# Patient Record
Sex: Female | Born: 1955
Health system: Southern US, Community
[De-identification: ages and names within clinical notes are randomized; demographics above are authoritative.]

## PROBLEM LIST (undated history)

## (undated) DIAGNOSIS — I639 Cerebral infarction, unspecified: Secondary | ICD-10-CM

## (undated) DIAGNOSIS — I2510 Atherosclerotic heart disease of native coronary artery without angina pectoris: Secondary | ICD-10-CM

## (undated) DIAGNOSIS — E7800 Pure hypercholesterolemia, unspecified: Secondary | ICD-10-CM

## (undated) DIAGNOSIS — R531 Weakness: Secondary | ICD-10-CM

## (undated) DIAGNOSIS — E119 Type 2 diabetes mellitus without complications: Secondary | ICD-10-CM

## (undated) DIAGNOSIS — F419 Anxiety disorder, unspecified: Secondary | ICD-10-CM

## (undated) DIAGNOSIS — I359 Nonrheumatic aortic valve disorder, unspecified: Secondary | ICD-10-CM

## (undated) DIAGNOSIS — I10 Essential (primary) hypertension: Secondary | ICD-10-CM

---

## 2007-02-24 HISTORY — PX: AORTIC VALVE REPLACEMENT (AVR)/CORONARY ARTERY BYPASS GRAFTING (CABG): SHX5725

## 2010-09-30 DIAGNOSIS — F32A Depression, unspecified: Secondary | ICD-10-CM | POA: Insufficient documentation

## 2010-09-30 DIAGNOSIS — D860 Sarcoidosis of lung: Secondary | ICD-10-CM | POA: Insufficient documentation

## 2010-10-01 DIAGNOSIS — I2510 Atherosclerotic heart disease of native coronary artery without angina pectoris: Secondary | ICD-10-CM | POA: Insufficient documentation

## 2010-10-01 DIAGNOSIS — H269 Unspecified cataract: Secondary | ICD-10-CM | POA: Insufficient documentation

## 2010-10-01 DIAGNOSIS — E785 Hyperlipidemia, unspecified: Secondary | ICD-10-CM | POA: Insufficient documentation

## 2011-06-17 DIAGNOSIS — K224 Dyskinesia of esophagus: Secondary | ICD-10-CM | POA: Insufficient documentation

## 2011-06-17 DIAGNOSIS — E11319 Type 2 diabetes mellitus with unspecified diabetic retinopathy without macular edema: Secondary | ICD-10-CM | POA: Insufficient documentation

## 2012-02-29 DIAGNOSIS — M19041 Primary osteoarthritis, right hand: Secondary | ICD-10-CM | POA: Insufficient documentation

## 2012-11-26 DIAGNOSIS — E669 Obesity, unspecified: Secondary | ICD-10-CM | POA: Insufficient documentation

## 2014-03-28 DIAGNOSIS — I5022 Chronic systolic (congestive) heart failure: Secondary | ICD-10-CM | POA: Insufficient documentation

## 2014-04-15 DIAGNOSIS — D509 Iron deficiency anemia, unspecified: Secondary | ICD-10-CM | POA: Insufficient documentation

## 2016-01-09 DIAGNOSIS — N1831 Chronic kidney disease, stage 3a: Secondary | ICD-10-CM | POA: Insufficient documentation

## 2016-01-09 DIAGNOSIS — N183 Chronic kidney disease, stage 3 unspecified: Secondary | ICD-10-CM | POA: Insufficient documentation

## 2020-01-24 DIAGNOSIS — I639 Cerebral infarction, unspecified: Secondary | ICD-10-CM

## 2020-01-24 HISTORY — DX: Cerebral infarction, unspecified: I63.9

## 2020-02-01 ENCOUNTER — Emergency Department: Payer: BLUE CROSS/BLUE SHIELD

## 2020-02-01 ENCOUNTER — Other Ambulatory Visit: Payer: Self-pay

## 2020-02-01 DIAGNOSIS — Z8249 Family history of ischemic heart disease and other diseases of the circulatory system: Secondary | ICD-10-CM

## 2020-02-01 DIAGNOSIS — R29810 Facial weakness: Secondary | ICD-10-CM | POA: Diagnosis present

## 2020-02-01 DIAGNOSIS — Z794 Long term (current) use of insulin: Secondary | ICD-10-CM

## 2020-02-01 DIAGNOSIS — F32A Depression, unspecified: Secondary | ICD-10-CM | POA: Diagnosis present

## 2020-02-01 DIAGNOSIS — Z7982 Long term (current) use of aspirin: Secondary | ICD-10-CM

## 2020-02-01 DIAGNOSIS — Z79899 Other long term (current) drug therapy: Secondary | ICD-10-CM

## 2020-02-01 DIAGNOSIS — E860 Dehydration: Secondary | ICD-10-CM | POA: Diagnosis present

## 2020-02-01 DIAGNOSIS — I2510 Atherosclerotic heart disease of native coronary artery without angina pectoris: Secondary | ICD-10-CM | POA: Diagnosis present

## 2020-02-01 DIAGNOSIS — E7800 Pure hypercholesterolemia, unspecified: Secondary | ICD-10-CM | POA: Diagnosis present

## 2020-02-01 DIAGNOSIS — E119 Type 2 diabetes mellitus without complications: Secondary | ICD-10-CM | POA: Diagnosis present

## 2020-02-01 DIAGNOSIS — I672 Cerebral atherosclerosis: Secondary | ICD-10-CM | POA: Diagnosis present

## 2020-02-01 DIAGNOSIS — I6521 Occlusion and stenosis of right carotid artery: Secondary | ICD-10-CM | POA: Diagnosis present

## 2020-02-01 DIAGNOSIS — D869 Sarcoidosis, unspecified: Secondary | ICD-10-CM | POA: Diagnosis present

## 2020-02-01 DIAGNOSIS — I639 Cerebral infarction, unspecified: Secondary | ICD-10-CM | POA: Diagnosis not present

## 2020-02-01 DIAGNOSIS — Z952 Presence of prosthetic heart valve: Secondary | ICD-10-CM

## 2020-02-01 DIAGNOSIS — E785 Hyperlipidemia, unspecified: Secondary | ICD-10-CM | POA: Diagnosis present

## 2020-02-01 DIAGNOSIS — I119 Hypertensive heart disease without heart failure: Secondary | ICD-10-CM | POA: Diagnosis present

## 2020-02-01 DIAGNOSIS — R29701 NIHSS score 1: Secondary | ICD-10-CM | POA: Diagnosis present

## 2020-02-01 DIAGNOSIS — Z20822 Contact with and (suspected) exposure to covid-19: Secondary | ICD-10-CM | POA: Diagnosis present

## 2020-02-01 DIAGNOSIS — N390 Urinary tract infection, site not specified: Secondary | ICD-10-CM | POA: Diagnosis present

## 2020-02-01 DIAGNOSIS — Z888 Allergy status to other drugs, medicaments and biological substances status: Secondary | ICD-10-CM

## 2020-02-01 DIAGNOSIS — Z951 Presence of aortocoronary bypass graft: Secondary | ICD-10-CM

## 2020-02-01 DIAGNOSIS — N179 Acute kidney failure, unspecified: Secondary | ICD-10-CM | POA: Diagnosis present

## 2020-02-01 DIAGNOSIS — G8324 Monoplegia of upper limb affecting left nondominant side: Secondary | ICD-10-CM | POA: Diagnosis present

## 2020-02-01 IMAGING — CR DG CHEST 1V
1 series · 1 of 1 positions shown · non-contrast
Comparison: None.

CLINICAL DATA: Weakness

EXAM:
CHEST  1 VIEW

[chest ap]
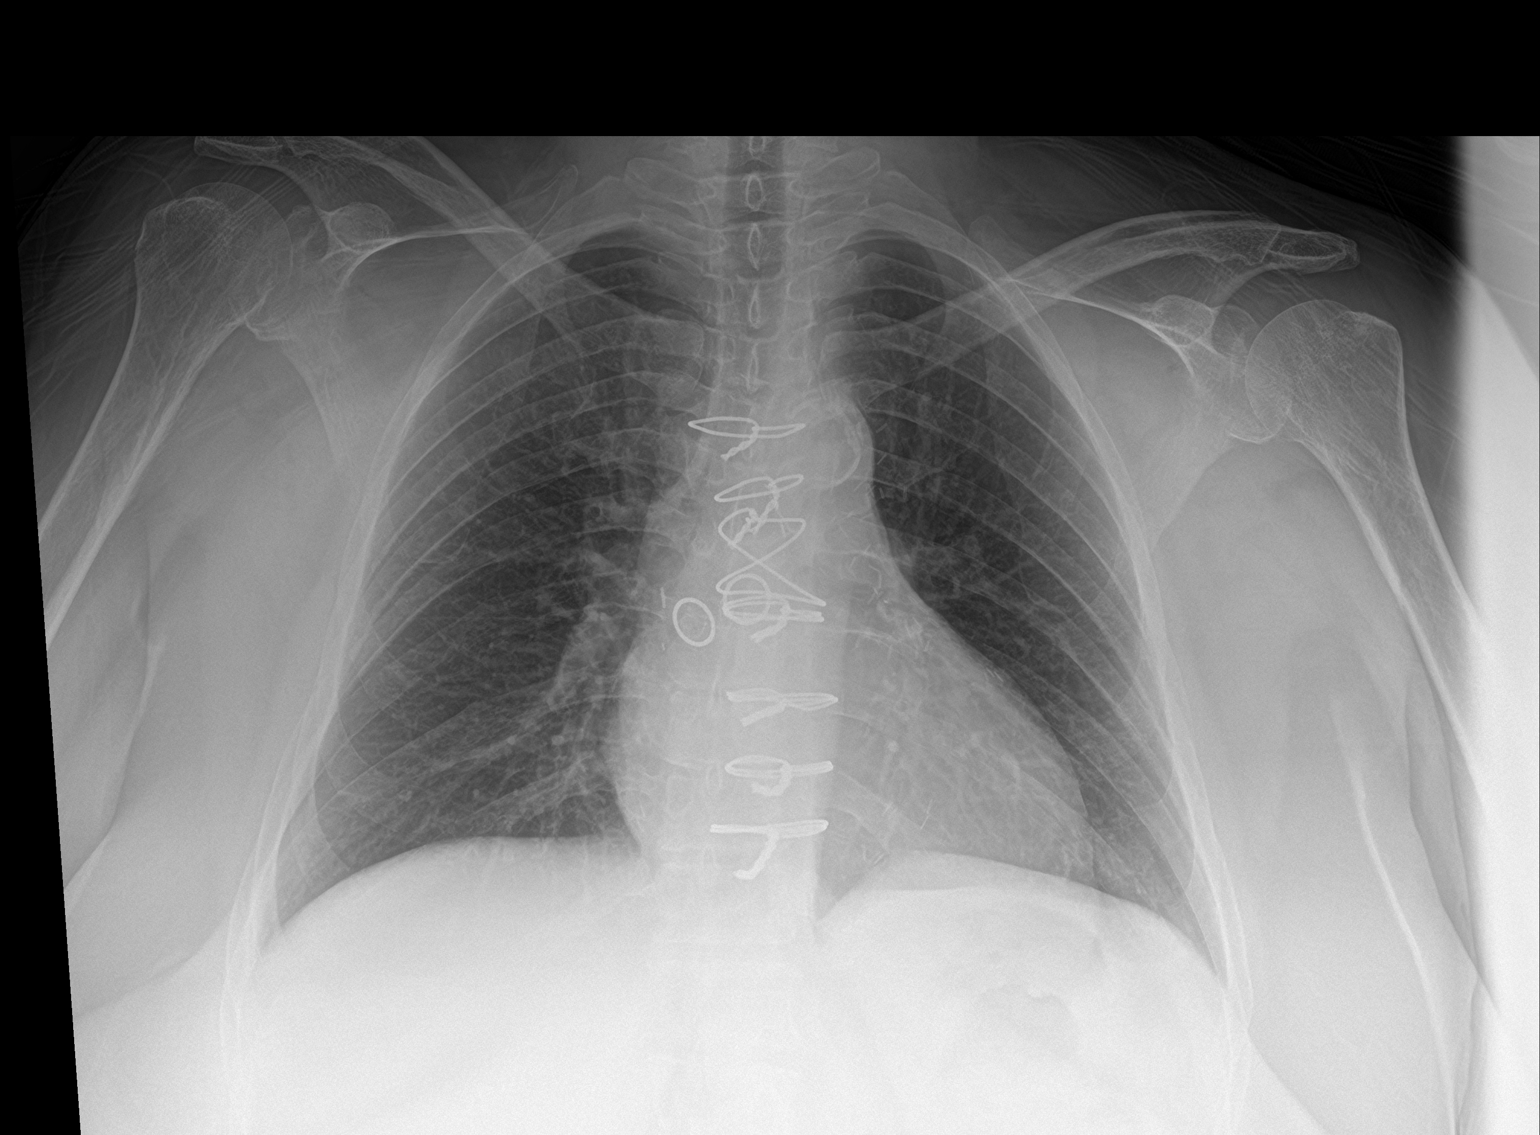

[1 of 1 positions shown; findings below may reference images not displayed]

FINDINGS: The heart size and mediastinal contours are within normal limits.
Aortic knob calcifications are seen. Overlying median sternotomy
wires are present. Both lungs are clear. The visualized skeletal
structures are unremarkable.
IMPRESSION: No active disease.

## 2020-02-01 MED ORDER — SODIUM CHLORIDE 0.9% FLUSH
3.0000 mL | Freq: Once | INTRAVENOUS | Status: DC
Start: 2020-02-01 — End: 2020-02-06

## 2020-02-01 NOTE — ED Triage Notes (Signed)
EMS brings pt in from home for c/o weakness and tingling to left arm since yesterday

## 2020-02-01 NOTE — ED Triage Notes (Signed)
PT to ED via EMS from home. C/o L arm tingling and weakness since yesterday morning. PT is alert and oriented, speech clear. No leg drop, but L arm drops with gravity and grip is decreased. No HX of same.

## 2020-02-02 ENCOUNTER — Encounter: Payer: Self-pay | Admitting: Internal Medicine

## 2020-02-02 ENCOUNTER — Emergency Department: Payer: BLUE CROSS/BLUE SHIELD

## 2020-02-02 ENCOUNTER — Observation Stay: Payer: BLUE CROSS/BLUE SHIELD

## 2020-02-02 ENCOUNTER — Inpatient Hospital Stay
Admission: EM | Admit: 2020-02-02 | Discharge: 2020-02-06 | DRG: 065 | Disposition: A | Discharge status: Home / Self Care | Payer: BLUE CROSS/BLUE SHIELD | Attending: Internal Medicine | Admitting: Internal Medicine

## 2020-02-02 DIAGNOSIS — N179 Acute kidney failure, unspecified: Secondary | ICD-10-CM | POA: Diagnosis not present

## 2020-02-02 DIAGNOSIS — N390 Urinary tract infection, site not specified: Secondary | ICD-10-CM

## 2020-02-02 DIAGNOSIS — E119 Type 2 diabetes mellitus without complications: Secondary | ICD-10-CM

## 2020-02-02 DIAGNOSIS — I639 Cerebral infarction, unspecified: Secondary | ICD-10-CM | POA: Diagnosis present

## 2020-02-02 DIAGNOSIS — I779 Disorder of arteries and arterioles, unspecified: Secondary | ICD-10-CM

## 2020-02-02 DIAGNOSIS — G459 Transient cerebral ischemic attack, unspecified: Secondary | ICD-10-CM | POA: Diagnosis present

## 2020-02-02 DIAGNOSIS — R531 Weakness: Secondary | ICD-10-CM

## 2020-02-02 DIAGNOSIS — I10 Essential (primary) hypertension: Secondary | ICD-10-CM | POA: Diagnosis present

## 2020-02-02 DIAGNOSIS — E860 Dehydration: Secondary | ICD-10-CM

## 2020-02-02 HISTORY — DX: Type 2 diabetes mellitus without complications: E11.9

## 2020-02-02 HISTORY — DX: Essential (primary) hypertension: I10

## 2020-02-02 HISTORY — DX: Pure hypercholesterolemia, unspecified: E78.00

## 2020-02-02 LAB — COMPREHENSIVE METABOLIC PANEL
ALT: 21 U/L (ref 0–44)
AST: 23 U/L (ref 15–41)
Albumin: 4.1 g/dL (ref 3.5–5.0)
Alkaline Phosphatase: 76 U/L (ref 38–126)
Anion gap: 11 (ref 5–15)
BUN: 39 mg/dL — ABNORMAL HIGH (ref 8–23)
CO2: 25 mmol/L (ref 22–32)
Calcium: 9.4 mg/dL (ref 8.9–10.3)
Chloride: 99 mmol/L (ref 98–111)
Creatinine, Ser: 2.42 mg/dL — ABNORMAL HIGH (ref 0.44–1.00)
GFR, Estimated: 22 mL/min — ABNORMAL LOW (ref >60)
Glucose, Bld: 155 mg/dL — ABNORMAL HIGH (ref 70–99)
Potassium: 4.5 mmol/L (ref 3.5–5.1)
Sodium: 135 mmol/L (ref 135–145)
Total Bilirubin: 0.5 mg/dL (ref 0.3–1.2)
Total Protein: 8 g/dL (ref 6.5–8.1)

## 2020-02-02 LAB — URINALYSIS, COMPLETE (UACMP) WITH MICROSCOPIC
Bilirubin Urine: NEGATIVE
Glucose, UA: 50 mg/dL — AB
Ketones, ur: NEGATIVE mg/dL
Nitrite: POSITIVE — AB
Protein, ur: NEGATIVE mg/dL
Specific Gravity, Urine: 1.009 (ref 1.005–1.030)
pH: 6 (ref 5.0–8.0)

## 2020-02-02 LAB — DIFFERENTIAL
Abs Immature Granulocytes: 0.01 10*3/uL (ref 0.00–0.07)
Basophils Absolute: 0.1 10*3/uL (ref 0.0–0.1)
Basophils Relative: 1 %
Eosinophils Absolute: 0.1 10*3/uL (ref 0.0–0.5)
Eosinophils Relative: 1 %
Immature Granulocytes: 0 %
Lymphocytes Relative: 25 %
Lymphs Abs: 1.6 10*3/uL (ref 0.7–4.0)
Monocytes Absolute: 0.6 10*3/uL (ref 0.1–1.0)
Monocytes Relative: 10 %
Neutro Abs: 3.9 10*3/uL (ref 1.7–7.7)
Neutrophils Relative %: 63 %

## 2020-02-02 LAB — CBC
HCT: 38.1 % (ref 36.0–46.0)
Hemoglobin: 12.6 g/dL (ref 12.0–15.0)
MCH: 26.3 pg (ref 26.0–34.0)
MCHC: 33.1 g/dL (ref 30.0–36.0)
MCV: 79.5 fL — ABNORMAL LOW (ref 80.0–100.0)
Platelets: 331 10*3/uL (ref 150–400)
RBC: 4.79 MIL/uL (ref 3.87–5.11)
RDW: 12.6 % (ref 11.5–15.5)
WBC: 6.3 10*3/uL (ref 4.0–10.5)
nRBC: 0 % (ref 0.0–0.2)

## 2020-02-02 LAB — GLUCOSE, CAPILLARY
Glucose-Capillary: 135 mg/dL — ABNORMAL HIGH (ref 70–99)
Glucose-Capillary: 104 mg/dL — ABNORMAL HIGH (ref 70–99)

## 2020-02-02 LAB — RESP PANEL BY RT-PCR (FLU A&B, COVID) ARPGX2
Influenza A by PCR: NEGATIVE
Influenza B by PCR: NEGATIVE
SARS Coronavirus 2 by RT PCR: NEGATIVE

## 2020-02-02 LAB — HEMOGLOBIN A1C
Hgb A1c MFr Bld: 9.4 % — ABNORMAL HIGH (ref 4.8–5.6)
Mean Plasma Glucose: 223.08 mg/dL

## 2020-02-02 LAB — CBG MONITORING, ED: Glucose-Capillary: 122 mg/dL — ABNORMAL HIGH (ref 70–99)

## 2020-02-02 LAB — APTT: aPTT: 31 seconds (ref 24–36)

## 2020-02-02 LAB — HIV ANTIBODY (ROUTINE TESTING W REFLEX): HIV Screen 4th Generation wRfx: NONREACTIVE

## 2020-02-02 LAB — PROTIME-INR
INR: 1 (ref 0.8–1.2)
Prothrombin Time: 13.1 seconds (ref 11.4–15.2)

## 2020-02-02 LAB — TROPONIN I (HIGH SENSITIVITY): Troponin I (High Sensitivity): 7 ng/L (ref <18)

## 2020-02-02 IMAGING — US US CAROTID DUPLEX BILAT
1 series · 13 of 24 positions shown · non-contrast
Comparison: None.

CLINICAL DATA: Carotid atherosclerosis, hypertension,
hyperlipidemia and diabetes

EXAM:
BILATERAL CAROTID DUPLEX ULTRASOUND
TECHNIQUE: Gray scale imaging, color Doppler and duplex ultrasound were
performed of bilateral carotid and vertebral arteries in the neck.

[Series 1: us carotid bilateral · 13 of 69 slices shown]
[im 1/69]
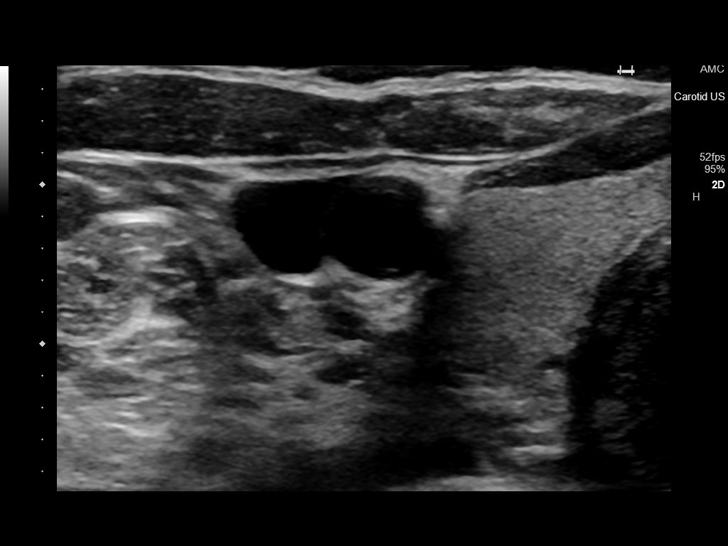
[im 6/69]
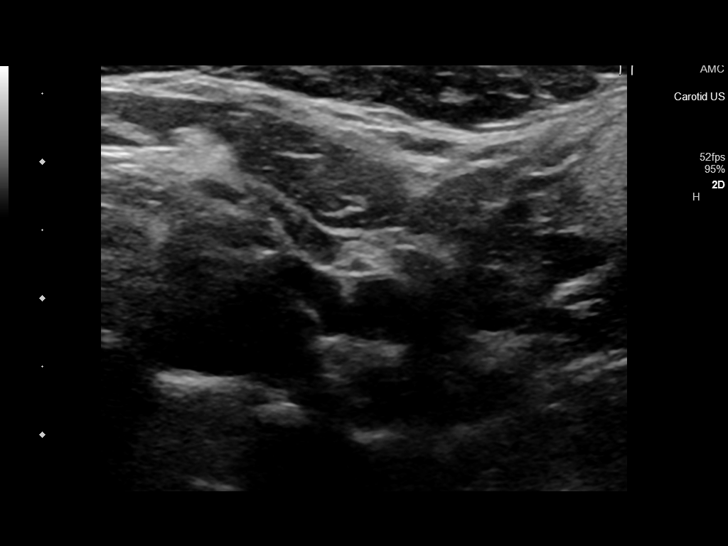
[im 12/69]
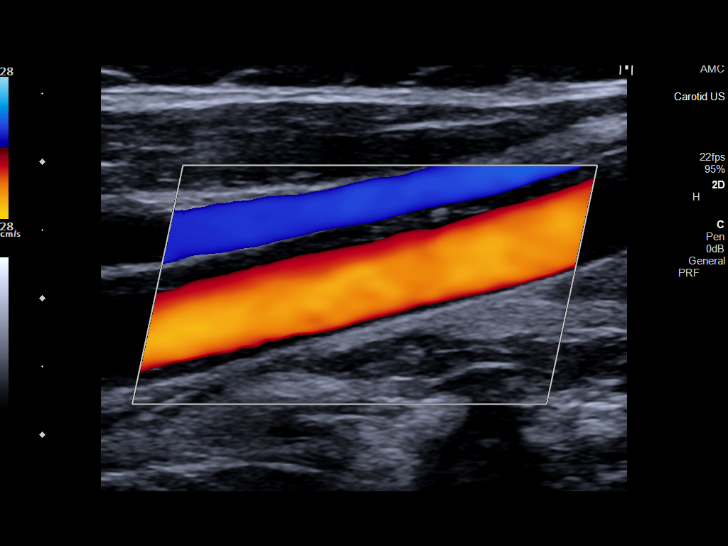
[im 18/69]
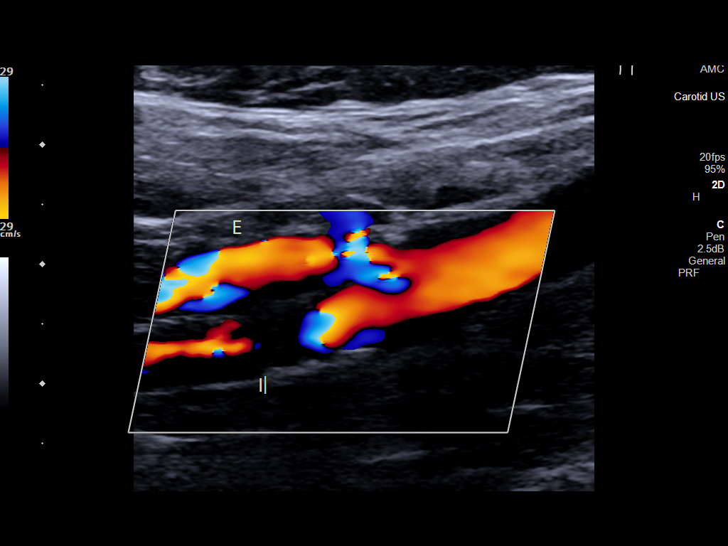
[im 24/69]
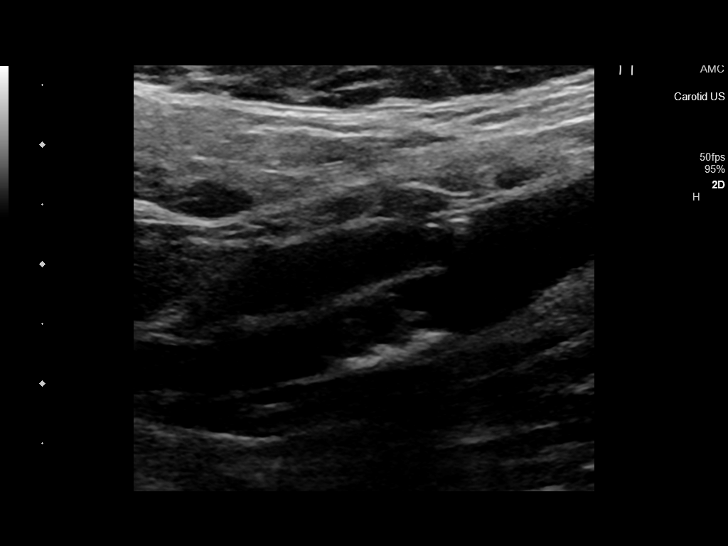
[im 30/69]
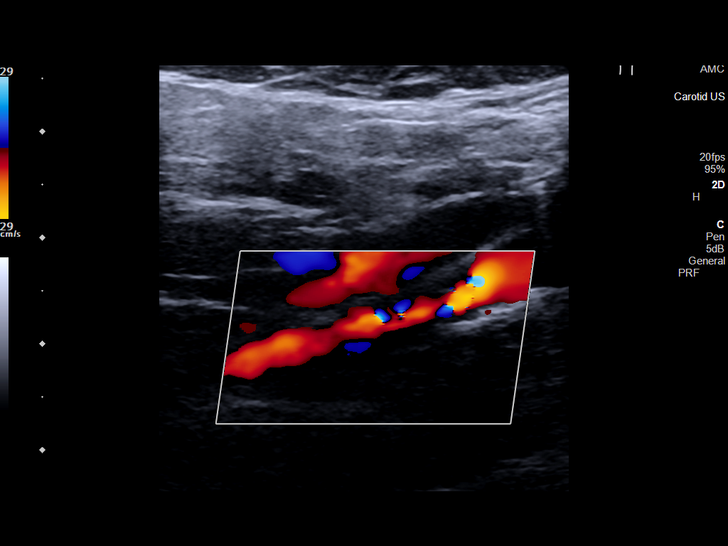
[im 36/69]
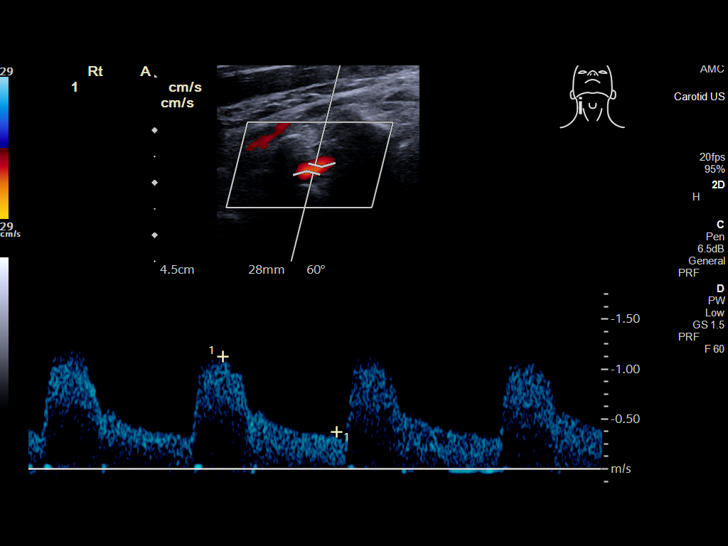
[im 39/69]
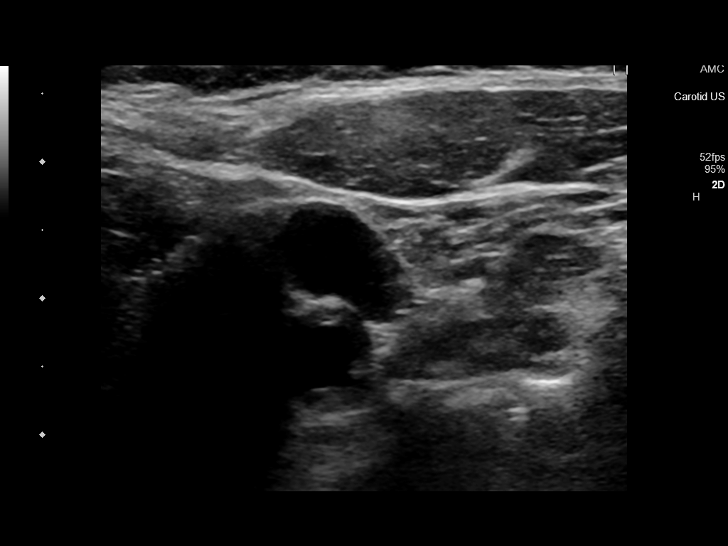
[im 45/69]
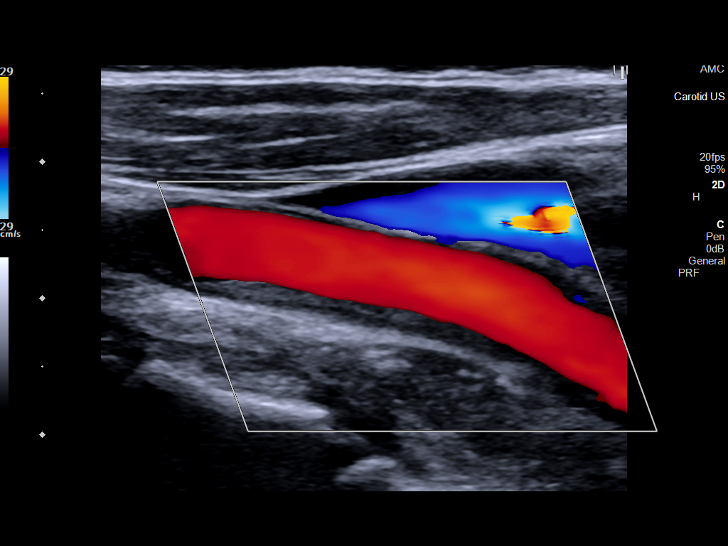
[im 51/69]
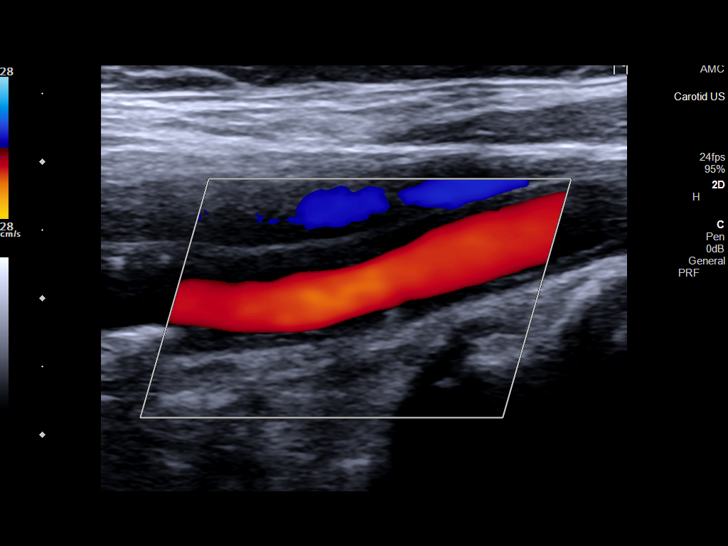
[im 57/69]
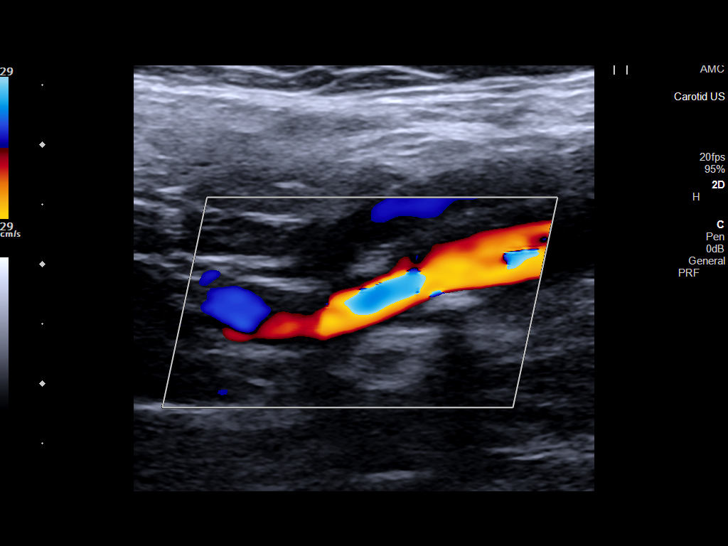
[im 63/69]
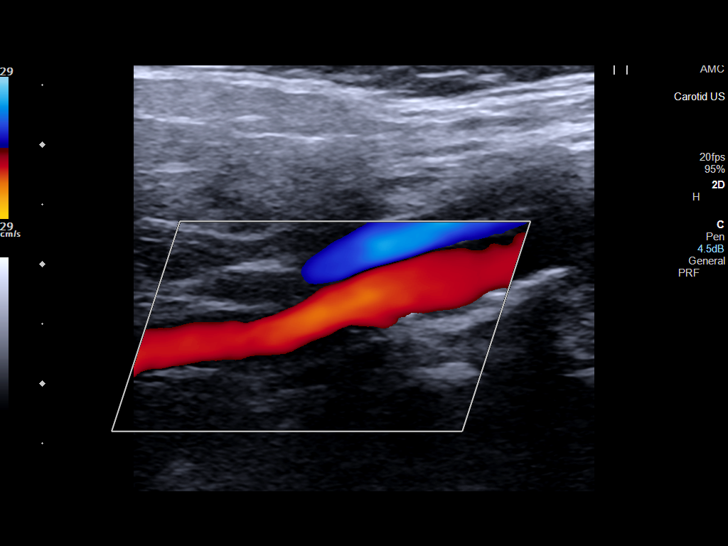
[im 69/69]
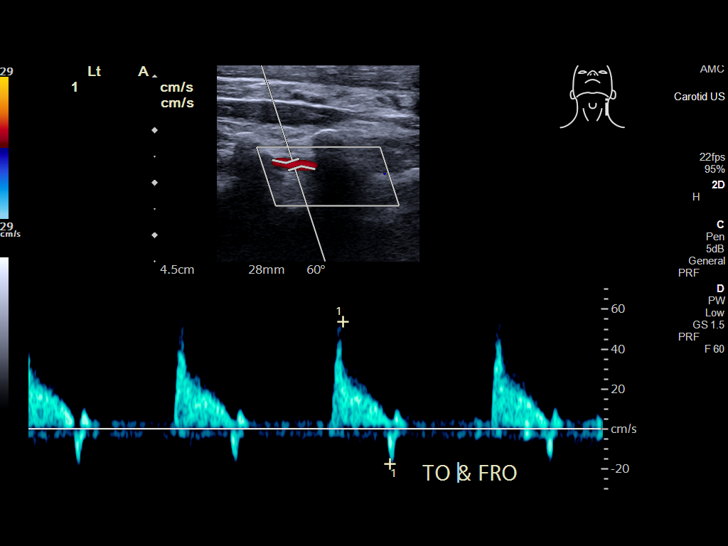

[13 of 24 positions shown; findings below may reference images not displayed]

FINDINGS: Criteria: Quantification of carotid stenosis is based on velocity
parameters that correlate the residual internal carotid diameter
with NASCET-based stenosis levels, using the diameter of the distal
internal carotid lumen as the denominator for stenosis measurement.

The following velocity measurements were obtained:

RIGHT

ICA: 583/224 cm/sec

CCA: 91/16 cm/sec

SYSTOLIC ICA/CCA RATIO:

ECA: 315 cm/sec

LEFT

ICA: 122/30 cm/sec

CCA: 131/26 cm/sec

SYSTOLIC ICA/CCA RATIO:

ECA: 183 cm/sec

RIGHT CAROTID ARTERY: Diffuse intimal thickening and moderate
carotid atherosclerosis. Luminal narrowing noted of the right
proximal ICA. In this region there is velocity elevation measuring
582/224 centimeters/second with turbulent flow and mild spectral
broadening. Right ICA stenosis estimated at greater than 70% by
ultrasound criteria.

RIGHT VERTEBRAL ARTERY:  Normal antegrade flow

LEFT CAROTID ARTERY: Similar intimal thickening and moderate
atherosclerotic change. Despite this, there is no hemodynamically
significant left ICA stenosis, velocity elevation, turbulent flow.
Degree of narrowing less than 50% by ultrasound criteria.

LEFT VERTEBRAL ARTERY: Remains antegrade with slight to and fro
flow, indicative of some degree of vascular disease.
IMPRESSION: Right ICA stenosis estimated at greater than 70% by ultrasound
criteria.

Left ICA stenosis less than 50%.

Antegrade vertebral flow bilaterally, see above comment.

## 2020-02-02 IMAGING — CT CT HEAD W/O CM
3 series · 15 of 44 positions shown, 18 images · non-contrast
Comparison: None.

CLINICAL DATA: Weakness and left arm tingling.

EXAM:
CT HEAD WITHOUT CONTRAST
TECHNIQUE: Contiguous axial images were obtained from the base of the skull
through the vertex without intravenous contrast.

[Series 2: head wo · axial · 0.41mm/px · z∈[-80,+30]mm · 9 of 27 slices shown, 12 images]
[im 3/27  brain]
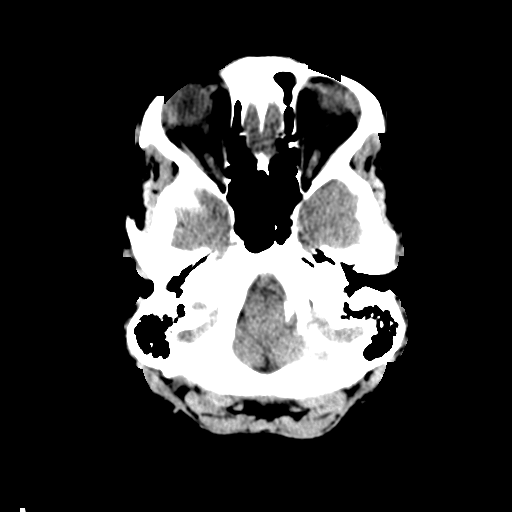
[im 3/27  bone]
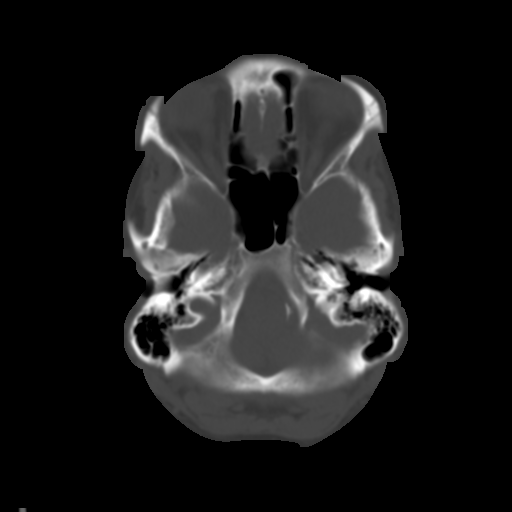
[im 6/27  brain]
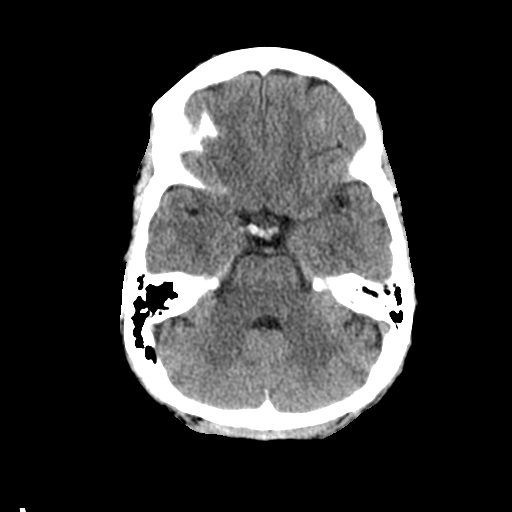
[im 8/27  brain]
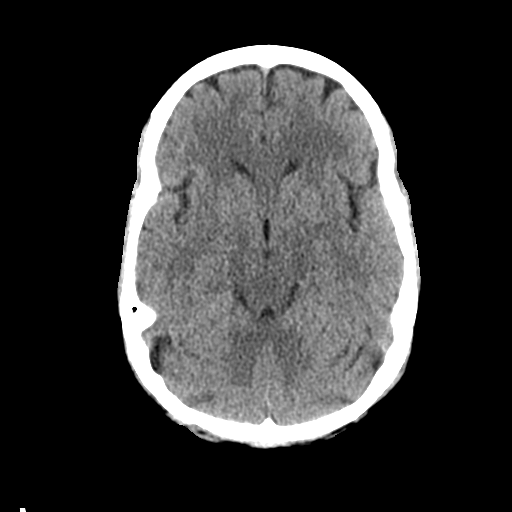
[im 11/27  brain]
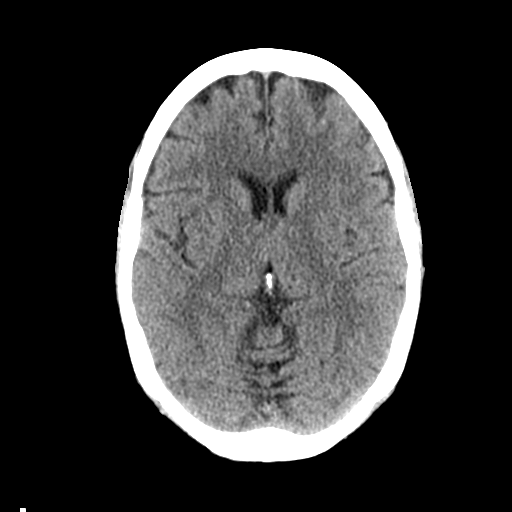
[im 14/27  brain]
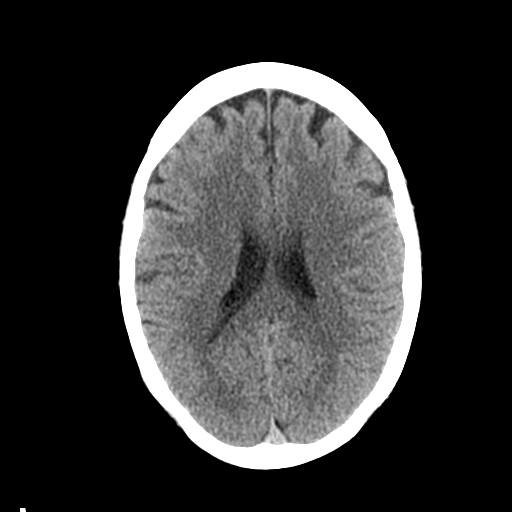
[im 14/27  bone]
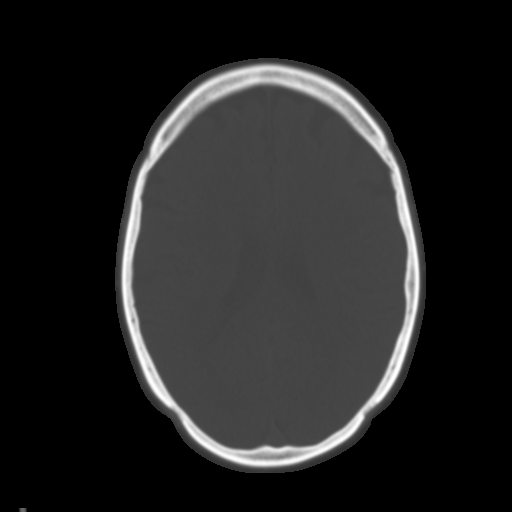
[im 17/27  brain]
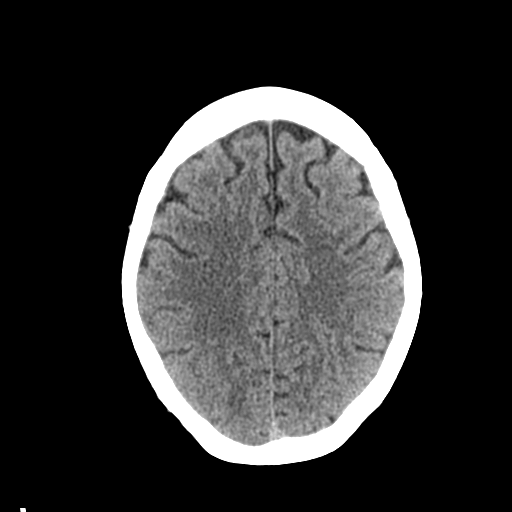
[im 20/27  brain]
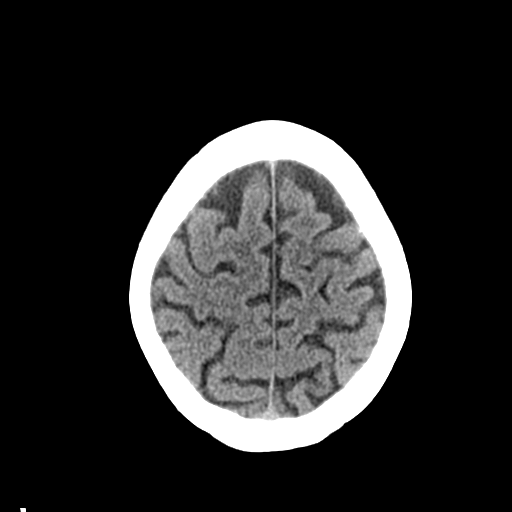
[im 22/27  brain]
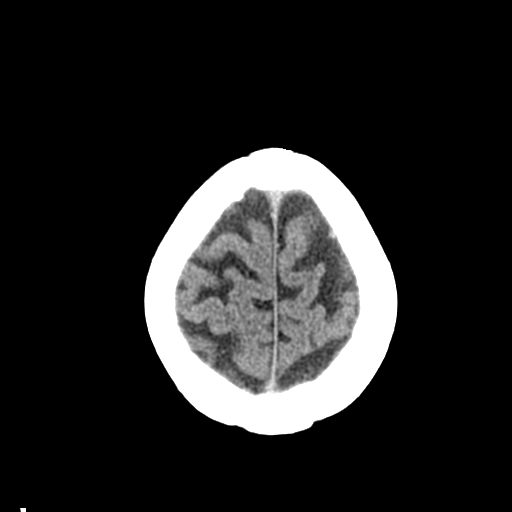
[im 25/27  brain]
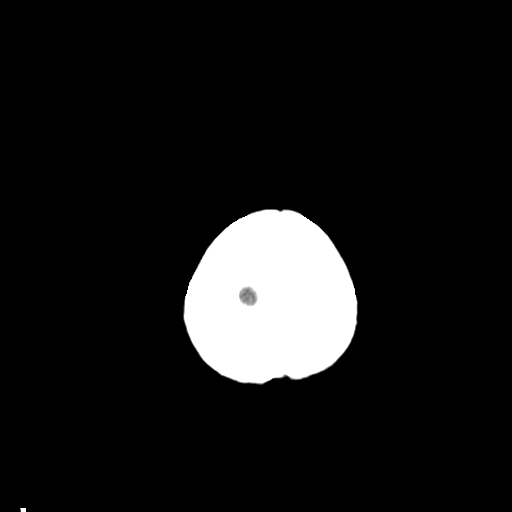
[im 25/27  bone]
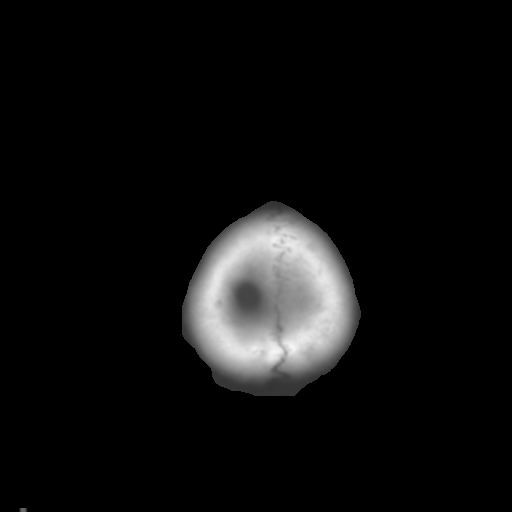

[Series 4: coronal soft tissue · coronal · 0.29mm/px · 3 of 59 slices shown]
[im 20/59  brain]
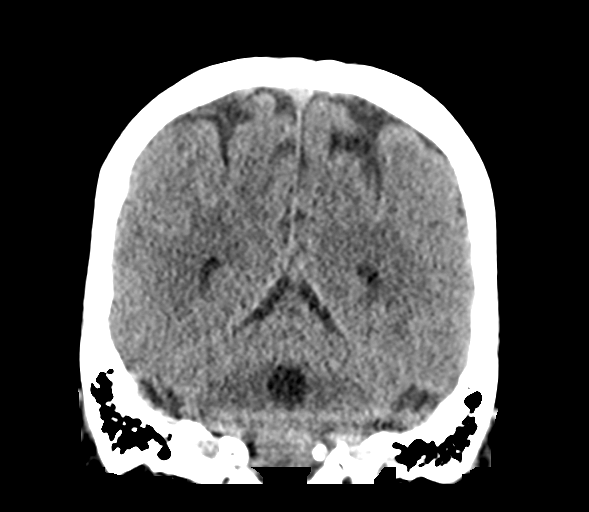
[im 26/59  brain]
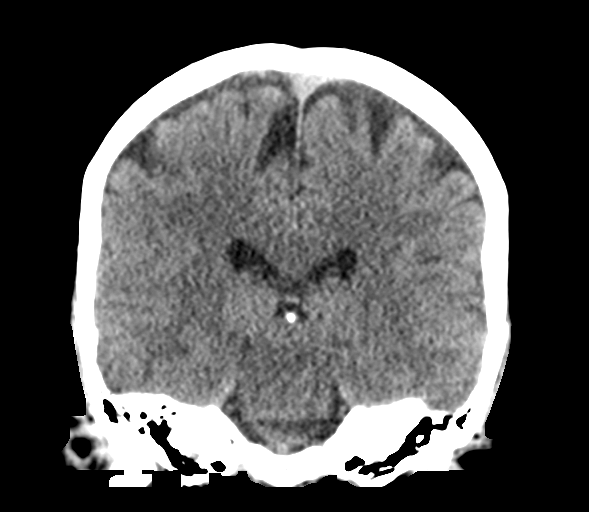
[im 33/59  brain]
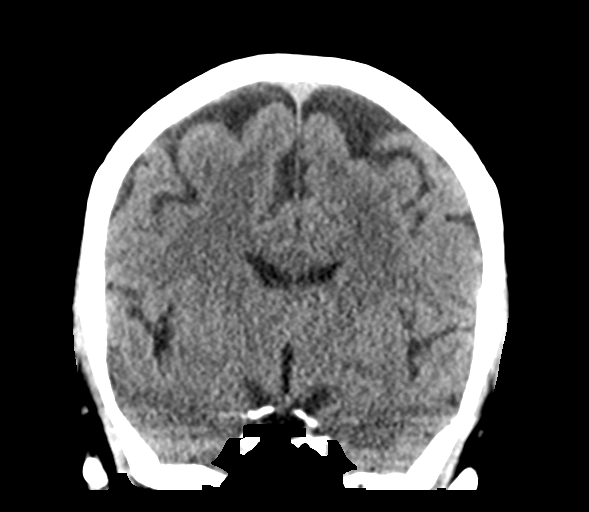

[Series 5: sagittal soft tissue · sagittal · 0.28mm/px · 3 of 47 slices shown]
[im 16/47  brain]
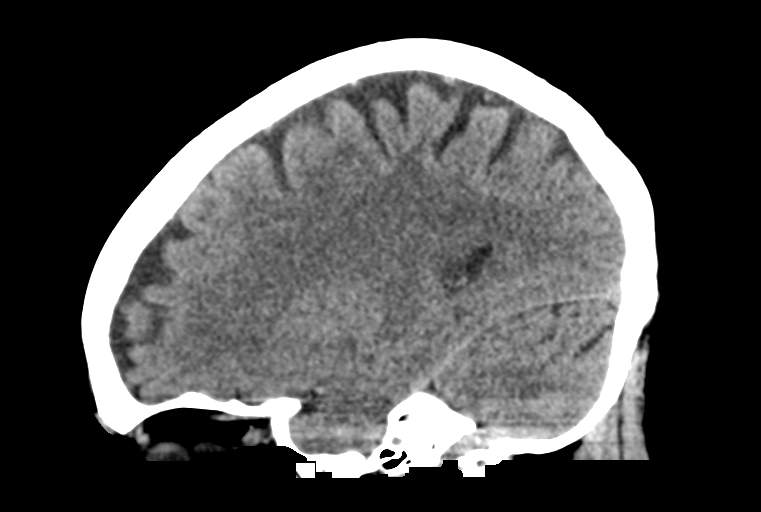
[im 24/47  brain]
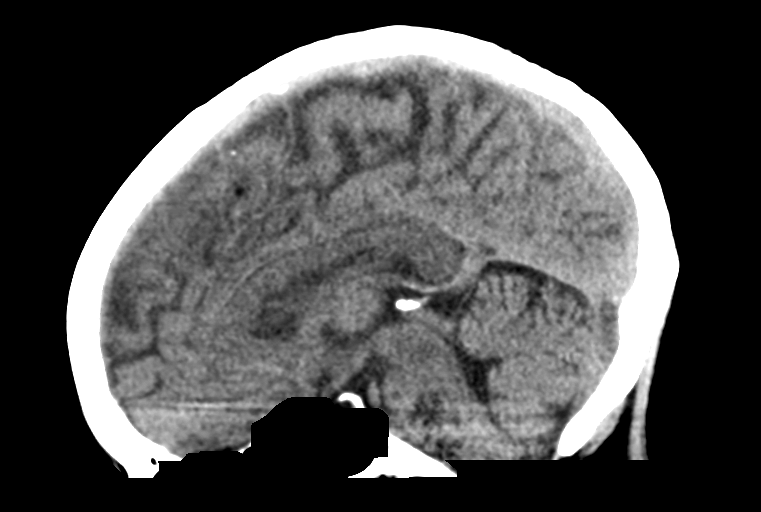
[im 31/47  brain]
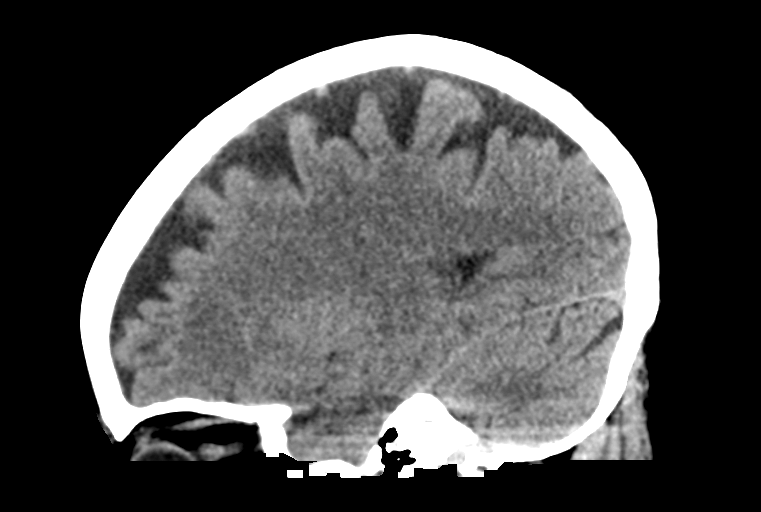

[15 of 44 positions shown; findings below may reference images not displayed]

FINDINGS: Brain: No evidence of acute infarction, hemorrhage, hydrocephalus,
extra-axial collection or mass lesion/mass effect.

Vascular: No hyperdense vessel or unexpected calcification.

Skull: Normal. Negative for fracture or focal lesion.

Sinuses/Orbits: No acute finding.

Other: None.
IMPRESSION: No acute intracranial pathology.

## 2020-02-02 IMAGING — MR MR HEAD W/O CM
10 of 13 series · 29 of 48 positions shown · non-contrast
Comparison: Head CT [DATE].

CLINICAL DATA: Neuro deficit, acute, stroke suspected. Additional
history provided: Left arm tingling with weakness since yesterday
morning.

EXAM:
MRI HEAD WITHOUT CONTRAST
MRA HEAD WITHOUT CONTRAST
TECHNIQUE: Multiplanar, multiecho pulse sequences of the brain and surrounding
structures were obtained without intravenous contrast. Angiographic
images of the head were obtained using MRA technique without
contrast.

[Series 5: ax dwi_tracew · axial · 3.0mm · 0.60mm/px · z∈[-99,+43]mm · 2 of 44 slices shown]
[im 1/44]
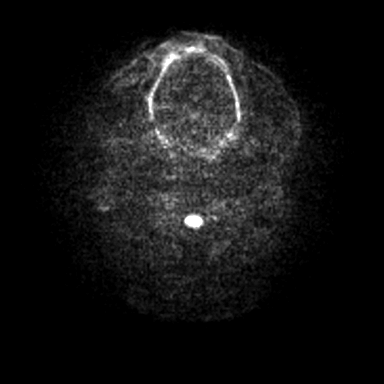
[im 44/44]
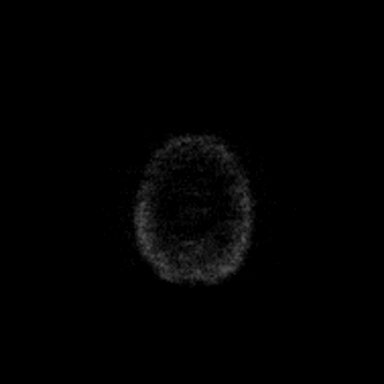

[Series 6: ax dwi_adc · axial · 3.0mm · 0.60mm/px · z∈[-99,+43]mm · 2 of 44 slices shown]
[im 1/44]
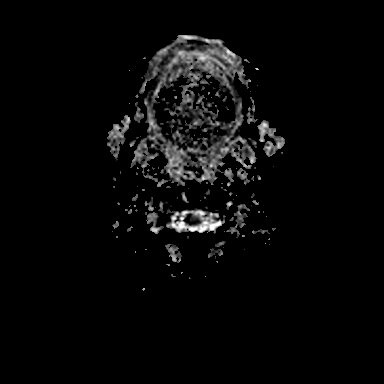
[im 44/44]
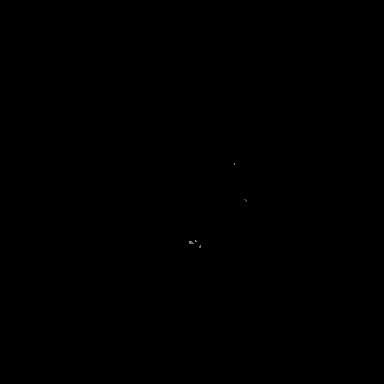

[Series 7: cor dwi_tracew · coronal · 5.0mm · 0.60mm/px · 2 of 34 slices shown]
[im 1/34]
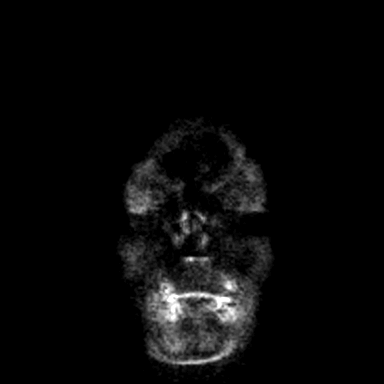
[im 34/34]
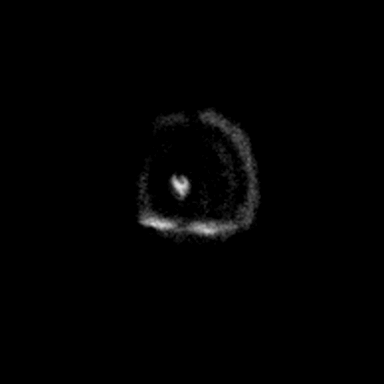

[Series 8: cor dwi_adc · coronal · 5.0mm · 0.60mm/px · 2 of 32 slices shown]
[im 1/32]
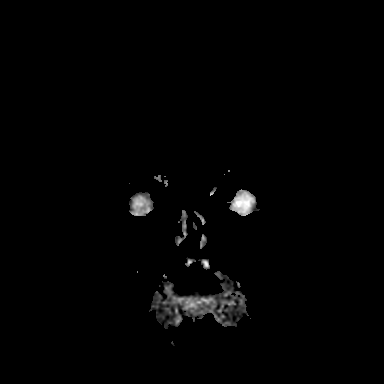
[im 32/32]
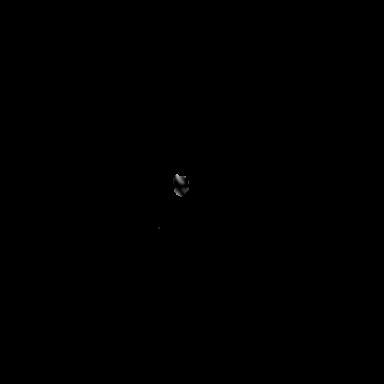

[Series 13: TOF · axial · 0.5mm · 0.41mm/px · z∈[-86,-12]mm · 6 of 205 slices shown]
[im 1/205]
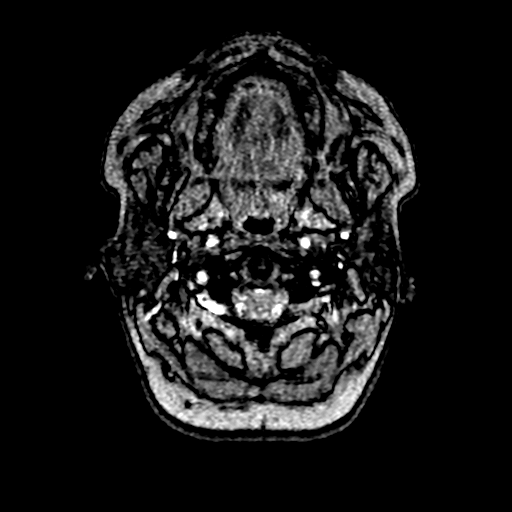
[im 38/205]
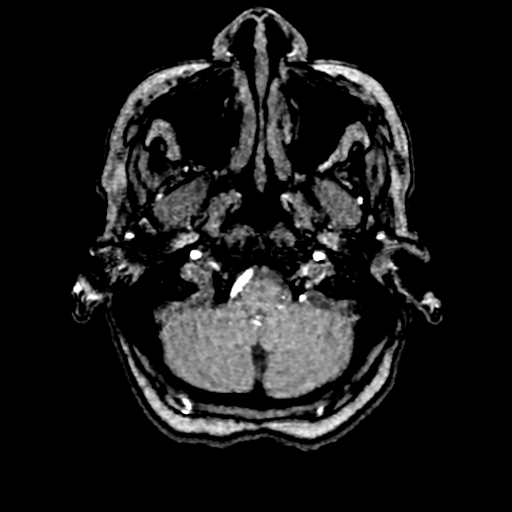
[im 56/205]
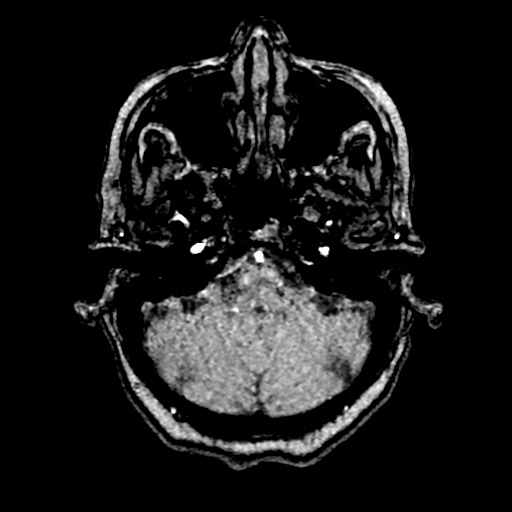
[im 93/205]
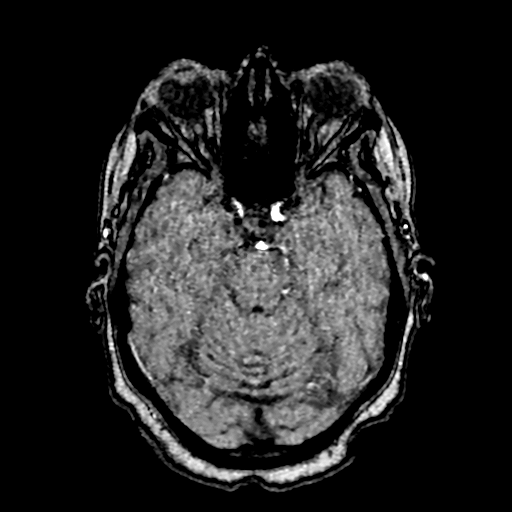
[im 112/205]
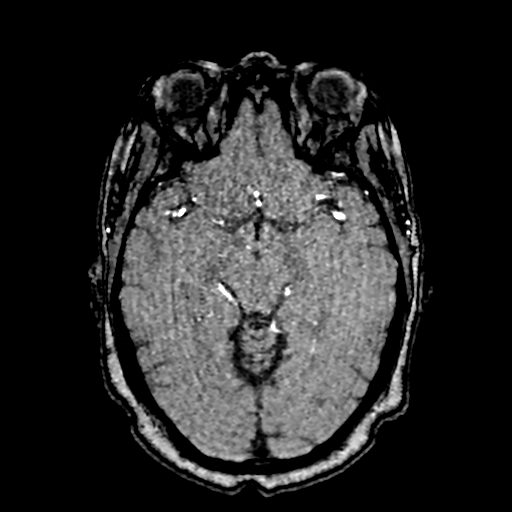
[im 149/205]
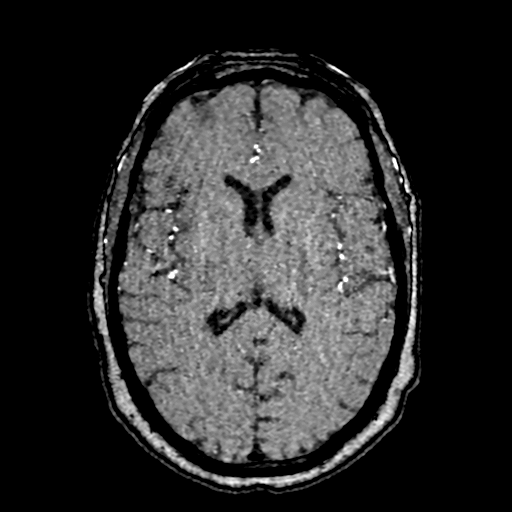

[Series 18: T1 · sagittal · 5.0mm · 0.62mm/px · 1 of 20 slices shown (1 of 2)]
[im 1/20]
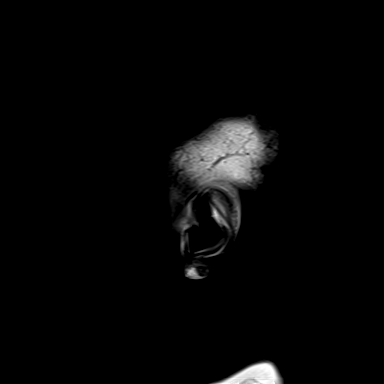

[Series 19: T2 · axial · 5.0mm · 0.53mm/px · 1 of 25 slices shown (1 of 2)]
[im 1/25]
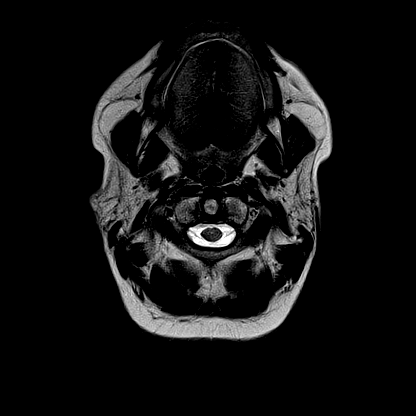

[Series 24: FLAIR · axial · 3.0mm · 0.53mm/px · z∈[-109,+53]mm · 3 of 55 slices shown]
[im 1/55]
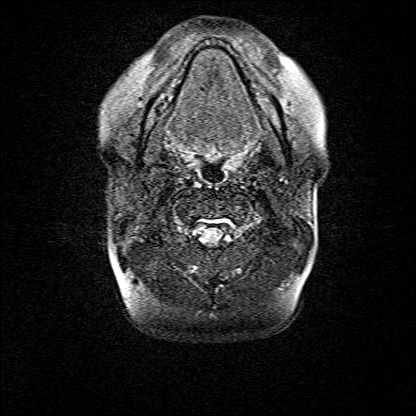
[im 28/55]
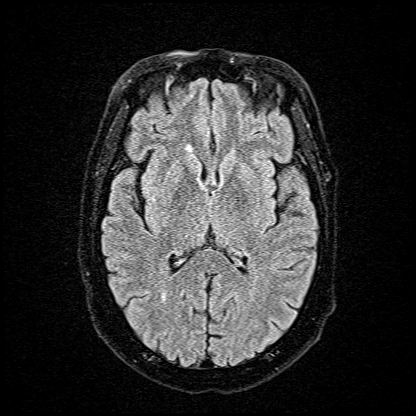
[im 55/55]
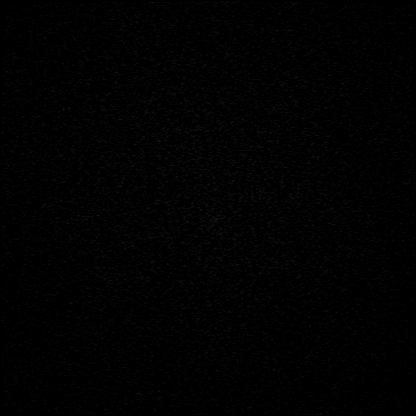

[Series 25: T1 · axial · 1.0mm · 0.98mm/px · z∈[-100,+42]mm · 8 of 144 slices shown (2 of 2)]
[im 1/144]
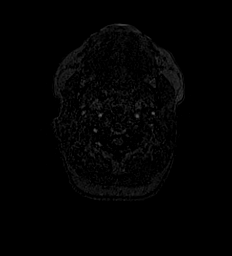
[im 18/144]
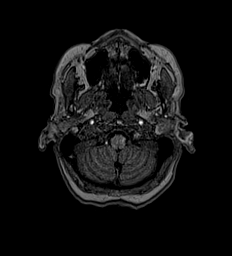
[im 36/144]
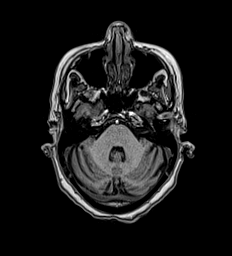
[im 54/144]
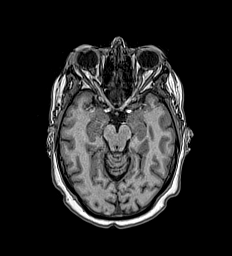
[im 90/144]
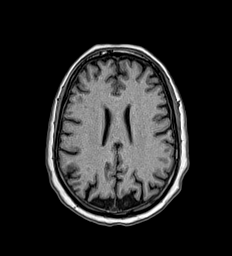
[im 108/144]
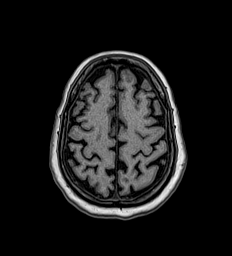
[im 126/144]
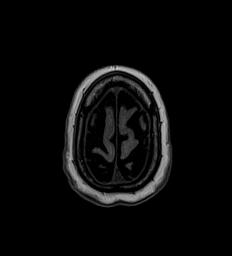
[im 144/144]
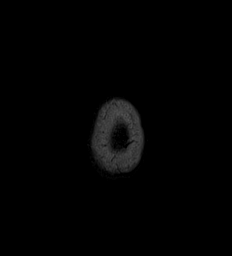

[Series 26: T2 · coronal · 5.0mm · 0.57mm/px · 2 of 27 slices shown (2 of 2)]
[im 1/27]
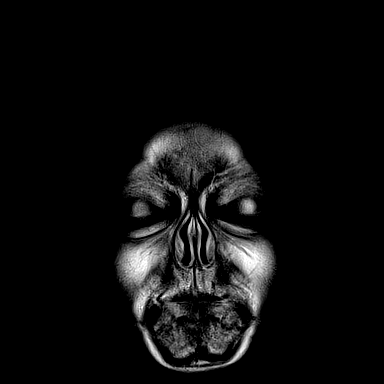
[im 27/27]
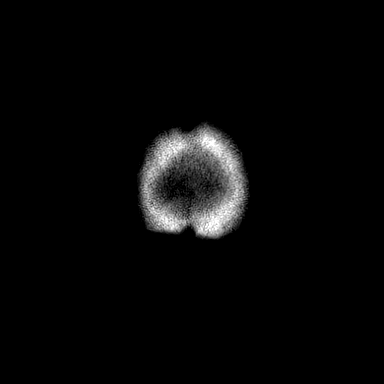

[29 of 48 positions shown; findings below may reference images not displayed]

FINDINGS: MRI HEAD FINDINGS

Brain:

Mild cerebral and cerebellar atrophy.

Subcentimeter acute cortical/subcortical infarcts within the
posterior right frontal lobe and right parietal lobe (involving the
pre and postcentral gyri). Additional punctate acute cortical
infarct more inferiorly and laterally within the right
parietooccipital lobes.

Mild multifocal T2/FLAIR hyperintensity within the cerebral white
matter is nonspecific, but compatible with chronic small vessel
ischemic disease.

No evidence of intracranial mass.

No chronic intracranial blood products.

No extra-axial fluid collection.

No midline shift.

Partially empty sella turcica.

Vascular: Reported below.

Skull and upper cervical spine: No focal marrow lesion.

Sinuses/Orbits: Visualized orbits show no acute finding. Prior lens
replacement on the left. No significant paranasal sinus disease.

MRA HEAD FINDINGS

The intracranial internal carotid arteries are patent. Apparent
severe stenoses within the proximal cavernous segments bilaterally
(series [CI], images 5 and 15). Stenoses of the cavernous ICAs more
distally (moderate right, mild left). The M1 middle cerebral
arteries are patent. No M2 proximal branch occlusion is identified.
Moderate stenosis within an inferior division proximal M2 right MCA
branch vessel (series [CI], image 13). The anterior cerebral
arteries are patent.

1-2 mm vascular protrusion arising from the pre-cavernous left ICA,
which may reflect atherosclerotic lobulation or a small aneurysm
(series 13, image 64) (series [CI], image 14).

Apparent moderate stenosis within the right vertebral artery at the
level of the skull base. The proximal V4 left vertebral artery is
poorly delineated, suggestive of high-grade stenosis or vessel
occlusion at this site. However, flow related signal is present
within the left PICA. The basilar artery is patent. The posterior
cerebral arteries are patent. Moderate/severe stenoses within the
proximal P2 posterior cerebral arteries bilaterally.
IMPRESSION: MRI brain:

1. Subcentimeter acute cortical/subcortical infarcts within the
posterior right frontal lobe and right parietal lobe (involving the
pre and postcentral gyri).
2. Additional punctate acute right parietooccipital cortical
infarct.
3. Mild cerebral atrophy and chronic small vessel ischemic disease.

MRA head:

1. Intracranial atherosclerotic disease with findings most notably
as follows.
2. The proximal V4 left vertebral artery is poorly delineated,
suggestive of high-grade stenosis or occlusion.
3. Moderate right vertebral artery stenosis at the level of the
skull base.
4. Moderate/severe stenoses within the proximal P2 posterior
cerebral arteries bilaterally.
5. Apparent severe stenoses within the proximal cavernous segments
of both intracranial internal carotid arteries. Moderate stenosis
within the cavernous right ICA more distally.
6. Moderate stenosis within an inferior division proximal M2 right
MCA vessel.
7. 1-2 mm vascular protrusion arising from the pre-avernous left ICA
which may reflect atherosclerotic lobulation or a small aneurysm.

## 2020-02-02 IMAGING — MR MR MRA HEAD W/O CM
1 series · 17 of 48 positions shown · non-contrast
Comparison: Head CT [DATE].

CLINICAL DATA: Neuro deficit, acute, stroke suspected. Additional
history provided: Left arm tingling with weakness since yesterday
morning.

EXAM:
MRI HEAD WITHOUT CONTRAST
MRA HEAD WITHOUT CONTRAST
TECHNIQUE: Multiplanar, multiecho pulse sequences of the brain and surrounding
structures were obtained without intravenous contrast. Angiographic
images of the head were obtained using MRA technique without
contrast.

[Series 13: TOF · axial · 0.5mm · 0.41mm/px · z∈[-86,+11]mm · 17 of 205 slices shown]
[im 1/205]
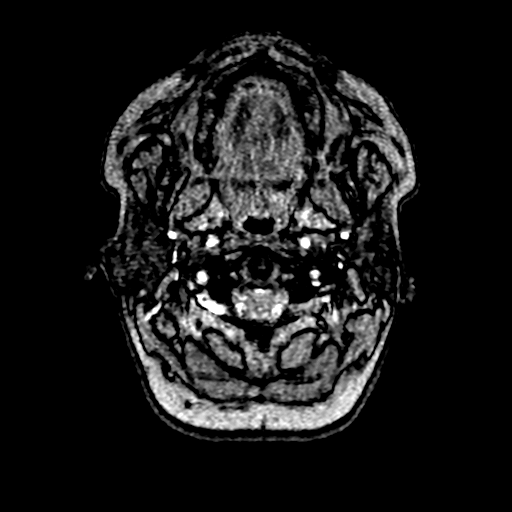
[im 5/205]
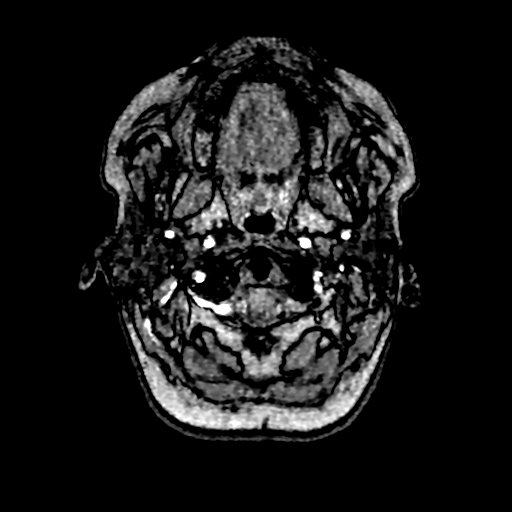
[im 9/205]
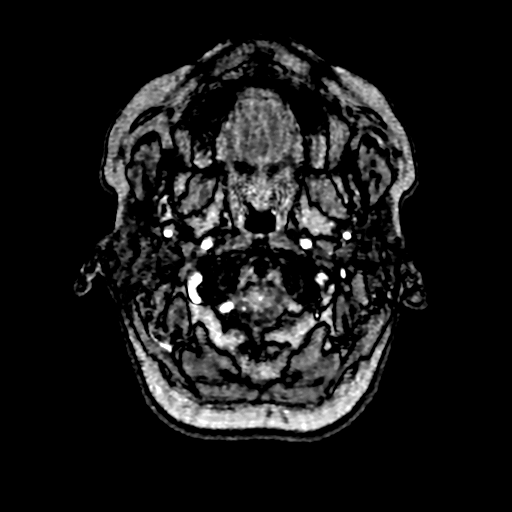
[im 14/205]
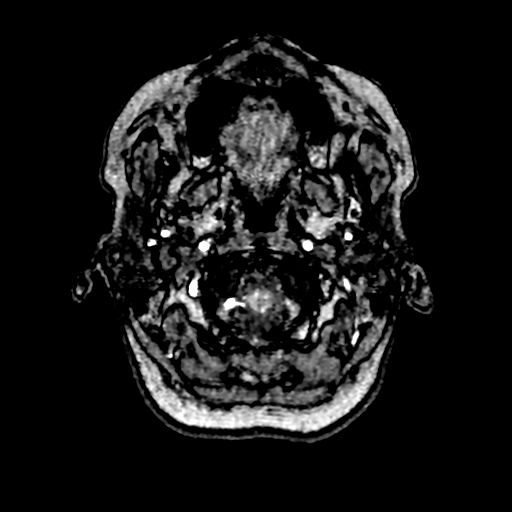
[im 18/205]
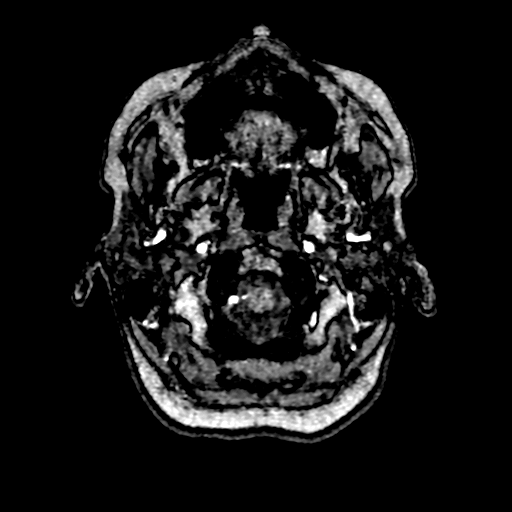
[im 22/205]
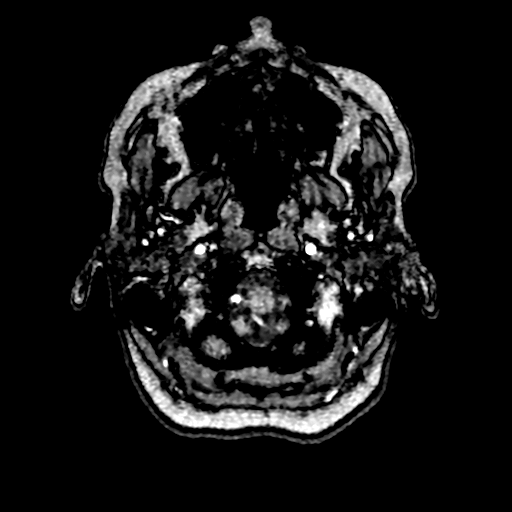
[im 27/205]
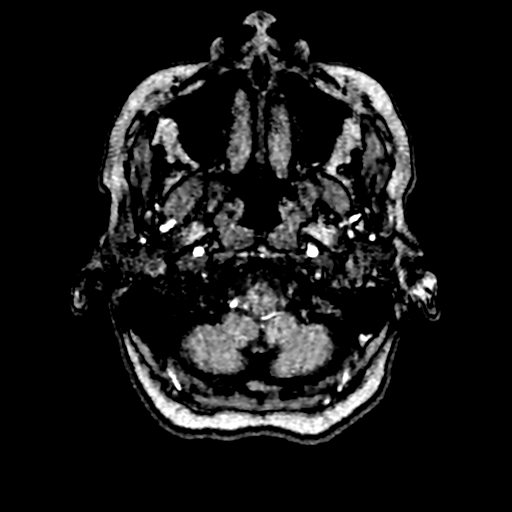
[im 35/205]
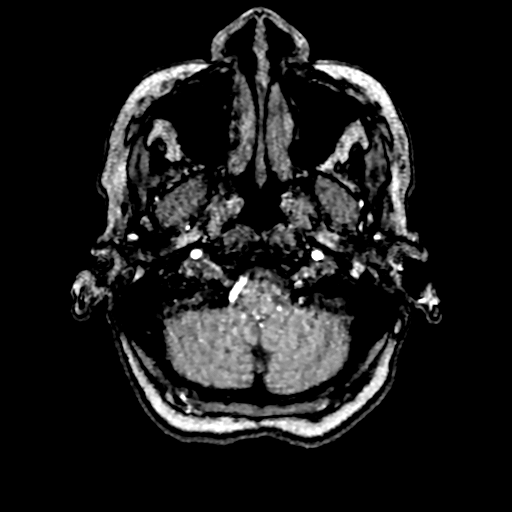
[im 40/205]
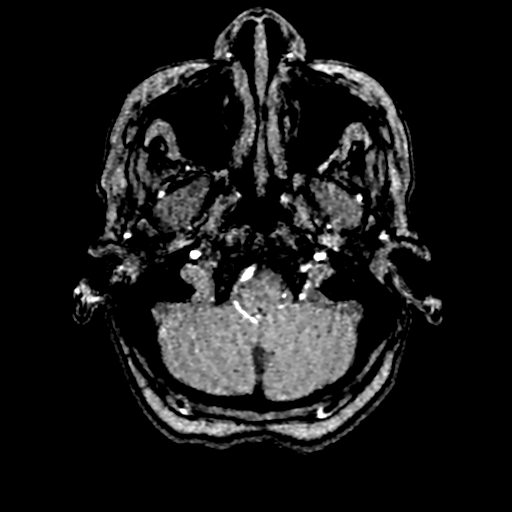
[im 66/205]
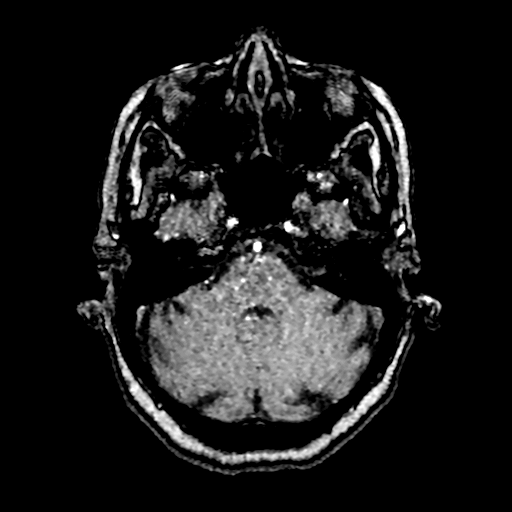
[im 92/205]
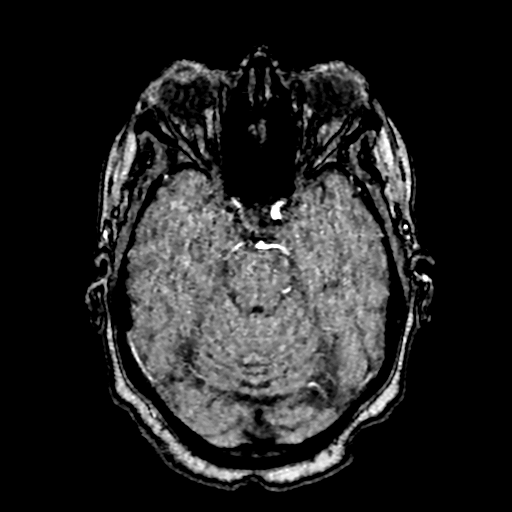
[im 105/205]
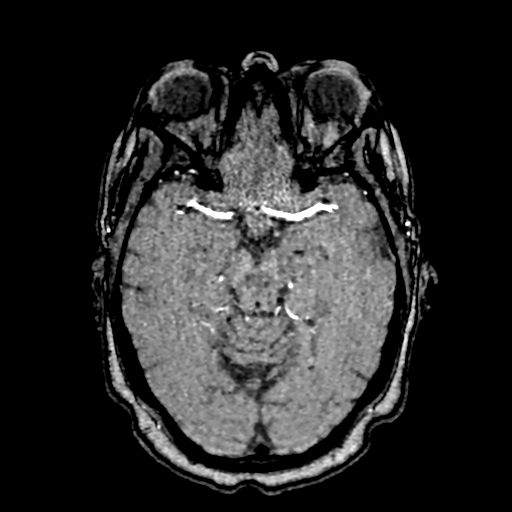
[im 118/205]
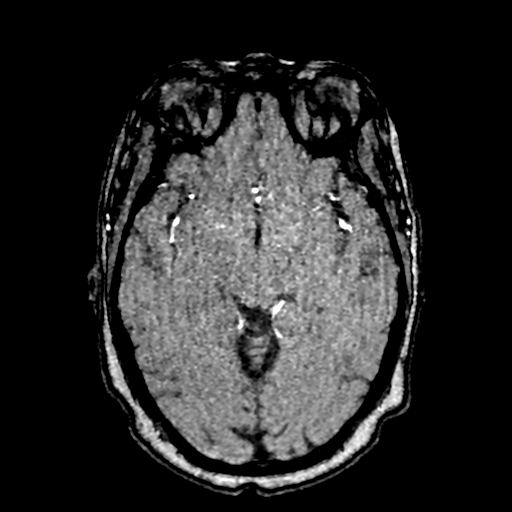
[im 144/205]
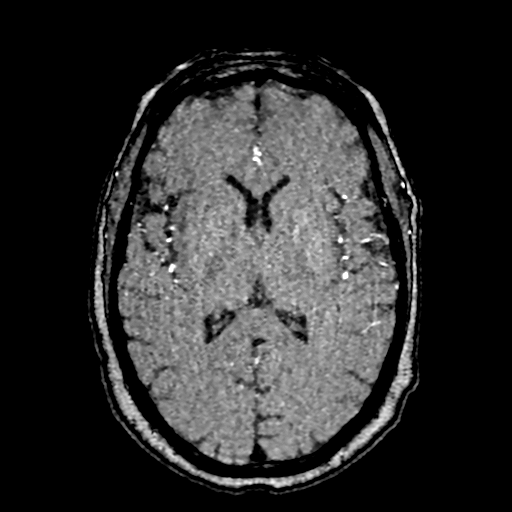
[im 170/205]
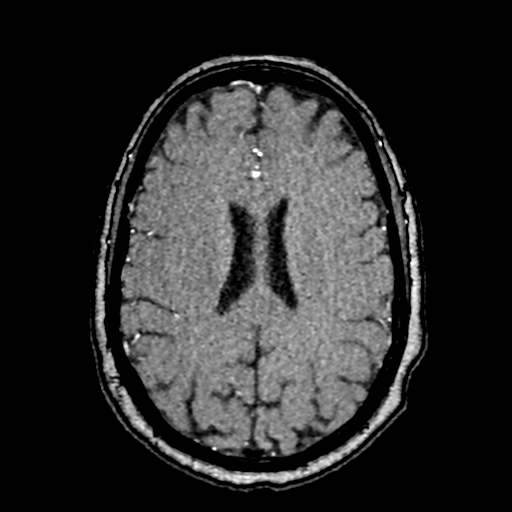
[im 174/205]
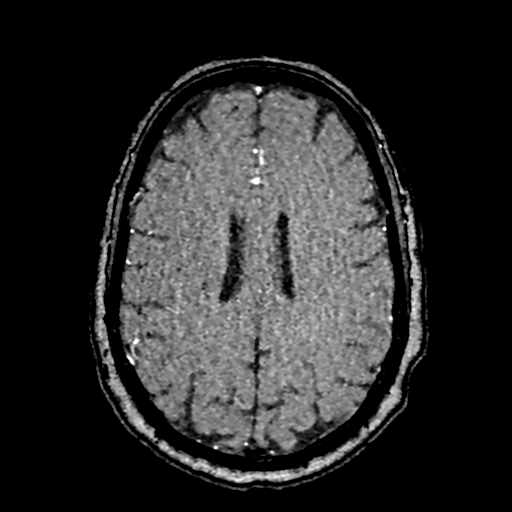
[im 196/205]
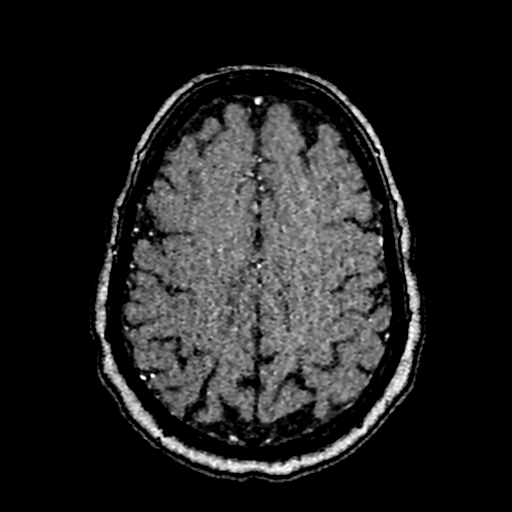

[17 of 48 positions shown; findings below may reference images not displayed]

FINDINGS: MRI HEAD FINDINGS

Brain:

Mild cerebral and cerebellar atrophy.

Subcentimeter acute cortical/subcortical infarcts within the
posterior right frontal lobe and right parietal lobe (involving the
pre and postcentral gyri). Additional punctate acute cortical
infarct more inferiorly and laterally within the right
parietooccipital lobes.

Mild multifocal T2/FLAIR hyperintensity within the cerebral white
matter is nonspecific, but compatible with chronic small vessel
ischemic disease.

No evidence of intracranial mass.

No chronic intracranial blood products.

No extra-axial fluid collection.

No midline shift.

Partially empty sella turcica.

Vascular: Reported below.

Skull and upper cervical spine: No focal marrow lesion.

Sinuses/Orbits: Visualized orbits show no acute finding. Prior lens
replacement on the left. No significant paranasal sinus disease.

MRA HEAD FINDINGS

The intracranial internal carotid arteries are patent. Apparent
severe stenoses within the proximal cavernous segments bilaterally
(series [CI], images 5 and 15). Stenoses of the cavernous ICAs more
distally (moderate right, mild left). The M1 middle cerebral
arteries are patent. No M2 proximal branch occlusion is identified.
Moderate stenosis within an inferior division proximal M2 right MCA
branch vessel (series [CI], image 13). The anterior cerebral
arteries are patent.

1-2 mm vascular protrusion arising from the pre-cavernous left ICA,
which may reflect atherosclerotic lobulation or a small aneurysm
(series 13, image 64) (series [CI], image 14).

Apparent moderate stenosis within the right vertebral artery at the
level of the skull base. The proximal V4 left vertebral artery is
poorly delineated, suggestive of high-grade stenosis or vessel
occlusion at this site. However, flow related signal is present
within the left PICA. The basilar artery is patent. The posterior
cerebral arteries are patent. Moderate/severe stenoses within the
proximal P2 posterior cerebral arteries bilaterally.
IMPRESSION: MRI brain:

1. Subcentimeter acute cortical/subcortical infarcts within the
posterior right frontal lobe and right parietal lobe (involving the
pre and postcentral gyri).
2. Additional punctate acute right parietooccipital cortical
infarct.
3. Mild cerebral atrophy and chronic small vessel ischemic disease.

MRA head:

1. Intracranial atherosclerotic disease with findings most notably
as follows.
2. The proximal V4 left vertebral artery is poorly delineated,
suggestive of high-grade stenosis or occlusion.
3. Moderate right vertebral artery stenosis at the level of the
skull base.
4. Moderate/severe stenoses within the proximal P2 posterior
cerebral arteries bilaterally.
5. Apparent severe stenoses within the proximal cavernous segments
of both intracranial internal carotid arteries. Moderate stenosis
within the cavernous right ICA more distally.
6. Moderate stenosis within an inferior division proximal M2 right
MCA vessel.
7. 1-2 mm vascular protrusion arising from the pre-avernous left ICA
which may reflect atherosclerotic lobulation or a small aneurysm.

## 2020-02-02 MED ORDER — CLOPIDOGREL BISULFATE 75 MG PO TABS: 75.0000 mg | ORAL_TABLET | Freq: Every day | ORAL | Status: DC

## 2020-02-02 MED ORDER — CLOPIDOGREL BISULFATE 75 MG PO TABS
75.0000 mg | ORAL_TABLET | Freq: Every day | ORAL | Status: DC
  Administered 2020-02-02 – 2020-02-06 (×5): 75 mg via ORAL
  Filled 2020-02-02 (×5): qty 1

## 2020-02-02 MED ORDER — LACTATED RINGERS IV BOLUS
1000.0000 mL | Freq: Once | INTRAVENOUS | Status: AC
  Administered 2020-02-02: 1000 mL via INTRAVENOUS

## 2020-02-02 MED ORDER — ASPIRIN 300 MG RE SUPP: 300.0000 mg | Freq: Every day | RECTAL | Status: DC

## 2020-02-02 MED ORDER — SODIUM CHLORIDE 0.9 % IV SOLN: INTRAVENOUS | Status: DC

## 2020-02-02 MED ORDER — ROSUVASTATIN CALCIUM 20 MG PO TABS
40.0000 mg | ORAL_TABLET | Freq: Every day | ORAL | Status: DC
  Administered 2020-02-02 – 2020-02-06 (×5): 40 mg via ORAL
  Filled 2020-02-02 (×6): qty 2

## 2020-02-02 MED ORDER — SODIUM CHLORIDE 0.9 % IV SOLN
1.0000 g | INTRAVENOUS | Status: DC
  Administered 2020-02-02 – 2020-02-05 (×4): 1 g via INTRAVENOUS
  Filled 2020-02-02 (×2): qty 10
  Filled 2020-02-02: qty 1
  Filled 2020-02-02: qty 10

## 2020-02-02 MED ORDER — FLUOXETINE HCL 10 MG PO CAPS
10.0000 mg | ORAL_CAPSULE | Freq: Every day | ORAL | Status: DC
  Administered 2020-02-02 – 2020-02-06 (×5): 10 mg via ORAL
  Filled 2020-02-02 (×6): qty 1

## 2020-02-02 MED ORDER — ACETAMINOPHEN 650 MG RE SUPP: 650.0000 mg | RECTAL | Status: DC | PRN

## 2020-02-02 MED ORDER — ACETAMINOPHEN 325 MG PO TABS: 650.0000 mg | ORAL_TABLET | ORAL | Status: DC | PRN

## 2020-02-02 MED ORDER — INSULIN ASPART 100 UNIT/ML ~~LOC~~ SOLN
0.0000 [IU] | Freq: Three times a day (TID) | SUBCUTANEOUS | Status: DC
  Administered 2020-02-02: 2 [IU] via SUBCUTANEOUS
  Administered 2020-02-03: 13:00:00 3 [IU] via SUBCUTANEOUS
  Administered 2020-02-03 – 2020-02-04 (×2): 2 [IU] via SUBCUTANEOUS
  Administered 2020-02-05: 12:00:00 3 [IU] via SUBCUTANEOUS
  Administered 2020-02-05 – 2020-02-06 (×2): 2 [IU] via SUBCUTANEOUS
  Filled 2020-02-02 (×8): qty 1

## 2020-02-02 MED ORDER — STROKE: EARLY STAGES OF RECOVERY BOOK: Freq: Once | Status: AC

## 2020-02-02 MED ORDER — INSULIN ASPART 100 UNIT/ML ~~LOC~~ SOLN: 0.0000 [IU] | SUBCUTANEOUS | Status: DC

## 2020-02-02 MED ORDER — ASPIRIN EC 325 MG PO TBEC
325.0000 mg | DELAYED_RELEASE_TABLET | Freq: Every day | ORAL | Status: DC
  Administered 2020-02-02 – 2020-02-06 (×5): 325 mg via ORAL
  Filled 2020-02-02 (×5): qty 1

## 2020-02-02 MED ORDER — ACETAMINOPHEN 160 MG/5ML PO SOLN
650.0000 mg | ORAL | Status: DC | PRN
  Filled 2020-02-02: qty 20.3

## 2020-02-02 NOTE — ED Notes (Addendum)
Pt ambulatory to restroom with steady gait, UA obtained

## 2020-02-02 NOTE — Evaluation (Signed)
Occupational Therapy Evaluation Patient Details Name: Danielle Paul MRN: [id number] DOB: [birth year] Today's Date: 02/02/2020    History of Present Illness 64 y.o. female with medical history significant for coronary artery disease, hypertension, aortic stenosis status post TAVR, history of sarcoid and diabetes mellitus who presents to the ER for evaluation of left arm weakness and numbness that started around noon the day prior to her admission.  She also had associated tingling in her left arm.  She states that she initially was unable to lift her left arm but that has improved and at the time of this history and physical she is able to move that left arm but continues to complain of tingling.  She denies any left lower extremity weakness or tingling, she denies having any falls, no loss of consciousness, no blurry vision, no headache or difficulty swallowing. Imaging revealed "subcentimeter acute cortical/ subcortical infarcts within the posterior right frontal lobe and right parietal lobe (involving the pre and postcentral gyri), punctate acute right parietooccipital cortical infarct, and mild cerebral atrophy and chronic small vessel ischemic disease."   Clinical Impression   Pt seen for OT evaluation this date. Prior to hospital admission, pt was independent in all aspects of ADL/IADL and mobility. Pt lives with her spouse in a 2 story home with 1 step to enter and 12 steps to 2nd floor. Currently pt reporting symptoms have resolved. Pt demonstrates baseline independence to perform ADL and mobility tasks and no strength, sensory, coordination, cognitive, or visual deficits appreciated with assessment. Pt educated in signs and symptoms of neurological events as well stress mgt strategies given pt endorsed having increased stress since husband had a CVA and she has been helping care for him. No skilled OT needs identified at this time. Will sign off. Please re-consult if additional OT needs arise.     Follow Up Recommendations  No OT follow up    Equipment Recommendations  None recommended by OT    Recommendations for Other Services       Precautions / Restrictions Precautions Precautions: None Restrictions Weight Bearing Restrictions: No      Mobility Bed Mobility Overal bed mobility: Independent                  Transfers Overall transfer level: Independent                    Balance Overall balance assessment: No apparent balance deficits (not formally assessed)                                         ADL either performed or assessed with clinical judgement   ADL Overall ADL's : Independent;At baseline                                             Vision Baseline Vision/History: No visual deficits Patient Visual Report: No change from baseline       Perception     Praxis      Pertinent Vitals/Pain Pain Assessment:  ("crick" in my neck from being in the chair all night)     Hand Dominance Right   Extremity/Trunk Assessment Upper Extremity Assessment Upper Extremity Assessment: Overall WFL for tasks assessed (no sensory, coordination, or strength deficits appreciated)   Lower Extremity  Assessment Lower Extremity Assessment: Overall WFL for tasks assessed (no sensory, coordination, or strength deficits appreciated)   Cervical / Trunk Assessment Cervical / Trunk Assessment: Normal   Communication Communication Communication: No difficulties (wears dentures and does not have them here, but no difficulty with communication')   Cognition Arousal/Alertness: Awake/alert Behavior During Therapy: WFL for tasks assessed/performed Overall Cognitive Status: Within Functional Limits for tasks assessed                                     General Comments       Exercises Other Exercises Other Exercises: Pt educated in s/s of stroke, stress mgt   Shoulder Instructions      Home Living  Family/patient expects to be discharged to:: Private residence Living Arrangements: Spouse/significant other Available Help at Discharge: Family;Available 24 hours/day Type of Home: House Home Access: Stairs to enter [employer] of Steps: 1   Home Layout: Two level Alternate Level Stairs-Number of Steps: 12   Bathroom Shower/Tub: Tub only;Walk-in shower   Bathroom Toilet: Handicapped height (BSC  ove rtoilet)     Home Equipment: Grab bars - tub/shower;Shower seat          Prior Functioning/Environment Level of Independence: Independent        Comments: Indep with ADL, mobility, 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, no falls        OT Problem List: Impaired sensation      OT Treatment/Interventions:      OT Goals(Current goals can be found in the care plan section) Acute Rehab OT Goals Patient Stated Goal: go home OT Goal Formulation: All assessment and education complete, DC therapy  OT Frequency:     Barriers to D/C:            Co-evaluation              AM-PAC OT "6 Clicks" Daily Activity     Outcome Measure Help from another person eating meals?: None Help from another person taking care of personal grooming?: None Help from another person toileting, which includes using toliet, bedpan, or urinal?: None Help from another person bathing (including washing, rinsing, drying)?: None Help from another person to put on and taking off regular upper body clothing?: None Help from another person to put on and taking off regular lower body clothing?: None 6 Click Score: 24   End of Session    Activity Tolerance: Patient tolerated treatment well Patient left: in bed;with call bell/phone within reach;with bed alarm set  OT Visit Diagnosis: Other abnormalities of gait and mobility (R26.89)                Time: 6244-6950 OT Time Calculation (min): 18 min Charges:  OT General Charges $OT Visit: 1 Visit OT Evaluation $OT Eval Low Complexity: 1 Low OT  Treatments $Self Care/Home Management : 8-22 mins  Richrd Prime, MPH, MS, OTR/L ascom [phone] 02/02/20, 4:17 PM

## 2020-02-02 NOTE — ED Notes (Signed)
Pt moved to recliner chair for comfort and given warm blankets

## 2020-02-02 NOTE — ED Notes (Signed)
Spoke to Dr. Lenard Lance regarding pt, MD agrees with standing orders that were enacted and no additional orders placed.

## 2020-02-02 NOTE — Progress Notes (Signed)
Pt declines transport to ultrasound at this time. Request rescheduling for after meal completion.

## 2020-02-02 NOTE — ED Notes (Signed)
Pt at MRI

## 2020-02-02 NOTE — ED Notes (Signed)
Pt taken to BR at request via w/c; pt able to stand to commode without assist; pt updated on wait time and vs retaken

## 2020-02-02 NOTE — Progress Notes (Signed)
SLP Cancellation Note  Patient Details Name: Kiyani Jernigan MRN: [id number] DOB: 23-Sep-1955   Cancelled treatment:       Reason Eval/Treat Not Completed: SLP screened, no needs identified, will sign off (chart reviewed; consulted NSG and met w/ pt in hall). Pt seen w/ NSG during [facility] swallow screen. Pt denied any difficulty swallowing and passed the [place] screen. She is currently on a regular diet. Encouraged her to take her time when wallowing pills w/ water and use applesauce if needed for ease of swallowing. Pt conversed at conversational level w/out deficits noted; pt and NSG denied any speech-language deficits. Pt lives at home w/ Husband and is eager to get home.  No further skilled ST services indicated as pt appears at her baseline. Pt agreed. NSG to reconsult if any change in status while admitted.    Orinda Kenner, MS, CCC-SLP Speech Language Pathologist [facility]hab [facility] [phone] [facility] 02/02/2020, 12:10 PM

## 2020-02-02 NOTE — ED Notes (Signed)
Pt states that yesterday afternoon after she saw her PCP, around 11 or 12, she noticed that she was having weakness in her left arm. Pt reports that she was not able to lift her left arm at all yesterday, pt is currently able to lift arm but does have weaker grip on the left side. Pt reports that she is still having tingling in her fingers on the left arm. Pt denies numbness/tingling in her left leg. Pt does not have any facial droop and speech is clear at this time.

## 2020-02-02 NOTE — ED Provider Notes (Signed)
[facility] Emergency Department Provider Note  ____________________________________________   Event Date/Time   First MD Initiated Contact with Patient 02/02/20 [phone]     (approximate)  I have reviewed the triage vital signs and the nursing notes.   HISTORY  Chief Complaint Weakness   HPI Danielle Paul is a 64 y.o. female with past medical history of CAD, HTN, HDL, DM and aortic stenosis status post TAVR, and sarcoid who presents for assessment of some left arm weakness and numbness that she states she thinks began between noon and 1 PM yesterday.  Patient states she also had some tingling in her left arm.  She states the weakness improved although she is not sure exactly when she regained full strength in her arm.  She states she still has some tingling in her left arm.  She thinks her strength came back while she was in the waiting room sometime in the last 1 to 3 hours.  She denies being on any blood thinners or any history of stroke.  She denies any other weakness or sensory changes in her other extremities, vision changes, headache, vertigo or any other acute sick symptoms.  She denies any chest pain, cough, shortness of breath, vomiting, diarrhea, dysuria, rash or recent injuries or falls.  Denies EtOH use or drug use.         Past Medical History:  Diagnosis Date  . Diabetes mellitus without complication (HCC)   . Hypercholesteremia   . Hypertension     Patient Active Problem List   Diagnosis Date Noted  . TIA (transient ischemic attack) 02/02/2020  . AKI (acute kidney injury) (HCC) 02/02/2020  . Acute lower UTI 02/02/2020  . Diabetes mellitus without complication (HCC)   . Hypertension     Past Surgical History:  Procedure Laterality Date  . AORTIC VALVE REPLACEMENT (AVR)/CORONARY ARTERY BYPASS GRAFTING (CABG)  2009    Prior to Admission medications   Medication Sig Start Date End Date Taking? Authorizing Provider  amLODipine (NORVASC) 10  MG tablet Take 10 mg by mouth daily.   Yes [provider]  aspirin 81 MG EC tablet Take 81 mg by mouth daily.   Yes [provider]  carvedilol (COREG) 25 MG tablet Take 25 mg by mouth 2 (two) times daily.   Yes [provider]  FLUoxetine (PROZAC) 10 MG tablet Take 10 mg by mouth daily. 12/25/19  Yes [provider]  hydrochlorothiazide (HYDRODIURIL) 25 MG tablet Take 12.5 mg by mouth daily.   Yes [provider]  insulin lispro (HUMALOG) 100 UNIT/ML injection Inject 3-9 Units into the skin 3 (three) times daily before meals. (based upon blood glucose reading)   Yes [provider]  LANTUS SOLOSTAR 100 UNIT/ML Solostar Pen Inject 30 Units into the skin at bedtime.   Yes [provider]  rosuvastatin (CRESTOR) 40 MG tablet Take 40 mg by mouth daily. 12/29/19  Yes [provider]  spironolactone (ALDACTONE) 25 MG tablet Take 25 mg by mouth daily.   Yes [provider]    Allergies Atorvastatin, Isosorbide nitrate, Pravastatin, and Simvastatin  Family History  Problem Relation Age of Onset  . Other Mother   . Hypertension Mother   . Hypertension Father     Social History Social History   Tobacco Use  . Smoking status: Never Smoker  . Smokeless tobacco: Never Used  Substance Use Topics  . Alcohol use: Never  . Drug use: Never    Review of Systems  Review of Systems  Constitutional: Negative for chills and fever.  HENT: Negative for sore throat.   Eyes: Negative for pain.  Respiratory: Negative for cough and stridor.   Cardiovascular: Negative for chest pain.  Gastrointestinal: Negative for vomiting.  Genitourinary: Negative for dysuria.  Musculoskeletal: Negative for myalgias.  Skin: Negative for rash.  Neurological: Positive for sensory change ( tingling in L arm) and focal weakness ( L arm ). Negative for seizures, loss of consciousness and headaches.  Psychiatric/Behavioral: Negative for  suicidal ideas.  All other systems reviewed and are negative.     ____________________________________________   PHYSICAL EXAM:  VITAL SIGNS: ED Triage Vitals  Enc Vitals Group     BP 02/01/20 2331 134/62     Pulse Rate 02/01/20 2331 72     Resp 02/01/20 2331 18     Temp 02/01/20 2331 98.7 F (37.1 C)     Temp src --      SpO2 02/01/20 2312 98 %     Weight 02/01/20 2332 165 lb (74.8 kg)     Height 02/01/20 2332  (1.549 m)     Head Circumference --      Peak Flow --      Pain Score 02/01/20 2331 0     Pain Loc --      Pain Edu? --      Excl. in GC? --    Vitals:   02/02/20 1350 02/02/20 1503  BP: (!) 146/63 (!) 153/61  Pulse: 67 65  Resp: 18 16  Temp:  98.3 F (36.8 C)  SpO2: 100% 100%   Physical Exam Vitals and nursing note reviewed.  Constitutional:      General: She is not in acute distress.    Appearance: She is well-developed and well-nourished.  HENT:     Head: Normocephalic and atraumatic.     Right Ear: External ear normal.     Left Ear: External ear normal.     Nose: Nose normal.     Mouth/Throat:     Mouth: Mucous membranes are dry.  Eyes:     Conjunctiva/sclera: Conjunctivae normal.  Cardiovascular:     Rate and Rhythm: Normal rate and regular rhythm.     Heart sounds: No murmur heard.   Pulmonary:     Effort: Pulmonary effort is normal. No respiratory distress.     Breath sounds: Normal breath sounds.  Abdominal:     Palpations: Abdomen is soft.     Tenderness: There is no abdominal tenderness.  Musculoskeletal:        General: No edema.     Cervical back: Neck supple.  Skin:    General: Skin is warm and dry.     Capillary Refill: Capillary refill takes 2 to 3 seconds.  Neurological:     Mental Status: She is alert and oriented to person, place, and time.  Psychiatric:        Mood and Affect: Mood and affect and mood normal.     Sensation is intact to light touch in the distribution of the radial ulnar median nerves in the  bilateral upper extremities. Cranial nerves II to XII grossly intact.  Subtle left upper extremity pronator drift.  No finger dysmetria.  Patient has full strength in right upper extremity about lower extremities but is a little bit weak on left arm extension of the elbow.  2+ bilateral radial pulses.  ____________________________________________   LABS (all labs ordered are listed, but only abnormal results are  displayed)  Labs Reviewed  CBC - Abnormal; Notable for the following components:      Result Value   MCV 79.5 (*)    All other components within normal limits  COMPREHENSIVE METABOLIC PANEL - Abnormal; Notable for the following components:   Glucose, Bld 155 (*)    BUN 39 (*)    Creatinine, Ser 2.42 (*)    GFR, Estimated 22 (*)    All other components within normal limits  URINALYSIS, COMPLETE (UACMP) WITH MICROSCOPIC - Abnormal; Notable for the following components:   Color, Urine YELLOW (*)    APPearance HAZY (*)    Glucose, UA 50 (*)    Hgb urine dipstick SMALL (*)    Nitrite POSITIVE (*)    Leukocytes,Ua LARGE (*)    Bacteria, UA FEW (*)    All other components within normal limits  CBG MONITORING, ED - Abnormal; Notable for the following components:   Glucose-Capillary 122 (*)    All other components within normal limits  RESP PANEL BY RT-PCR (FLU A&B, COVID) ARPGX2  PROTIME-INR  APTT  DIFFERENTIAL  HEMOGLOBIN A1C  HIV ANTIBODY (ROUTINE TESTING W REFLEX)  TROPONIN I (HIGH SENSITIVITY)   ____________________________________________  EKG  Sinus rhythm with a ventricular of 72, nonspecific T wave changes in the inferior lateral leads.  Normal axis and unremarkable intervals. ____________________________________________  RADIOLOGY  ED MD interpretation: Chest x-ray unremarkable for evidence of focal consolidation, overt edema, large effusion, pneumothorax or other acute intrathoracic process.  CT head shows no evidence of intracranial hemorrhage or other clear  acute intracranial process.  Official radiology report(s): DG Chest 1 View  Result Date: 02/02/2020 CLINICAL DATA:  Weakness EXAM: CHEST  1 VIEW COMPARISON:  None. FINDINGS: The heart size and mediastinal contours are within normal limits. Aortic knob calcifications are seen. Overlying median sternotomy wires are present. Both lungs are clear. The visualized skeletal structures are unremarkable. IMPRESSION: No active disease. Electronically Signed   By: Jonna Clark M.D.   On: 02/02/2020 00:19   CT HEAD WO CONTRAST  Result Date: 02/02/2020 CLINICAL DATA:  Weakness and left arm tingling. EXAM: CT HEAD WITHOUT CONTRAST TECHNIQUE: Contiguous axial images were obtained from the base of the skull through the vertex without intravenous contrast. COMPARISON:  None. FINDINGS: Brain: No evidence of acute infarction, hemorrhage, hydrocephalus, extra-axial collection or mass lesion/mass effect. Vascular: No hyperdense vessel or unexpected calcification. Skull: Normal. Negative for fracture or focal lesion. Sinuses/Orbits: No acute finding. Other: None. IMPRESSION: No acute intracranial pathology. Electronically Signed   By: Aram Candela M.D.   On: 02/02/2020 00:30   MR ANGIO HEAD WO CONTRAST  Result Date: 02/02/2020 CLINICAL DATA:  Neuro deficit, acute, stroke suspected. Additional history provided: Left arm tingling with weakness since yesterday morning. EXAM: MRI HEAD WITHOUT CONTRAST MRA HEAD WITHOUT CONTRAST TECHNIQUE: Multiplanar, multiecho pulse sequences of the brain and surrounding structures were obtained without intravenous contrast. Angiographic images of the head were obtained using MRA technique without contrast. COMPARISON:  Head CT 02/02/2020. FINDINGS: MRI HEAD FINDINGS Brain: Mild cerebral and cerebellar atrophy. Subcentimeter acute cortical/subcortical infarcts within the posterior right frontal lobe and right parietal lobe (involving the pre and postcentral gyri). Additional punctate  acute cortical infarct more inferiorly and laterally within the right parietooccipital lobes. Mild multifocal T2/FLAIR hyperintensity within the cerebral white matter is nonspecific, but compatible with chronic small vessel ischemic disease. No evidence of intracranial mass. No chronic intracranial blood products. No extra-axial fluid collection. No midline shift. Partially empty  sella turcica. Vascular: Reported below. Skull and upper cervical spine: No focal marrow lesion. Sinuses/Orbits: Visualized orbits show no acute finding. Prior lens replacement on the left. No significant paranasal sinus disease. MRA HEAD FINDINGS The intracranial internal carotid arteries are patent. Apparent severe stenoses within the proximal cavernous segments bilaterally (series 1067, images 5 and 15). Stenoses of the cavernous ICAs more distally (moderate right, mild left). The M1 middle cerebral arteries are patent. No M2 proximal branch occlusion is identified. Moderate stenosis within an inferior division proximal M2 right MCA branch vessel (series 1067, image 13). The anterior cerebral arteries are patent. 1-2 mm vascular protrusion arising from the pre-cavernous left ICA, which may reflect atherosclerotic lobulation or a small aneurysm (series 13, image 64) (series 1061, image 14). Apparent moderate stenosis within the right vertebral artery at the level of the skull base. The proximal V4 left vertebral artery is poorly delineated, suggestive of high-grade stenosis or vessel occlusion at this site. However, flow related signal is present within the left PICA. The basilar artery is patent. The posterior cerebral arteries are patent. Moderate/severe stenoses within the proximal P2 posterior cerebral arteries bilaterally. IMPRESSION: MRI brain: 1. Subcentimeter acute cortical/subcortical infarcts within the posterior right frontal lobe and right parietal lobe (involving the pre and postcentral gyri). 2. Additional punctate acute  right parietooccipital cortical infarct. 3. Mild cerebral atrophy and chronic small vessel ischemic disease. MRA head: 1. Intracranial atherosclerotic disease with findings most notably as follows. 2. The proximal V4 left vertebral artery is poorly delineated, suggestive of high-grade stenosis or occlusion. 3. Moderate right vertebral artery stenosis at the level of the skull base. 4. Moderate/severe stenoses within the proximal P2 posterior cerebral arteries bilaterally. 5. Apparent severe stenoses within the proximal cavernous segments of both intracranial internal carotid arteries. Moderate stenosis within the cavernous right ICA more distally. 6. Moderate stenosis within an inferior division proximal M2 right MCA vessel. 7. 1-2 mm vascular protrusion arising from the pre-avernous left ICA which may reflect atherosclerotic lobulation or a small aneurysm. Electronically Signed   By: Jackey Loge DO   On: 02/02/2020 12:14   MR Brain Wo Contrast (neuro protocol)  Result Date: 02/02/2020 CLINICAL DATA:  Neuro deficit, acute, stroke suspected. Additional history provided: Left arm tingling with weakness since yesterday morning. EXAM: MRI HEAD WITHOUT CONTRAST MRA HEAD WITHOUT CONTRAST TECHNIQUE: Multiplanar, multiecho pulse sequences of the brain and surrounding structures were obtained without intravenous contrast. Angiographic images of the head were obtained using MRA technique without contrast. COMPARISON:  Head CT 02/02/2020. FINDINGS: MRI HEAD FINDINGS Brain: Mild cerebral and cerebellar atrophy. Subcentimeter acute cortical/subcortical infarcts within the posterior right frontal lobe and right parietal lobe (involving the pre and postcentral gyri). Additional punctate acute cortical infarct more inferiorly and laterally within the right parietooccipital lobes. Mild multifocal T2/FLAIR hyperintensity within the cerebral white matter is nonspecific, but compatible with chronic small vessel ischemic disease.  No evidence of intracranial mass. No chronic intracranial blood products. No extra-axial fluid collection. No midline shift. Partially empty sella turcica. Vascular: Reported below. Skull and upper cervical spine: No focal marrow lesion. Sinuses/Orbits: Visualized orbits show no acute finding. Prior lens replacement on the left. No significant paranasal sinus disease. MRA HEAD FINDINGS The intracranial internal carotid arteries are patent. Apparent severe stenoses within the proximal cavernous segments bilaterally (series 1067, images 5 and 15). Stenoses of the cavernous ICAs more distally (moderate right, mild left). The M1 middle cerebral arteries are patent. No M2 proximal branch occlusion is identified. Moderate  stenosis within an inferior division proximal M2 right MCA branch vessel (series 1067, image 13). The anterior cerebral arteries are patent. 1-2 mm vascular protrusion arising from the pre-cavernous left ICA, which may reflect atherosclerotic lobulation or a small aneurysm (series 13, image 64) (series 1061, image 14). Apparent moderate stenosis within the right vertebral artery at the level of the skull base. The proximal V4 left vertebral artery is poorly delineated, suggestive of high-grade stenosis or vessel occlusion at this site. However, flow related signal is present within the left PICA. The basilar artery is patent. The posterior cerebral arteries are patent. Moderate/severe stenoses within the proximal P2 posterior cerebral arteries bilaterally. IMPRESSION: MRI brain: 1. Subcentimeter acute cortical/subcortical infarcts within the posterior right frontal lobe and right parietal lobe (involving the pre and postcentral gyri). 2. Additional punctate acute right parietooccipital cortical infarct. 3. Mild cerebral atrophy and chronic small vessel ischemic disease. MRA head: 1. Intracranial atherosclerotic disease with findings most notably as follows. 2. The proximal V4 left vertebral artery is  poorly delineated, suggestive of high-grade stenosis or occlusion. 3. Moderate right vertebral artery stenosis at the level of the skull base. 4. Moderate/severe stenoses within the proximal P2 posterior cerebral arteries bilaterally. 5. Apparent severe stenoses within the proximal cavernous segments of both intracranial internal carotid arteries. Moderate stenosis within the cavernous right ICA more distally. 6. Moderate stenosis within an inferior division proximal M2 right MCA vessel. 7. 1-2 mm vascular protrusion arising from the pre-avernous left ICA which may reflect atherosclerotic lobulation or a small aneurysm. Electronically Signed   By: Jackey Loge DO   On: 02/02/2020 12:14    ____________________________________________   PROCEDURES  Procedure(s) performed (including Critical Care):  Procedures   ____________________________________________   INITIAL IMPRESSION / ASSESSMENT AND PLAN / ED COURSE        Patient presents with [place] to history exam for assessment of some left arm weakness and paresthesias.  These began sometime between 12 PM and 1 PM yesterday and the strength seems to have returned while she was in the waiting room although she is not exactly sure when.  She does endorse some continued paresthesias but has symmetric sensation to light touch throughout her bilateral extremities.  She does have subtle left-sided pronator drift and a little bit of weakness in left upper extremity but no other clear focal neurological deficits.  She is afebrile hemodynamically stable arrival.  Overall no history or exam findings suggest an acute traumatic injury or acute pectus process.  CMP remarkable for glucose of 155 with a creatinine of 2.42 and a BUN of 39 with no other significant electrolyte or metabolic derangements.  On review of prior records patient was noted to have a creatinine of 1 on 11/2017 and today's value represents an acute kidney injury.  Suspect this is prerenal given  she has dry mucous membranes and elevated BUN as well.  CBC is unremarkable.  Given concern for possible CVA with negative CT MR studies ordered.   Patient will be admitted to medicine service for further evaluation management of concern for possible stroke.     ____________________________________________   FINAL CLINICAL IMPRESSION(S) / ED DIAGNOSES  Final diagnoses:  Weakness  Dehydration  AKI (acute kidney injury) (HCC)    Medications  sodium chloride flush (NS) 0.9 % injection 3 mL (3 mLs Intravenous Not Given 02/02/20 0943)   stroke: mapping our early stages of recovery book (0 each Does not apply Hold 02/02/20 1158)  acetaminophen (TYLENOL) tablet 650 mg (  has no administration in time range)    Or  acetaminophen (TYLENOL) 160 MG/5ML solution 650 mg (has no administration in time range)    Or  acetaminophen (TYLENOL) suppository 650 mg (has no administration in time range)  0.9 %  sodium chloride infusion ( Intravenous Not Given 02/02/20 1209)  aspirin suppository 300 mg ( Rectal See Alternative 02/02/20 1208)    Or  aspirin EC tablet 325 mg (325 mg Oral Given 02/02/20 1208)  cefTRIAXone (ROCEPHIN) 1 g in sodium chloride 0.9 % 100 mL IVPB (0 g Intravenous Stopped 02/02/20 1502)  0.9 %  sodium chloride infusion ( Intravenous New Bag/Given 02/02/20 1209)  rosuvastatin (CRESTOR) tablet 40 mg (has no administration in time range)  FLUoxetine (PROZAC) capsule 10 mg (has no administration in time range)  insulin aspart (novoLOG) injection 0-15 Units (has no administration in time range)  clopidogrel (PLAVIX) tablet 75 mg (has no administration in time range)  lactated ringers bolus 1,000 mL (0 mLs Intravenous Stopped 02/02/20 0949)     ED Discharge Orders    None       Note:  This document was prepared using Dragon voice recognition software and may include unintentional dictation errors.   Gilles ChiquitoSmith, Mashawn Brazil P, MD 02/02/20 [phone]

## 2020-02-02 NOTE — Evaluation (Signed)
Physical Therapy Evaluation Patient Details Name: Danielle Paul MRN: [id number] DOB: 01-Jul-1955 Today's Date: 02/02/2020   History of Present Illness  64 y.o. female with medical history significant for coronary artery disease, hypertension, aortic stenosis status post TAVR, history of sarcoid and diabetes mellitus who presents to the ER for evaluation of left arm weakness and numbness that started around noon the day prior to her admission.  She also had associated tingling in her left arm.  She states that she initially was unable to lift her left arm but that has improved and at the time of this history and physical she is able to move that left arm but continues to complain of tingling.  She denies any left lower extremity weakness or tingling, she denies having any falls, no loss of consciousness, no blurry vision, no headache or difficulty swallowing. Imaging revealed subcentimeter acute cortical/ subcortical infarcts within the posterior right frontal lobe and right parietal lobe (involving the pre and postcentral gyri), punctate acute right parietooccipital cortical infarct, and mild cerebral atrophy and chronic small vessel ischemic disease.  Clinical Impression  Pt admitted with above diagnosis. Pt currently with functional limitations due to the deficits listed below (see "PT Problem List"). Upon entry, pt in bed, awake and agreeable to participate. Pt performs all mobility as baseline level performance, fully independent, no indication of falls risk. No indicated need to PT services either acute or post DC. Patient is at baseline, all education completed, and time is given to address all questions/concerns. No additional skilled PT services needed at this time, PT signing off. PT recommends daily ambulation ad lib or with nursing staff as needed to prevent deconditioning.     Follow Up Recommendations No PT follow up    Equipment Recommendations  None recommended by PT    Recommendations  for Other Services       Precautions / Restrictions Precautions Precautions: Fall Restrictions Weight Bearing Restrictions: No      Mobility  Bed Mobility Overal bed mobility: Independent                  Transfers Overall transfer level: Independent                  Ambulation/Gait Ambulation/Gait assistance: Independent Gait Distance (Feet): 725 Feet Assistive device: None Gait Pattern/deviations: WFL(Within Functional Limits) Gait velocity: 1.52m/s   General Gait Details: baseline mobility  Stairs Stairs: Yes Stairs assistance: Modified independent (Device/Increase time) Stair Management: Two rails;Alternating pattern Number of Stairs: 12 General stair comments: baseline, no frank weakness or asymmetry  Wheelchair Mobility    Modified Rankin (Stroke Patients Only)       Balance Overall balance assessment: Independent;No apparent balance deficits (not formally assessed)                                           Pertinent Vitals/Pain Pain Assessment: No/denies pain    Home Living Family/patient expects to be discharged to:: Private residence Living Arrangements: Spouse/significant other Available Help at Discharge: Family;Available 24 hours/day Type of Home: House Home Access: Stairs to enter   [employer] of Steps: 1 Home Layout: Two level Home Equipment: Grab bars - tub/shower;Shower seat      Prior Function Level of Independence: Independent         Comments: Indep with ADL, mobility, 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, no falls     Hand Dominance  Dominant Hand: Right    Extremity/Trunk Assessment   Upper Extremity Assessment Upper Extremity Assessment: Overall WFL for tasks assessed    Lower Extremity Assessment Lower Extremity Assessment: Overall WFL for tasks assessed    Cervical / Trunk Assessment Cervical / Trunk Assessment: Normal  Communication   Communication: No difficulties (wears  dentures and does not have them here, but no difficulty with communication')  Cognition Arousal/Alertness: Awake/alert Behavior During Therapy: WFL for tasks assessed/performed Overall Cognitive Status: Within Functional Limits for tasks assessed                                        General Comments      Exercises Other Exercises Other Exercises: Pt educated in s/s of stroke, stress mgt   Assessment/Plan    PT Assessment Patent does not need any further PT services  PT Problem List         PT Treatment Interventions      PT Goals (Current goals can be found in the Care Plan section)  Acute Rehab PT Goals Patient Stated Goal: go home PT Goal Formulation: All assessment and education complete, DC therapy    Frequency     Barriers to discharge        Co-evaluation               AM-PAC PT "6 Clicks" Mobility  Outcome Measure Help needed turning from your back to your side while in a flat bed without using bedrails?: None Help needed moving from lying on your back to sitting on the side of a flat bed without using bedrails?: None Help needed moving to and from a bed to a chair (including a wheelchair)?: None Help needed standing up from a chair using your arms (e.g., wheelchair or bedside chair)?: None Help needed to walk in hospital room?: None Help needed climbing 3-5 steps with a railing? : None 6 Click Score: 24    End of Session Equipment Utilized During Treatment: Gait belt Activity Tolerance: Patient tolerated treatment well Patient left: in bed        Time: 1157-2620 PT Time Calculation (min) (ACUTE ONLY): 14 min   Charges:   PT Evaluation $PT Eval Low Complexity: 1 Low          .4:38 PM, 02/02/20 Rosamaria Lints, PT, DPT Physical Therapist - [facility]  [phone] (ASCOM)   Catilyn Boggus C 02/02/2020, 4:36 PM

## 2020-02-02 NOTE — ED Notes (Signed)
Pt presents to ED with c/o of feeling weak since yesterday. Pt denies any other complaints at this time. Pt moved to 11H from sub waiting with IVF infusing. Pt did well from wheelchair to bed. Pt denies N/V/D. Pt is A&Ox4.

## 2020-02-02 NOTE — H&P (Signed)
History and Physical    Danielle Paul DXA:[id number] DOB: 17-Oct-1955 DOA: 02/02/2020  [facility]P: [facility]   Patient coming from: Home  I have personally briefly reviewed patient's old medical records in [facility]  Chief Complaint: Left arm weakness  HPI: Danielle Paul is a 64 y.o. female with medical history significant for coronary artery disease, hypertension, aortic stenosis status post TAVR, history of sarcoid and diabetes mellitus who presents to the ER for evaluation of left arm weakness and numbness that started around noon the day prior to her admission.  She also had associated tingling in her left arm.  She states that she initially was unable to lift her left arm but that has improved and at the time of this history and physical she is able to move that left arm but continues to complain of tingling.  She denies any left lower extremity weakness or tingling, she denies having any falls, no loss of consciousness, no blurry vision, no headache or difficulty swallowing. She denies having any chest pain, no shortness of breath, no fever, no chills, no nausea, no vomiting, no changes in her bowel habits or any urinary symptoms. Labs show sodium 135, potassium 4.5, chloride 99, bicarb 25, glucose 155, BUN 39, creatinine 2.42, calcium 9.4, alkaline phosphatase 76, albumin 4.1, AST 23, ALT 21, calcium 8.0, white count 6.3, hemoglobin 12.6, hematocrit 38.1, MCV 79.5, RDW 12.6, platelet count 331 Respiratory viral panel is negative Urine analysis shows pyuria with positive nitrites and leukocyte esterase CT scan of the head without contrast shows no acute intracranial pathology. Chest x-ray reviewed by me shows no active disease. Twelve-lead EKG reviewed by me shows normal sinus rhythm with diffuse T wave inversions   ED Course: Patient is a 64 year old female with a history of diabetes mellitus and hypertension who presents to the emergency room for evaluation of left arm  weakness and tingling which started around noon on the day prior to her admission.  Left arm weakness has improved but not resolved completely and tingling persists.  She will be referred to observation status for further evaluation.  Review of Systems: As per HPI otherwise 10 point review of systems negative.    Past Medical History:  Diagnosis Date  . Diabetes mellitus without complication (HCC)   . Hypercholesteremia   . Hypertension     Past Surgical History:  Procedure Laterality Date  . AORTIC VALVE REPLACEMENT (AVR)/CORONARY ARTERY BYPASS GRAFTING (CABG)  2009     reports that she has never smoked. She has never used smokeless tobacco. She reports that she does not drink alcohol and does not use drugs.  No Known Allergies  Family History  Problem Relation Age of Onset  . Other Mother   . Hypertension Mother   . Hypertension Father      Prior to Admission medications   Not on File    Physical Exam: Vitals:   02/01/20 2332 02/02/20 0257 02/02/20 0611 02/02/20 0817  BP:  (!) 132/46 (!) 158/46 (!) 148/49  Pulse:  69 73 65  Resp:  15 18 16   Temp:      SpO2:  98% 97% 100%  Weight: 74.8 kg     Height: 5\' 1"  (1.549 m)        Vitals:   02/01/20 2332 02/02/20 0257 02/02/20 0611 02/02/20 0817  BP:  (!) 132/46 (!) 158/46 (!) 148/49  Pulse:  69 73 65  Resp:  15 18 16   Temp:  SpO2:  98% 97% 100%  Weight: 74.8 kg     Height: 5\' 1"  (1.549 m)       Constitutional: NAD, alert and oriented x 3.  Looks older than stated age.  Has a flat affect Eyes: PERRL, lids and conjunctivae pallor ENMT: Mucous membranes are dry Neck: normal, supple, no masses, no thyromegaly Respiratory: clear to auscultation bilaterally, no wheezing, no crackles. Normal respiratory effort. No accessory muscle use.  Cardiovascular: Regular rate and rhythm, no murmurs / rubs / gallops. No extremity edema. 2+ pedal pulses. No carotid bruits.  Abdomen: no tenderness, no masses palpated. No  hepatosplenomegaly. Bowel sounds positive.  Central adiposity Musculoskeletal: no clubbing / cyanosis. No joint deformity upper and lower extremities.  Skin: no rashes, lesions, ulcers.  Neurologic: No gross focal neurologic deficit.  Able to move all extremities Psychiatric: Flat mood and affect.   Labs on Admission: I have personally reviewed following labs and imaging studies  CBC: Recent Labs  Lab 02/01/20 2344  WBC 6.3  NEUTROABS 3.9  HGB 12.6  HCT 38.1  MCV 79.5*  PLT 331   Basic Metabolic Panel: Recent Labs  Lab 02/01/20 2344  NA 135  K 4.5  CL 99  CO2 25  GLUCOSE 155*  BUN 39*  CREATININE 2.42*  CALCIUM 9.4   GFR: Estimated Creatinine Clearance: 21.7 mL/min (A) (by C-G formula based on SCr of 2.42 mg/dL (H)). Liver Function Tests: Recent Labs  Lab 02/01/20 2344  AST 23  ALT 21  ALKPHOS 76  BILITOT 0.5  PROT 8.0  ALBUMIN 4.1   No results for input(s): LIPASE, AMYLASE in the last 168 hours. No results for input(s): AMMONIA in the last 168 hours. Coagulation Profile: Recent Labs  Lab 02/01/20 2344  INR 1.0   Cardiac Enzymes: No results for input(s): CKTOTAL, CKMB, CKMBINDEX, TROPONINI in the last 168 hours. BNP (last 3 results) No results for input(s): PROBNP in the last 8760 hours. HbA1C: No results for input(s): HGBA1C in the last 72 hours. CBG: No results for input(s): GLUCAP in the last 168 hours. Lipid Profile: No results for input(s): CHOL, HDL, LDLCALC, TRIG, CHOLHDL, LDLDIRECT in the last 72 hours. Thyroid Function Tests: No results for input(s): TSH, T4TOTAL, FREET4, T3FREE, THYROIDAB in the last 72 hours. Anemia Panel: No results for input(s): VITAMINB12, FOLATE, FERRITIN, TIBC, IRON, RETICCTPCT in the last 72 hours. Urine analysis:    Component Value Date/Time   COLORURINE YELLOW (A) 02/02/2020 0943   APPEARANCEUR HAZY (A) 02/02/2020 0943   LABSPEC 1.009 02/02/2020 0943   PHURINE 6.0 02/02/2020 0943   GLUCOSEU 50 (A)  02/02/2020 0943   HGBUR SMALL (A) 02/02/2020 0943   BILIRUBINUR NEGATIVE 02/02/2020 0943   KETONESUR NEGATIVE 02/02/2020 0943   PROTEINUR NEGATIVE 02/02/2020 0943   NITRITE POSITIVE (A) 02/02/2020 0943   LEUKOCYTESUR LARGE (A) 02/02/2020 0943    Radiological Exams on Admission: DG Chest 1 View  Result Date: 02/02/2020 CLINICAL DATA:  Weakness EXAM: CHEST  1 VIEW COMPARISON:  None. FINDINGS: The heart size and mediastinal contours are within normal limits. Aortic knob calcifications are seen. Overlying median sternotomy wires are present. Both lungs are clear. The visualized skeletal structures are unremarkable. IMPRESSION: No active disease. Electronically Signed   By: 14/11/2019 M.D.   On: 02/02/2020 00:19   CT HEAD WO CONTRAST  Result Date: 02/02/2020 CLINICAL DATA:  Weakness and left arm tingling. EXAM: CT HEAD WITHOUT CONTRAST TECHNIQUE: Contiguous axial images were obtained from the base of the  skull through the vertex without intravenous contrast. COMPARISON:  None. FINDINGS: Brain: No evidence of acute infarction, hemorrhage, hydrocephalus, extra-axial collection or mass lesion/mass effect. Vascular: No hyperdense vessel or unexpected calcification. Skull: Normal. Negative for fracture or focal lesion. Sinuses/Orbits: No acute finding. Other: None. IMPRESSION: No acute intracranial pathology. Electronically Signed   By: Aram Candela M.D.   On: 02/02/2020 00:30    EKG: Independently reviewed.  Sinus rhythm with diffuse T wave inversions  Assessment/Plan Principal Problem:   TIA (transient ischemic attack) Active Problems:   Diabetes mellitus without complication (HCC)   Hypertension    TIA rule out an acute stroke Patient presents for evaluation of left arm weakness and tingling Symptoms have been present for close to 24 hours Initial CT scan of the head without contrast shows no intracranial bleed Place patient on aspirin and high intensity statins Obtain MRI of the  brain to rule out an acute stroke Obtain carotid Doppler and 2D echocardiogram We will request neurology consult if MRI is positive for an acute stroke We will request speech therapy/Occupational Therapy/physical therapy consult Allow for permissive hypertension until acute stroke is ruled out    Diabetes mellitus Maintain consistent carbohydrate diet Glycemic control with sliding scale insulin Accu-Cheks every 4 hours   Hypertension Hold all antihypertensive medications for now until an acute stroke is ruled out    Urinary tract infection Patient with significant pyuria Treat empirically with Rocephin until urine culture results become available   Acute kidney injury At baseline patient has serum creatinine of 1.0 and today on admission it is 2.4 Hold hydrochlorothiazide and spironolactone We will hydrate.   Repeat renal parameters in a.m.    Depression Continue fluoxetine  DVT prophylaxis: SCD Code Status: Full code Family Communication: Greater than 50% of time was spent discussing plan of care with patient at bedside.  All questions and concerns have been addressed.  She verbalizes understanding and agrees with the plan. Disposition Plan: Back to previous home environment Consults called: Neurology    Lucile Shutters MD [facility]     02/02/2020, 10:43 AM

## 2020-02-02 NOTE — Progress Notes (Signed)
OT Cancellation Note  Patient Details Name: Brendy Ficek MRN: [id number] DOB: November 08, 1955   Cancelled Treatment:    Reason Eval/Treat Not Completed: Patient at procedure or test/ unavailable. Consult received, chart reviewed. Pt out for imaging. Will re-attempt OT evaluation at later date/time as pt is available and medically appropriate.   Richrd Prime, MPH, MS, OTR/L ascom [phone] 02/02/20, 11:24 AM

## 2020-02-03 ENCOUNTER — Inpatient Hospital Stay: Payer: BLUE CROSS/BLUE SHIELD

## 2020-02-03 ENCOUNTER — Observation Stay
Admit: 2020-02-03 | Discharge: 2020-02-03 | Disposition: A | Discharge status: Home / Self Care | Payer: BLUE CROSS/BLUE SHIELD | Attending: Internal Medicine | Admitting: Internal Medicine

## 2020-02-03 DIAGNOSIS — I63511 Cerebral infarction due to unspecified occlusion or stenosis of right middle cerebral artery: Secondary | ICD-10-CM

## 2020-02-03 DIAGNOSIS — Z888 Allergy status to other drugs, medicaments and biological substances status: Secondary | ICD-10-CM | POA: Diagnosis not present

## 2020-02-03 DIAGNOSIS — Z20822 Contact with and (suspected) exposure to covid-19: Secondary | ICD-10-CM | POA: Diagnosis present

## 2020-02-03 DIAGNOSIS — I6521 Occlusion and stenosis of right carotid artery: Secondary | ICD-10-CM | POA: Diagnosis present

## 2020-02-03 DIAGNOSIS — I2510 Atherosclerotic heart disease of native coronary artery without angina pectoris: Secondary | ICD-10-CM | POA: Diagnosis present

## 2020-02-03 DIAGNOSIS — Z7982 Long term (current) use of aspirin: Secondary | ICD-10-CM | POA: Diagnosis not present

## 2020-02-03 DIAGNOSIS — E860 Dehydration: Secondary | ICD-10-CM | POA: Diagnosis present

## 2020-02-03 DIAGNOSIS — I119 Hypertensive heart disease without heart failure: Secondary | ICD-10-CM | POA: Diagnosis present

## 2020-02-03 DIAGNOSIS — N179 Acute kidney failure, unspecified: Secondary | ICD-10-CM | POA: Diagnosis present

## 2020-02-03 DIAGNOSIS — E119 Type 2 diabetes mellitus without complications: Secondary | ICD-10-CM | POA: Diagnosis present

## 2020-02-03 DIAGNOSIS — Z952 Presence of prosthetic heart valve: Secondary | ICD-10-CM | POA: Diagnosis not present

## 2020-02-03 DIAGNOSIS — Z8249 Family history of ischemic heart disease and other diseases of the circulatory system: Secondary | ICD-10-CM | POA: Diagnosis not present

## 2020-02-03 DIAGNOSIS — G459 Transient cerebral ischemic attack, unspecified: Secondary | ICD-10-CM | POA: Diagnosis not present

## 2020-02-03 DIAGNOSIS — D869 Sarcoidosis, unspecified: Secondary | ICD-10-CM | POA: Diagnosis present

## 2020-02-03 DIAGNOSIS — Z79899 Other long term (current) drug therapy: Secondary | ICD-10-CM | POA: Diagnosis not present

## 2020-02-03 DIAGNOSIS — I639 Cerebral infarction, unspecified: Secondary | ICD-10-CM | POA: Diagnosis present

## 2020-02-03 DIAGNOSIS — E7800 Pure hypercholesterolemia, unspecified: Secondary | ICD-10-CM | POA: Diagnosis present

## 2020-02-03 DIAGNOSIS — R29701 NIHSS score 1: Secondary | ICD-10-CM | POA: Diagnosis present

## 2020-02-03 DIAGNOSIS — R29810 Facial weakness: Secondary | ICD-10-CM | POA: Diagnosis present

## 2020-02-03 DIAGNOSIS — N390 Urinary tract infection, site not specified: Secondary | ICD-10-CM | POA: Diagnosis present

## 2020-02-03 DIAGNOSIS — Z794 Long term (current) use of insulin: Secondary | ICD-10-CM | POA: Diagnosis not present

## 2020-02-03 DIAGNOSIS — F32A Depression, unspecified: Secondary | ICD-10-CM | POA: Diagnosis present

## 2020-02-03 DIAGNOSIS — E785 Hyperlipidemia, unspecified: Secondary | ICD-10-CM | POA: Diagnosis present

## 2020-02-03 DIAGNOSIS — G8324 Monoplegia of upper limb affecting left nondominant side: Secondary | ICD-10-CM | POA: Diagnosis present

## 2020-02-03 DIAGNOSIS — I672 Cerebral atherosclerosis: Secondary | ICD-10-CM | POA: Diagnosis present

## 2020-02-03 DIAGNOSIS — Z951 Presence of aortocoronary bypass graft: Secondary | ICD-10-CM | POA: Diagnosis not present

## 2020-02-03 LAB — GLUCOSE, CAPILLARY
Glucose-Capillary: 105 mg/dL — ABNORMAL HIGH (ref 70–99)
Glucose-Capillary: 108 mg/dL — ABNORMAL HIGH (ref 70–99)
Glucose-Capillary: 111 mg/dL — ABNORMAL HIGH (ref 70–99)
Glucose-Capillary: 122 mg/dL — ABNORMAL HIGH (ref 70–99)
Glucose-Capillary: 136 mg/dL — ABNORMAL HIGH (ref 70–99)
Glucose-Capillary: 190 mg/dL — ABNORMAL HIGH (ref 70–99)

## 2020-02-03 LAB — ECHOCARDIOGRAM COMPLETE
AR max vel: 1.56 cm2
AV Peak grad: 7.4 mmHg
Ao pk vel: 1.36 m/s
Area-P 1/2: 4.17 cm2
S' Lateral: 2.85 cm

## 2020-02-03 LAB — LIPID PANEL
Cholesterol: 150 mg/dL (ref 0–200)
HDL: 39 mg/dL — ABNORMAL LOW (ref >40)
LDL Cholesterol: 91 mg/dL (ref 0–99)
Total CHOL/HDL Ratio: 3.8 RATIO
Triglycerides: 100 mg/dL (ref <150)
VLDL: 20 mg/dL (ref 0–40)

## 2020-02-03 LAB — HEMOGLOBIN A1C
Hgb A1c MFr Bld: 9.4 % — ABNORMAL HIGH (ref 4.8–5.6)
Mean Plasma Glucose: 223.08 mg/dL

## 2020-02-03 IMAGING — MR MR MRA NECK W/O CM
1 of 2 series · 20 of 48 positions shown · non-contrast
Comparison: None.

CLINICAL DATA: 64-year-old female with left side neurologic
deficits. Brain MRI yesterday revealing several punctate acute
infarcts in the right hemisphere. Intracranial MRA with multifocal
high-grade intracranial stenoses.

EXAM:
MRA NECK WITHOUT CONTRAST
TECHNIQUE: Angiographic images of the neck were obtained using MRA technique
without intravenous contrast. Carotid stenosis measurements (when
applicable) are obtained utilizing NASCET criteria, using the distal
internal carotid diameter as the denominator.

[Series 9: angio_[id number]_[id number]_ttc=2.0s · coronal · B · 0.9mm · 0.85mm/px · 20 of 96 slices shown]
[im 1/96]
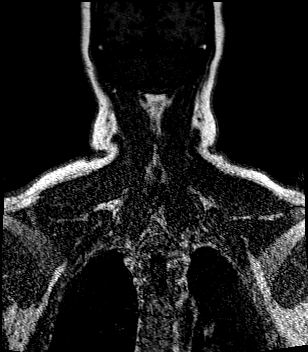
[im 6/96]
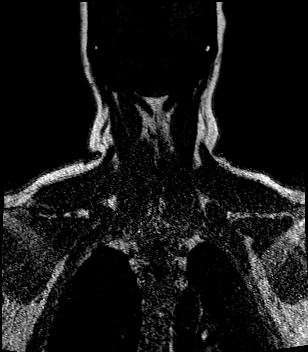
[im 11/96]
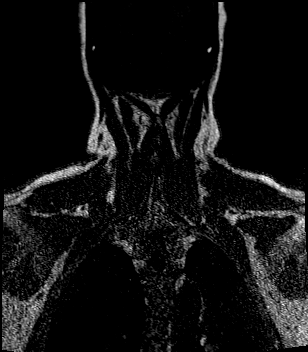
[im 16/96]
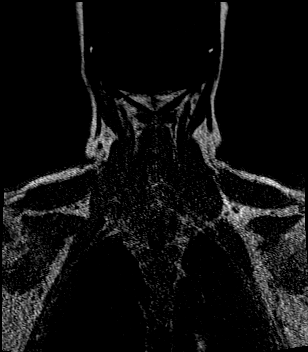
[im 21/96]
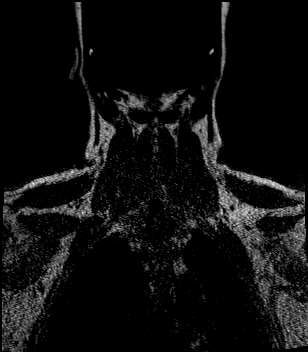
[im 26/96]
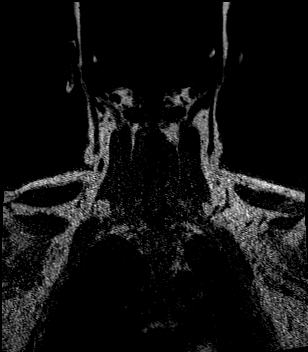
[im 31/96]
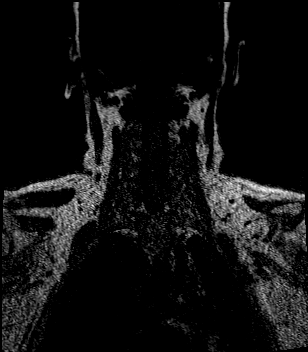
[im 36/96]
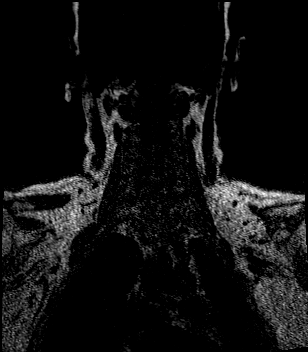
[im 41/96]
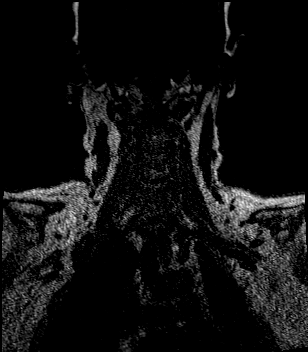
[im 46/96]
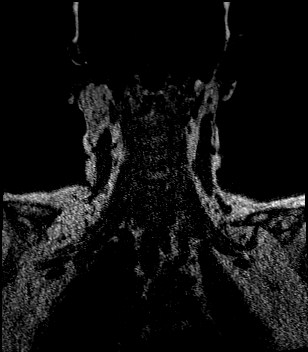
[im 51/96]
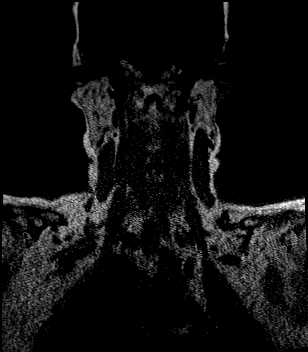
[im 56/96]
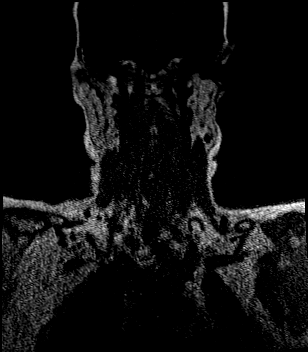
[im 61/96]
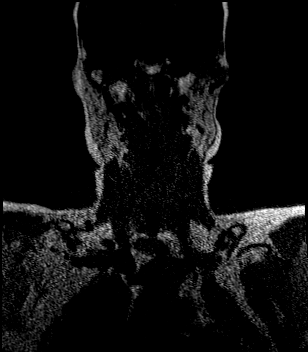
[im 66/96]
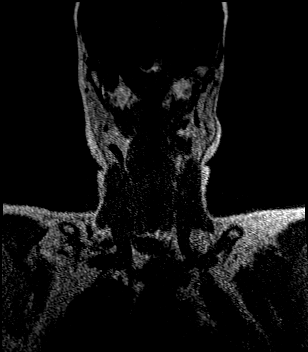
[im 71/96]
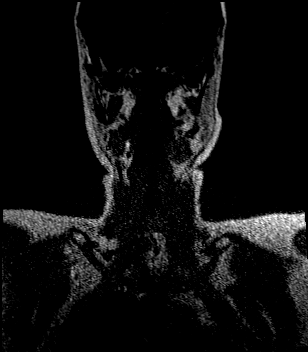
[im 76/96]
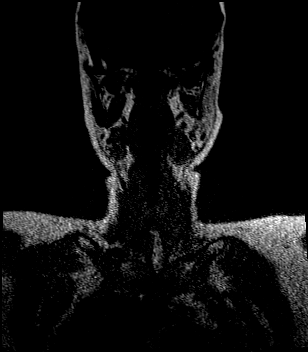
[im 81/96]
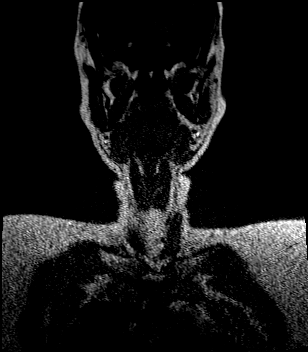
[im 86/96]
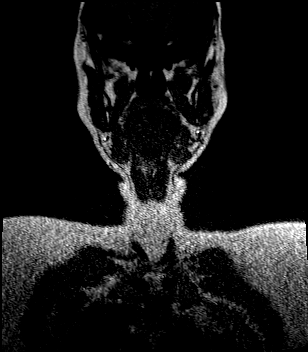
[im 91/96]
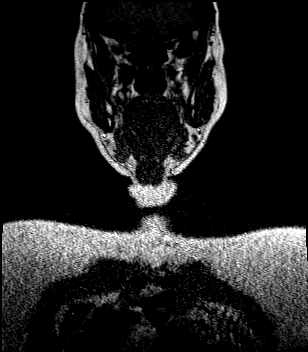
[im 96/96]
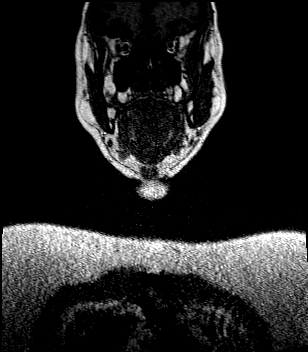

[20 of 48 positions shown; findings below may reference images not displayed]

FINDINGS: Time-of-flight neck MRA imaging. Antegrade flow in the bilateral
cervical carotid arteries to the skull base. Antegrade flow in the
right vertebral artery which has a dominant appearance. Only faint
antegrade flow signal is evident in the cervical left vertebral
artery, which appears irregular and highly attenuated at the C1
cervical level.

No stenosis in the visible dominant right vertebral artery.

Partially retropharyngeal course of the right carotid. The right ICA
origin is patent, but there is radiographic string sign stenosis at
the right ICA bulb (series 10, image 82) with highly attenuated
appearance of the vessel along a segment of up to the 7 mm. The
cervical right ICA remains patent.

At the left carotid bifurcation there is no significant left ICA
stenosis.
IMPRESSION: 1. Positive for High-grade stenosis at the Right ICA bulb,
RADIOGRAPHIC STRING SIGN.

2. Poor flow in a non-dominant Left Vertebral Artery, which might be
functionally occluded at the C1 level.

3. No evidence of cervical Left ICA or Right Vertebral Artery
stenosis.

## 2020-02-03 NOTE — Progress Notes (Addendum)
PROGRESS NOTE  Danielle Paul VZD:[id number] DOB: [birth year] DOA: 02/02/2020 PCP: [facility]  Brief History   Danielle Paul is a 64 y.o. female with medical history significant for coronary artery disease, hypertension, aortic stenosis status post TAVR, history of sarcoid and diabetes mellitus who presents to the ER for evaluation of left arm weakness and numbness that started around noon the day prior to her admission.  She also had associated tingling in her left arm.  She states that she initially was unable to lift her left arm but that has improved and at the time of this history and physical she is able to move that left arm but continues to complain of tingling.  She denies any left lower extremity weakness or tingling, she denies having any falls, no loss of consciousness, no blurry vision, no headache or difficulty swallowing. She denies having any chest pain, no shortness of breath, no fever, no chills, no nausea, no vomiting, no changes in her bowel habits or any urinary symptoms. Labs show sodium 135, potassium 4.5, chloride 99, bicarb 25, glucose 155, BUN 39, creatinine 2.42, calcium 9.4, alkaline phosphatase 76, albumin 4.1, AST 23, ALT 21, calcium 8.0, white count 6.3, hemoglobin 12.6, hematocrit 38.1, MCV 79.5, RDW 12.6, platelet count 331 Respiratory viral panel is negative Urine analysis shows pyuria with positive nitrites and leukocyte esterase CT scan of the head without contrast shows no acute intracranial pathology. Chest x-ray reviewed by me shows no active disease. Twelve-lead EKG reviewed by me shows normal sinus rhythm with diffuse T wave inversions  Patient is a 64 year old female with a history of diabetes mellitus and hypertension who presents to the emergency room for evaluation of left arm weakness and tingling which started around noon on the day prior to her admission.  Left arm weakness has improved but not resolved completely and tingling persists.   She will be referred to observation status for further evaluation.  The patient was admitted to a telemetry bed. MRI brain demonstrated : 1. Subcentimeter acute cortical/subcortical infarcts within the posterior right frontal lobe and right parietal lobe (involving the pre and postcentral gyri). 2. Additional punctate acute right parietooccipital cortical infarct. 3. Mild cerebral atrophy and chronic small vessel ischemic disease.  MRA Brain demonstrated intracranial atherosclerotic disease with: 1) Findings suggestive of high-grade stenosis or occlusion of prox V4 left vertebral artery 2) Moderate right vertebral artery stenosis at the level of the skull base 3) moderate/severe stenoses within the prox P2 posterior cerebral arteries bilaterally 4) Severe stenoses within the proximal cavernous segments of both intracranial internal carotid arteries with moderate stenosis within the cavernous right ICA more distally. 5) moderate stenosis within an inferior division prox M2 right MCA. 6) 1-2 mm vascular protrusion arising from the pre-cavernous left ICA  Neurology has been consulted as has vascular surgery.  Consultants  . Neurology . Vascular surgery  Procedures  . None  Antibiotics   Anti-infectives (From admission, onward)   Start     Dose/Rate Route Frequency Ordered Stop   02/02/20 1045  cefTRIAXone (ROCEPHIN) 1 g in sodium chloride 0.9 % 100 mL IVPB        1 g 200 mL/hr over 30 Minutes Intravenous Every 24 hours 02/02/20 1035      .   Subjective  The patient is resting quietly. No new complaints.  Objective   Vitals:  Vitals:   02/03/20 0337 02/03/20 0832  BP: (!) 141/71 140/69  Pulse: 71 72  Resp: 17 16  Temp:  97.9 F (36.6 C) 98.3 F (36.8 C)  SpO2: 99% 97%   Exam:  Constitutional:  . The patient is awake, alert, and oriented x 3. No acute distress. Respiratory:  . No increased work of breathing. . No wheezes, rales, or rhonchi . No tactile  fremitus Cardiovascular:  . Regular rate and rhythm . No murmurs, ectopy, or gallups. . No lateral PMI. No thrills. Abdomen:  . Abdomen is soft, non-tender, non-distended . No hernias, masses, or organomegaly . Normoactive bowel sounds.  Musculoskeletal:  . No cyanosis, clubbing, or edema Skin:  . No rashes, lesions, ulcers . palpation of skin: no induration or nodules Neurologic:  . CN 2-12 intact . Sensation all 4 extremities intact Psychiatric:  . Mental status o Mood, affect appropriate o Orientation to person, place, time  . judgment and insight appear intact  I have personally reviewed the following:   Today's Data  . Vitals, lipid panel  Imaging  . CT head . MRI brain . MRA brain  Cardiology Data  . Echocardiogram: EF 60-65% with Grade 1 Diastolic dysfunction. Right and left atrium are dilated. No intracardiac source of emboli is seen.  Scheduled Meds: . aspirin  300 mg Rectal Daily   Or  . aspirin EC  325 mg Oral Daily  . clopidogrel  75 mg Oral Daily  . FLUoxetine  10 mg Oral Daily  . insulin aspart  0-15 Units Subcutaneous TID WC  . rosuvastatin  40 mg Oral Daily  . sodium chloride flush  3 mL Intravenous Once   Continuous Infusions: . sodium chloride    . sodium chloride 100 mL/hr at 02/03/20 0922  . cefTRIAXone (ROCEPHIN)  IV 1 g (02/03/20 1025)    Principal Problem:   TIA (transient ischemic attack) Active Problems:   Diabetes mellitus without complication (HCC)   Hypertension   AKI (acute kidney injury) (HCC)   Acute lower UTI   LOS: 0 days   A & P  Acute CVA to right frontal lobe and acute punctate infarct to the parieto-occipital cortex.  Patient presented for evaluation of left arm weakness and tingling. MRA demonstrates significant stenosis to Right ICA. Vascular surgery is consulted and CTA neck has been ordered as per their request. She is receiving aspirin and high intensity statin. Permissive hypertension is being observed.  Echocardiogram has been completed. It demonstrates: EF 60-65% with Grade 1 Diastolic dysfunction. Right and left atrium are dilated. No intracardiac source of emboli is seen. The patient has been cleared for a diet by speech. PT/OT eval has been ordered.  Diabetes mellitus II: HbA1c is 9.4. Her glucoses are controlled with FSBS and SSI. She is on a modified carbohydrate diet with hypoglycemic protocol. Glucoses have been from 105-135 in the last 24 hours.  Hypertension: Hold all antihypertensive medications. Permissive hypertension observed in the setting of acute CVA.  Urinary tract infection: Patient with significant pyuria. Asymptomatic. Treat empirically with Rocephin until urine culture results become available.  Acute kidney injury: Continue IV fluids. Monitor electrolytes, creatinine, and volume status. Avoid nephrotoxins and hypotension.   Depression: Continue fluoxetine  I have seen and examined this patient myself. I have spent 35 minutes in his evaluation and care.  DVT prophylaxis: SCD Code Status: Full code Family Communication: None available. Disposition Plan: Back to previous home environment  Ava Swayze, DO [facility] Direct contact: see [email or web address]  7PM-7AM contact night coverage as above 02/03/2020, 12:37 AM  LOS: 0 days

## 2020-02-03 NOTE — Progress Notes (Signed)
*  PRELIMINARY RESULTS* Echocardiogram 2D Echocardiogram has been performed.  Danielle Paul Delphina Schum 02/03/2020, 10:23 AM

## 2020-02-03 NOTE — Consult Note (Signed)
NEUROLOGY CONSULTATION NOTE   Date of service: February 03, 2020 Patient Name: Danielle Paul MRN:  [id number] DOB:  05-04-1955 Reason for consult: "Stroke" _ _ _   _ __   _ __ _ _  __ __   _ __   __ _  History of Present Illness  Danielle Paul is a 64 y.o. female with PMH significant for diabetes, sarcoidosis, HTN, HLD who presents with left arm weakness and numbness with a LKW of 01/26/20. MRI Brain with acute/subacute cortical posterior R frontal lobe and Right parietal lobe infarcts and an additional punctate R parieto-occipital cortical infarct.  She states that she had an episode of R finger tip numbnes sthat went up into her forearm on 01/24/20. She was able to shake it off and did not think too much about it. She was just laying in the bed when it started. It lasted a few hours. She had another similar episode on 01/26/20 with numbness in her finger, hand and forearm and she could not lift her left arm up, it kept falling down this lasted for about 12 hours and went away but then she noted that her left face felt funny and had some numbness on the left side of her mouth. She spent the weekend with her family, she googled her symptoms, called EMS and came to the hospital  Workup with MR Angio head and carotid duplex demosntrates a greater than 70% stenosis of the proximal R ICA. TTE is pending. She was started on Aspirin 81mg  and Plavix 75mg  daily.  She has a known history of muscle pain to statins including atorvastatin, simvastatin and pravastatin. She does not smoke, no EtOh use, no prior hx of strokes.  NIHSS: 1 for mild L facial asymmetry MRS: 0 LKW: 01/26/20. TPA: outside the window Thrombectomy: No, outside the window, no LVO   ROS   Constitutional Denies weight loss, fever and chills.  HEENT Denies changes in vision and hearing.  Respiratory Denies SOB and cough.  CV Denies palpitations and CP  GI Denies abdominal pain, nausea, vomiting and diarrhea.  GU Denies dysuria and urinary  frequency.  MSK Denies myalgia and joint pain.  Skin Denies rash and pruritus.  Neurological Denies headache and syncope.  Psychiatric Denies recent changes in mood. Denies anxiety and depression.   Past History   Past Medical History:  Diagnosis Date  . Diabetes mellitus without complication (HCC)   . Hypercholesteremia   . Hypertension    Past Surgical History:  Procedure Laterality Date  . AORTIC VALVE REPLACEMENT (AVR)/CORONARY ARTERY BYPASS GRAFTING (CABG)  2009   Family History  Problem Relation Age of Onset  . Other Mother   . Hypertension Mother   . Hypertension Father    Social History   Socioeconomic History  . Marital status: Unknown    Spouse name: Not on file  . Number of children: Not on file  . Years of education: Not on file  . Highest education level: Not on file  Occupational History  . Not on file  Tobacco Use  . Smoking status: Never Smoker  . Smokeless tobacco: Never Used  Substance and Sexual Activity  . Alcohol use: Never  . Drug use: Never  . Sexual activity: Not on file  Other Topics Concern  . Not on file  Social History Narrative  . Not on file   Social Determinants of Health   Financial Resource Strain: Not on file  Food Insecurity: Not on file  Transportation Needs:  Not on file  Physical Activity: Not on file  Stress: Not on file  Social Connections: Not on file   Allergies  Allergen Reactions  . Atorvastatin Other (See Comments)    Muscle Pains  . Isosorbide Nitrate Other (See Comments)    Jittery and lightheadedness.  . Pravastatin Other (See Comments)  . Simvastatin Other (See Comments)    Muscle Pains    Medications   Medications Prior to Admission  Medication Sig Dispense Refill Last Dose  . amLODipine (NORVASC) 10 MG tablet Take 10 mg by mouth daily.   02/01/2020 at Unknown time  . aspirin 81 MG EC tablet Take 81 mg by mouth daily.     . carvedilol (COREG) 25 MG tablet Take 25 mg by mouth 2 (two) times daily.    02/01/2020 at 1800  . FLUoxetine (PROZAC) 10 MG tablet Take 10 mg by mouth daily.   02/01/2020 at Unknown  . hydrochlorothiazide (HYDRODIURIL) 25 MG tablet Take 12.5 mg by mouth daily.   02/01/2020 at 0800  . insulin lispro (HUMALOG) 100 UNIT/ML injection Inject 3-9 Units into the skin 3 (three) times daily before meals. (based upon blood glucose reading)   Asdirected at Unknown  . LANTUS SOLOSTAR 100 UNIT/ML Solostar Pen Inject 30 Units into the skin at bedtime.   02/01/2020 at 2100  . rosuvastatin (CRESTOR) 40 MG tablet Take 40 mg by mouth daily.   02/01/2020 at Unknown  . spironolactone (ALDACTONE) 25 MG tablet Take 25 mg by mouth daily.   02/01/2020 at Unknown     Vitals   Vitals:   02/02/20 2056 02/02/20 2329 02/03/20 0337 02/03/20 0832  BP: (!) 142/52 (!) 121/58 (!) 141/71 140/69  Pulse: 68 66 71 72  Resp: 16 18 17 16   Temp: 97.7 F (36.5 C) 98.2 F (36.8 C) 97.9 F (36.6 C) 98.3 F (36.8 C)  TempSrc:  Oral Oral Oral  SpO2: 100% 100% 99% 97%  Weight:      Height:         Body mass index is 31.18 kg/m.  Physical Exam   General: Laying comfortably in bed; in no acute distress.  HENT: Normal oropharynx and mucosa. Normal external appearance of ears and nose.  Neck: Supple, no pain or tenderness  CV: No JVD. No peripheral edema.  Pulmonary: Symmetric Chest rise. Normal respiratory effort.  Abdomen: Soft to touch, non-tender.  Ext: No cyanosis, edema, or deformity  Skin: No rash. Normal palpation of skin.   Musculoskeletal: Normal digits and nails by inspection. No clubbing.   Neurologic Examination  Mental status/Cognition: Alert, oriented to self, place, month and year, good attention.  Speech/language: Fluent, comprehension intact, object naming intact, repetition intact. Cranial nerves:   CN II Pupils equal and reactive to light, no VF deficits   CN III,IV,VI EOM intact, no gaze preference or deviation, no nystagmus    CN V normal sensation in V1, V2, and V3 segments  bilaterally    CN VII Mild L facial asymmetry, no nasolabial fold flattening    CN VIII normal hearing to speech    CN IX & X normal palatal elevation, no uvular deviation    CN XI 5/5 head turn and 5/5 shoulder shrug bilaterally    CN XII midline tongue protrusion    Motor:  Muscle bulk: normal, tone normal, pronator drift none tremor none Mvmt Root Nerve  Muscle Right Left Comments  SA C5/6 Ax Deltoid 5 5   EF C5/6 Mc Biceps 5 5  EE C6/7/8 Rad Triceps 5 5   WF C6/7 Med FCR 5 5   WE C7/8 PIN ECU 5 5   F Ab C8/T1 U ADM/FDI 5 5   HF L1/2/3 Fem Illopsoas 5 5   KE L2/3/4 Fem Quad 5 5   DF L4/5 D Peron Tib Ant 5 5   PF S1/2 Tibial Grc/Sol 5 5    Reflexes:  Right Left Comments  Pectoralis      Biceps (C5/6) 1 1   Brachioradialis (C5/6) 2 2    Triceps (C6/7) 1 1    Patellar (L3/4) 2 2    Achilles (S1) 1 1    Hoffman      Plantar     Jaw jerk    Sensation:  Light touch Intact throughout   Pin prick Intact throughout   Temperature    Vibration   Proprioception    Coordination/Complex Motor:  - Finger to Nose intact BL - Heel to shin intact BL - Rapid alternating movement are normal - Gait: deferred.  Labs   CBC:  Recent Labs  Lab 02/01/20 2344  WBC 6.3  NEUTROABS 3.9  HGB 12.6  HCT 38.1  MCV 79.5*  PLT 331    Basic Metabolic Panel:  Lab Results  Component Value Date   NA 135 02/01/2020   K 4.5 02/01/2020   CO2 25 02/01/2020   GLUCOSE 155 (H) 02/01/2020   BUN 39 (H) 02/01/2020   CREATININE 2.42 (H) 02/01/2020   CALCIUM 9.4 02/01/2020   GFRNONAA 22 (L) 02/01/2020   Lipid Panel:  Lab Results  Component Value Date   LDLCALC 91 02/03/2020   HgbA1c:  Lab Results  Component Value Date   HGBA1C 9.4 (H) 02/01/2020   Urine Drug Screen: No results found for: LABOPIA, COCAINSCRNUR, LABBENZ, AMPHETMU, THCU, LABBARB  Alcohol Level No results found for: ETH  CT Head without contrast: CTH was negative for a large hypodensity concerning for a large  territory infarct or hyperdensity concerning for an ICH  MRI Brain: Subcentimeter acute cortical/subcortical infarcts within the posterior right frontal lobe and right parietal lobe (involving the pre and postcentral gyri). Additional punctate acute cortical infarct more inferiorly and laterally within the right parietooccipital lobes.  MR Angio head: 1. Intracranial atherosclerotic disease with findings most notably as follows. 2. The proximal V4 left vertebral artery is poorly delineated, suggestive of high-grade stenosis or occlusion. 3. Moderate right vertebral artery stenosis at the level of the skull base. 4. Moderate/severe stenoses within the proximal P2 posterior cerebral arteries bilaterally. 5. Apparent severe stenoses within the proximal cavernous segments of both intracranial internal carotid arteries. Moderate stenosis within the cavernous right ICA more distally. 6. Moderate stenosis within an inferior division proximal M2 right MCA vessel. 7. 1-2 mm vascular protrusion arising from the pre-avernous left ICA which may reflect atherosclerotic lobulation or a small aneurysm.  Carotid Duplex: Right ICA stenosis estimated at greater than 70% by ultrasound criteria. Left ICA stenosis less than 50%.   Impression   Joesph FillersDoris Cake is a 64 y.o. female with PMH significant for diabetes, sarcoidosis, HTN, HLD who presents with left arm weakness and numbness with a LKW of 01/26/20. Her neurologic examination is notable for mild L facial asymmetry.  Workup revealed 2 cortical punctate infarcts in the R frontal and R parietal lobe. She has sevre intracranial stenosis of bilateral proximla cavernous ICA and has a Proximal R ICA stenosis of greater than 70% on carotid duplex. TTE with EF of 60-65%,  bubble study is ordered and pending.  Suspect that this is due to symptomatic extracranial Carotid stenosis given the presence of greater than 70% R ICA stenosis on carotid duplex and the  noted punctate R hemispheric infarcts. She also has severe BL intracranial stenosis but no infarcts in the left hemisphere however.  Impression: -Symptomatic R ICA extracranial Stenosis - Multifocal multivessel atherosclerosis - R MCA puntate infarcts.  Recommendations   - Frequent Neuro checks per protocol - TTE with EF of 60-65%, bubble study is pending. - LDL of 91 but she has had muscle cramps to multiple statin medications in the past. - HbA1c of 9.4 - Antithrombotic - Continue Aspirin 81mg  daily along with plavix 75mg  daily for 3 weeks, followed by Aspirin 81mg  daily alone. - Recommend DVT ppx - SBP goal - permissive hypertension first 24 h < 220/110. Held home meds.  - Recommend Telemetry monitoring for arrythmia - Recommend bedside swallow screen prior to PO intake. - Stroke education booklet - Recommend PT/OT/SLP consult - Recommend Vascular surgery consult for Symptomatic greater than 70% extracranial R ICA stenosis to assess for potential intervention. Per AHA/ASA guidelines, intervention should ideally be done within 2 weeksof their last ischemic event. Guidelines listed below.  https://www.ahajournals.org/doi/full/10.1161/STR.0000000000000375?rfr_dat=cr_pub++0pubmed&url_ver=Z39.88-2003&rfr_id=ori%3Arid%3Acrossref.org   5.1.2.1. Extracranial Carotid Stenosis: Synopsis   Previous randomized clinical trials have compared CEA with best medical therapy in patients with a recent stroke or TIA. A combined analysis of these trials found the greatest benefit for CEA was in severe (70%-99%) ICA stenosis with recent symptoms. Post hoc analysis of these original trials found a greater benefit of CEA when the surgery was done in patients who were enrolled within 2 weeks of their last ischemic event.      ______________________________________________________________________   Thank you for the opportunity to take part in the care of this patient. If you have any further questions,  please contact the neurology consultation attending.  Signed,  [facility] Pager Number _ _ _   _ __   _ __ _ _  __ __   _ __   __ _

## 2020-02-03 NOTE — Plan of Care (Deleted)
N/A areas: Comfort care pt.

## 2020-02-04 DIAGNOSIS — G459 Transient cerebral ischemic attack, unspecified: Secondary | ICD-10-CM

## 2020-02-04 LAB — BASIC METABOLIC PANEL
Anion gap: 8 (ref 5–15)
BUN: 37 mg/dL — ABNORMAL HIGH (ref 8–23)
CO2: 21 mmol/L — ABNORMAL LOW (ref 22–32)
Calcium: 8.7 mg/dL — ABNORMAL LOW (ref 8.9–10.3)
Chloride: 104 mmol/L (ref 98–111)
Creatinine, Ser: 1.63 mg/dL — ABNORMAL HIGH (ref 0.44–1.00)
GFR, Estimated: 35 mL/min — ABNORMAL LOW (ref >60)
Glucose, Bld: 99 mg/dL (ref 70–99)
Potassium: 4.1 mmol/L (ref 3.5–5.1)
Sodium: 133 mmol/L — ABNORMAL LOW (ref 135–145)

## 2020-02-04 LAB — CBC WITH DIFFERENTIAL/PLATELET
Abs Immature Granulocytes: 0.02 10*3/uL (ref 0.00–0.07)
Basophils Absolute: 0 10*3/uL (ref 0.0–0.1)
Basophils Relative: 1 %
Eosinophils Absolute: 0.1 10*3/uL (ref 0.0–0.5)
Eosinophils Relative: 2 %
HCT: 34.1 % — ABNORMAL LOW (ref 36.0–46.0)
Hemoglobin: 11.1 g/dL — ABNORMAL LOW (ref 12.0–15.0)
Immature Granulocytes: 0 %
Lymphocytes Relative: 37 %
Lymphs Abs: 2.1 10*3/uL (ref 0.7–4.0)
MCH: 26.1 pg (ref 26.0–34.0)
MCHC: 32.6 g/dL (ref 30.0–36.0)
MCV: 80.2 fL (ref 80.0–100.0)
Monocytes Absolute: 0.6 10*3/uL (ref 0.1–1.0)
Monocytes Relative: 10 %
Neutro Abs: 3 10*3/uL (ref 1.7–7.7)
Neutrophils Relative %: 50 %
Platelets: 289 10*3/uL (ref 150–400)
RBC: 4.25 MIL/uL (ref 3.87–5.11)
RDW: 13.1 % (ref 11.5–15.5)
WBC: 5.8 10*3/uL (ref 4.0–10.5)
nRBC: 0 % (ref 0.0–0.2)

## 2020-02-04 LAB — GLUCOSE, CAPILLARY
Glucose-Capillary: 100 mg/dL — ABNORMAL HIGH (ref 70–99)
Glucose-Capillary: 117 mg/dL — ABNORMAL HIGH (ref 70–99)
Glucose-Capillary: 122 mg/dL — ABNORMAL HIGH (ref 70–99)
Glucose-Capillary: 124 mg/dL — ABNORMAL HIGH (ref 70–99)
Glucose-Capillary: 81 mg/dL (ref 70–99)
Glucose-Capillary: 97 mg/dL (ref 70–99)

## 2020-02-04 MED ORDER — DOCUSATE SODIUM 100 MG PO CAPS
200.0000 mg | ORAL_CAPSULE | Freq: Once | ORAL | Status: AC
  Administered 2020-02-04: 17:00:00 200 mg via ORAL
  Filled 2020-02-04: qty 2

## 2020-02-04 MED ORDER — POLYETHYLENE GLYCOL 3350 17 G PO PACK
17.0000 g | PACK | Freq: Every day | ORAL | Status: DC
  Administered 2020-02-04 – 2020-02-06 (×3): 17 g via ORAL
  Filled 2020-02-04 (×2): qty 1

## 2020-02-04 NOTE — Progress Notes (Signed)
Brief Neuro Update:  Bubble study is pending. Appreciate Vascular surgery recs and plan for CEA this week.  If the bubble study is negative, no further inpatient workup at this time.  Recs: - Patient should follow up with Stroke clinic with Dr. Delia Heady with [facility]. - Patient should follow up with her PCP for elevated HbA1c. - LDL of 91 but she has had muscle cramps to multiple statin medications in the past. - Antithrombotic - Continue Aspirin 81mg  daily along with plavix 75mg  daily for 3 weeks, followed by Aspirin 81mg  daily alone.  [facility] Pager Number 

## 2020-02-04 NOTE — Plan of Care (Signed)
  Problem: Education: Goal: Knowledge of General Education information will improve Description: Including pain rating scale, medication(s)/side effects and non-pharmacologic comfort measures Outcome: Progressing   Problem: Coping: Goal: Level of anxiety will decrease Outcome: Progressing   Problem: Elimination: Goal: Will not experience complications related to urinary retention Outcome: Progressing   Problem: Pain Managment: Goal: General experience of comfort will improve Outcome: Progressing   Problem: Safety: Goal: Ability to remain free from injury will improve Outcome: Progressing   Problem: Skin Integrity: Goal: Risk for impaired skin integrity will decrease Outcome: Progressing   Problem: Education: Goal: Knowledge of disease or condition will improve Outcome: Progressing   Problem: Coping: Goal: Will verbalize positive feelings about self Outcome: Progressing Goal: Will identify appropriate support needs Outcome: Progressing

## 2020-02-04 NOTE — Consult Note (Signed)
[facility] Vascular Consult Note  MRN : [id number]  Danielle Paul is a 64 y.o. (1955-05-21) female who presents with chief complaint of  Chief Complaint  Patient presents with   Weakness  .  History of Present Illness: Patient with DM, HTN, HLD, history of CABG x3 and subsequent stenting presents with LEFT upper extremity weakness, numbness and tingling of fingertips that initially began 4 days prior to presentation. The symptoms resolved immediately but reoccurred 2 days later with Left sided facial droop. The upper extremity symptoms resolved, with residual mild left sided facial droop. She denied lower extremity weakness, slurred speech, chest pain, palpitations or other TIA or stroke like symptoms. Denies similar symptoms in the past.  Current Facility-Administered Medications  Medication Dose Route Frequency Provider Last Rate Last Admin   0.9 %  sodium chloride infusion   Intravenous Continuous Agbata, Tochukwu, MD       0.9 %  sodium chloride infusion   Intravenous Continuous Agbata, Tochukwu, MD 100 mL/hr at 02/04/20 0221 New Bag at 02/04/20 0221   acetaminophen (TYLENOL) tablet 650 mg  650 mg Oral Q4H PRN Agbata, Tochukwu, MD       Or   acetaminophen (TYLENOL) 160 MG/5ML solution 650 mg  650 mg Per Tube Q4H PRN Agbata, Tochukwu, MD       Or   acetaminophen (TYLENOL) suppository 650 mg  650 mg Rectal Q4H PRN Agbata, Tochukwu, MD       aspirin suppository 300 mg  300 mg Rectal Daily Agbata, Tochukwu, MD       Or   aspirin EC tablet 325 mg  325 mg Oral Daily Agbata, Tochukwu, MD   325 mg at 02/03/20 0925   cefTRIAXone (ROCEPHIN) 1 g in sodium chloride 0.9 % 100 mL IVPB  1 g Intravenous Q24H Agbata, Tochukwu, MD 200 mL/hr at 02/03/20 0928 1 g at 02/03/20 4098   clopidogrel (PLAVIX) tablet 75 mg  75 mg Oral Daily Erick Blinks, MD   75 mg at 02/03/20 1191   FLUoxetine (PROZAC) capsule 10 mg  10 mg Oral Daily Agbata, Tochukwu, MD   10 mg at 02/03/20 4782    insulin aspart (novoLOG) injection 0-15 Units  0-15 Units Subcutaneous TID WC Agbata, Tochukwu, MD   2 Units at 02/03/20 1643   rosuvastatin (CRESTOR) tablet 40 mg  40 mg Oral Daily Agbata, Tochukwu, MD   40 mg at 02/03/20 9562   sodium chloride flush (NS) 0.9 % injection 3 mL  3 mL Intravenous Once Gilles Chiquito, MD        Past Medical History:  Diagnosis Date   Diabetes mellitus without complication (HCC)    Hypercholesteremia    Hypertension     Past Surgical History:  Procedure Laterality Date   AORTIC VALVE REPLACEMENT (AVR)/CORONARY ARTERY BYPASS GRAFTING (CABG)  2009    Social History Social History   Tobacco Use   Smoking status: Never Smoker   Smokeless tobacco: Never Used  Substance Use Topics   Alcohol use: Never   Drug use: Never    Family History Family History  Problem Relation Age of Onset   Other Mother    Hypertension Mother    Hypertension Father     Allergies  Allergen Reactions   Atorvastatin Other (See Comments)    Muscle Pains   Isosorbide Nitrate Other (See Comments)    Jittery and lightheadedness.   Pravastatin Other (See Comments)   Simvastatin Other (See Comments)    Muscle  Pains     REVIEW OF SYSTEMS (Negative unless checked)  Constitutional: [] Weight loss  [] Fever  [] Chills Cardiac: [] Chest pain   [] Chest pressure   [] Palpitations   [] Shortness of breath when laying flat   [] Shortness of breath at rest   [] Shortness of breath with exertion. Vascular:  [] Pain in legs with walking   [] Pain in legs at rest   [] Pain in legs when laying flat   [] Claudication   [] Pain in feet when walking  [] Pain in feet at rest  [] Pain in feet when laying flat   [] History of DVT   [] Phlebitis   [] Swelling in legs   [] Varicose veins   [] Non-healing ulcers Pulmonary:   [] Uses home oxygen   [] Productive cough   [] Hemoptysis   [] Wheeze  [] COPD   [] Asthma Neurologic:  [] Dizziness  [] Blackouts   [] Seizures   [] History of stroke   [] History of TIA  [] Aphasia    [] Temporary blindness   [] Dysphagia   [x] Weakness or numbness in arms   [] Weakness or numbness in legs Musculoskeletal:  [] Arthritis   [] Joint swelling   [] Joint pain   [] Low back pain Hematologic:  [] Easy bruising  [] Easy bleeding   [] Hypercoagulable state   [] Anemic  [] Hepatitis Gastrointestinal:  [] Blood in stool   [] Vomiting blood  [] Gastroesophageal reflux/heartburn   [] Difficulty swallowing. Genitourinary:  [] Chronic kidney disease   [] Difficult urination  [] Frequent urination  [] Burning with urination   [] Blood in urine Skin:  [] Rashes   [] Ulcers   [] Wounds Psychological:  [] History of anxiety   []  History of major depression.  Physical Examination  Vitals:   02/03/20 1947 02/04/20 0000 02/04/20 0559 02/04/20 0728  BP: (!) 142/63 136/62 (!) 108/48 (!) 130/59  Pulse: 74 77 72 74  Resp: 16 16 16 17   Temp: 98.7 F (37.1 C) 98.7 F (37.1 C) 98.4 F (36.9 C) 98.5 F (36.9 C)  TempSrc: Oral Oral Oral   SpO2: 100% 100% 98% 99%  Weight:      Height:       Body mass index is 31.18 kg/m. Gen:  WD/WN, NAD Head: [place]/AT, No temporalis wasting. Prominent temp pulse not noted. Eyes: Sclera non-icteric, conjunctiva clear Neck: Trachea midline.  No JVD.  Pulmonary:  Good air movement, respirations not labored, equal bilaterally.  Cardiac: RRR, normal S1, S2. Vascular:  Vessel Right Left  Radial Palpable Palpable  Ulnar Palpable Palpable  Brachial Palpable Palpable  Carotid Palpable, bruit Palpable, without bruit  Aorta Not palpable N/A  Femoral Palpable Palpable  Popliteal    PT    DP Palpable Palpable   Gastrointestinal: soft, non-tender/non-distended. No guarding/reflex.  Musculoskeletal: M/S 5/5 throughout.  Extremities without ischemic changes.  No deformity or atrophy. No edema. Neurologic: Sensation grossly intact in extremities.  Symmetrical.  Speech is fluent. Motor exam as listed above. Mild Left facial asymmetry Psychiatric: Judgment intact, Mood & affect appropriate  for pt's clinical situation. Dermatologic: No rashes or ulcers noted.  No cellulitis or open wounds. Lymph : No Cervical, Axillary, or Inguinal lymphadenopathy.     CBC Lab Results  Component Value Date   WBC 5.8 02/04/2020   HGB 11.1 (L) 02/04/2020   HCT 34.1 (L) 02/04/2020   MCV 80.2 02/04/2020   PLT 289 02/04/2020    BMET    Component Value Date/Time   NA 133 (L) 02/04/2020 0524   K 4.1 02/04/2020 0524   CL 104 02/04/2020 0524   CO2 21 (L) 02/04/2020 0524   GLUCOSE 99 02/04/2020 0524  BUN 37 (H) 02/04/2020 0524   CREATININE 1.63 (H) 02/04/2020 0524   CALCIUM 8.7 (L) 02/04/2020 0524   GFRNONAA 35 (L) 02/04/2020 0524   Estimated Creatinine Clearance: 32.3 mL/min (A) (by C-G formula based on SCr of 1.63 mg/dL (H)).  COAG Lab Results  Component Value Date   INR 1.0 02/01/2020    Radiology DG Chest 1 View  Result Date: 02/02/2020 CLINICAL DATA:  Weakness EXAM: CHEST  1 VIEW COMPARISON:  None. FINDINGS: The heart size and mediastinal contours are within normal limits. Aortic knob calcifications are seen. Overlying median sternotomy wires are present. Both lungs are clear. The visualized skeletal structures are unremarkable. IMPRESSION: No active disease. Electronically Signed   By: Jonna Clark M.D.   On: 02/02/2020 00:19   CT HEAD WO CONTRAST  Result Date: 02/02/2020 CLINICAL DATA:  Weakness and left arm tingling. EXAM: CT HEAD WITHOUT CONTRAST TECHNIQUE: Contiguous axial images were obtained from the base of the skull through the vertex without intravenous contrast. COMPARISON:  None. FINDINGS: Brain: No evidence of acute infarction, hemorrhage, hydrocephalus, extra-axial collection or mass lesion/mass effect. Vascular: No hyperdense vessel or unexpected calcification. Skull: Normal. Negative for fracture or focal lesion. Sinuses/Orbits: No acute finding. Other: None. IMPRESSION: No acute intracranial pathology. Electronically Signed   By: Aram Candela M.D.   On:  02/02/2020 00:30   MR ANGIO HEAD WO CONTRAST  Result Date: 02/02/2020 CLINICAL DATA:  Neuro deficit, acute, stroke suspected. Additional history provided: Left arm tingling with weakness since yesterday morning. EXAM: MRI HEAD WITHOUT CONTRAST MRA HEAD WITHOUT CONTRAST TECHNIQUE: Multiplanar, multiecho pulse sequences of the brain and surrounding structures were obtained without intravenous contrast. Angiographic images of the head were obtained using MRA technique without contrast. COMPARISON:  Head CT 02/02/2020. FINDINGS: MRI HEAD FINDINGS Brain: Mild cerebral and cerebellar atrophy. Subcentimeter acute cortical/subcortical infarcts within the posterior right frontal lobe and right parietal lobe (involving the pre and postcentral gyri). Additional punctate acute cortical infarct more inferiorly and laterally within the right parietooccipital lobes. Mild multifocal T2/FLAIR hyperintensity within the cerebral white matter is nonspecific, but compatible with chronic small vessel ischemic disease. No evidence of intracranial mass. No chronic intracranial blood products. No extra-axial fluid collection. No midline shift. Partially empty sella turcica. Vascular: Reported below. Skull and upper cervical spine: No focal marrow lesion. Sinuses/Orbits: Visualized orbits show no acute finding. Prior lens replacement on the left. No significant paranasal sinus disease. MRA HEAD FINDINGS The intracranial internal carotid arteries are patent. Apparent severe stenoses within the proximal cavernous segments bilaterally (series 1067, images 5 and 15). Stenoses of the cavernous ICAs more distally (moderate right, mild left). The M1 middle cerebral arteries are patent. No M2 proximal branch occlusion is identified. Moderate stenosis within an inferior division proximal M2 right MCA branch vessel (series 1067, image 13). The anterior cerebral arteries are patent. 1-2 mm vascular protrusion arising from the pre-cavernous left  ICA, which may reflect atherosclerotic lobulation or a small aneurysm (series 13, image 64) (series 1061, image 14). Apparent moderate stenosis within the right vertebral artery at the level of the skull base. The proximal V4 left vertebral artery is poorly delineated, suggestive of high-grade stenosis or vessel occlusion at this site. However, flow related signal is present within the left PICA. The basilar artery is patent. The posterior cerebral arteries are patent. Moderate/severe stenoses within the proximal P2 posterior cerebral arteries bilaterally. IMPRESSION: MRI brain: 1. Subcentimeter acute cortical/subcortical infarcts within the posterior right frontal lobe and right parietal lobe (  involving the pre and postcentral gyri). 2. Additional punctate acute right parietooccipital cortical infarct. 3. Mild cerebral atrophy and chronic small vessel ischemic disease. MRA head: 1. Intracranial atherosclerotic disease with findings most notably as follows. 2. The proximal V4 left vertebral artery is poorly delineated, suggestive of high-grade stenosis or occlusion. 3. Moderate right vertebral artery stenosis at the level of the skull base. 4. Moderate/severe stenoses within the proximal P2 posterior cerebral arteries bilaterally. 5. Apparent severe stenoses within the proximal cavernous segments of both intracranial internal carotid arteries. Moderate stenosis within the cavernous right ICA more distally. 6. Moderate stenosis within an inferior division proximal M2 right MCA vessel. 7. 1-2 mm vascular protrusion arising from the pre-avernous left ICA which may reflect atherosclerotic lobulation or a small aneurysm. Electronically Signed   By: Jackey Loge DO   On: 02/02/2020 12:14   MR ANGIO NECK WO CONTRAST  Result Date: 02/03/2020 CLINICAL DATA:  64 year old female with left side neurologic deficits. Brain MRI yesterday revealing several punctate acute infarcts in the right hemisphere. Intracranial MRA with  multifocal high-grade intracranial stenoses. EXAM: MRA NECK WITHOUT CONTRAST TECHNIQUE: Angiographic images of the neck were obtained using MRA technique without intravenous contrast. Carotid stenosis measurements (when applicable) are obtained utilizing NASCET criteria, using the distal internal carotid diameter as the denominator. COMPARISON:  None. FINDINGS: Time-of-flight neck MRA imaging. Antegrade flow in the bilateral cervical carotid arteries to the skull base. Antegrade flow in the right vertebral artery which has a dominant appearance. Only faint antegrade flow signal is evident in the cervical left vertebral artery, which appears irregular and highly attenuated at the C1 cervical level. No stenosis in the visible dominant right vertebral artery. Partially retropharyngeal course of the right carotid. The right ICA origin is patent, but there is radiographic string sign stenosis at the right ICA bulb (series 10, image 82) with highly attenuated appearance of the vessel along a segment of up to the 7 mm. The cervical right ICA remains patent. At the left carotid bifurcation there is no significant left ICA stenosis. IMPRESSION: 1. Positive for High-grade stenosis at the Right ICA bulb, RADIOGRAPHIC STRING SIGN. 2. Poor flow in a non-dominant Left Vertebral Artery, which might be functionally occluded at the C1 level. 3. No evidence of cervical Left ICA or Right Vertebral Artery stenosis. Electronically Signed   By: Odessa Fleming M.D.   On: 02/03/2020 21:30   MR Brain Wo Contrast (neuro protocol)  Result Date: 02/02/2020 CLINICAL DATA:  Neuro deficit, acute, stroke suspected. Additional history provided: Left arm tingling with weakness since yesterday morning. EXAM: MRI HEAD WITHOUT CONTRAST MRA HEAD WITHOUT CONTRAST TECHNIQUE: Multiplanar, multiecho pulse sequences of the brain and surrounding structures were obtained without intravenous contrast. Angiographic images of the head were obtained using MRA  technique without contrast. COMPARISON:  Head CT 02/02/2020. FINDINGS: MRI HEAD FINDINGS Brain: Mild cerebral and cerebellar atrophy. Subcentimeter acute cortical/subcortical infarcts within the posterior right frontal lobe and right parietal lobe (involving the pre and postcentral gyri). Additional punctate acute cortical infarct more inferiorly and laterally within the right parietooccipital lobes. Mild multifocal T2/FLAIR hyperintensity within the cerebral white matter is nonspecific, but compatible with chronic small vessel ischemic disease. No evidence of intracranial mass. No chronic intracranial blood products. No extra-axial fluid collection. No midline shift. Partially empty sella turcica. Vascular: Reported below. Skull and upper cervical spine: No focal marrow lesion. Sinuses/Orbits: Visualized orbits show no acute finding. Prior lens replacement on the left. No significant paranasal sinus disease. MRA  HEAD FINDINGS The intracranial internal carotid arteries are patent. Apparent severe stenoses within the proximal cavernous segments bilaterally (series 1067, images 5 and 15). Stenoses of the cavernous ICAs more distally (moderate right, mild left). The M1 middle cerebral arteries are patent. No M2 proximal branch occlusion is identified. Moderate stenosis within an inferior division proximal M2 right MCA branch vessel (series 1067, image 13). The anterior cerebral arteries are patent. 1-2 mm vascular protrusion arising from the pre-cavernous left ICA, which may reflect atherosclerotic lobulation or a small aneurysm (series 13, image 64) (series 1061, image 14). Apparent moderate stenosis within the right vertebral artery at the level of the skull base. The proximal V4 left vertebral artery is poorly delineated, suggestive of high-grade stenosis or vessel occlusion at this site. However, flow related signal is present within the left PICA. The basilar artery is patent. The posterior cerebral arteries are  patent. Moderate/severe stenoses within the proximal P2 posterior cerebral arteries bilaterally. IMPRESSION: MRI brain: 1. Subcentimeter acute cortical/subcortical infarcts within the posterior right frontal lobe and right parietal lobe (involving the pre and postcentral gyri). 2. Additional punctate acute right parietooccipital cortical infarct. 3. Mild cerebral atrophy and chronic small vessel ischemic disease. MRA head: 1. Intracranial atherosclerotic disease with findings most notably as follows. 2. The proximal V4 left vertebral artery is poorly delineated, suggestive of high-grade stenosis or occlusion. 3. Moderate right vertebral artery stenosis at the level of the skull base. 4. Moderate/severe stenoses within the proximal P2 posterior cerebral arteries bilaterally. 5. Apparent severe stenoses within the proximal cavernous segments of both intracranial internal carotid arteries. Moderate stenosis within the cavernous right ICA more distally. 6. Moderate stenosis within an inferior division proximal M2 right MCA vessel. 7. 1-2 mm vascular protrusion arising from the pre-avernous left ICA which may reflect atherosclerotic lobulation or a small aneurysm. Electronically Signed   By: Jackey LogeKyle  Golden DO   On: 02/02/2020 12:14   [facility]  Result Date: 02/03/2020 CLINICAL DATA:  Carotid atherosclerosis, hypertension, hyperlipidemia and diabetes EXAM: BILATERAL CAROTID DUPLEX ULTRASOUND TECHNIQUE: Wallace CullensGray scale imaging, color Doppler and duplex ultrasound were performed of bilateral carotid and vertebral arteries in the neck. COMPARISON:  None. FINDINGS: Criteria: Quantification of carotid stenosis is based on velocity parameters that correlate the residual internal carotid diameter with NASCET-based stenosis levels, using the diameter of the distal internal carotid lumen as the denominator for stenosis measurement. The following velocity measurements were obtained: RIGHT ICA: 583/224  cm/sec CCA: 91/16 cm/sec SYSTOLIC ICA/CCA RATIO:  6.4 ECA: 315 cm/sec LEFT ICA: 122/30 cm/sec CCA: 131/26 cm/sec SYSTOLIC ICA/CCA RATIO:  0.9 ECA: 183 cm/sec RIGHT CAROTID ARTERY: Diffuse intimal thickening and moderate carotid atherosclerosis. Luminal narrowing noted of the right proximal ICA. In this region there is velocity elevation measuring 582/224 centimeters/second with turbulent flow and mild spectral broadening. Right ICA stenosis estimated at greater than 70% by ultrasound criteria. RIGHT VERTEBRAL ARTERY:  Normal antegrade flow LEFT CAROTID ARTERY: Similar intimal thickening and moderate atherosclerotic change. Despite this, there is no hemodynamically significant left ICA stenosis, velocity elevation, turbulent flow. Degree of narrowing less than 50% by ultrasound criteria. LEFT VERTEBRAL ARTERY: Remains antegrade with slight to and fro flow, indicative of some degree of vascular disease. IMPRESSION: Right ICA stenosis estimated at greater than 70% by ultrasound criteria. Left ICA stenosis less than 50%. Antegrade vertebral flow bilaterally, see above comment. Electronically Signed   By: Judie PetitM.  Shick M.D.   On: 02/03/2020 08:27   ECHOCARDIOGRAM COMPLETE  Result Date: 02/03/2020    ECHOCARDIOGRAM REPORT   Patient Name:   Danielle Paul Date of Exam: 02/03/2020 Medical Rec #:  [id number]   Height:       61.0 in Accession #:    [id number]  Weight:       165.0 lb Date of Birth:  May 21, 1955  BSA:          1.740 m Patient Age:    64 years    BP:           141/71 mmHg Patient Gender: F           HR:           75 bpm. Exam Location:  [facility] Procedure: 2D Echo Indications:     TIA G45.9  History:         Patient has no prior history of Echocardiogram examinations.  Sonographer:     Wonda Cerise RDCS Referring Phys:  [id number] AGBATA Diagnosing Phys: Adrian Blackwater MD IMPRESSIONS  1. Left ventricular ejection fraction, by estimation, is 60 to 65%. The left ventricle has normal function. The left ventricle has  no regional wall motion abnormalities. There is mild concentric left ventricular hypertrophy. Left ventricular diastolic parameters are consistent with Grade I diastolic dysfunction (impaired relaxation).  2. Right ventricular systolic function is normal. The right ventricular size is normal.  3. Left atrial size was mildly dilated.  4. Right atrial size was mildly dilated.  5. The mitral valve is normal in structure. Trivial mitral valve regurgitation. No evidence of mitral stenosis.  6. The aortic valve is normal in structure. Aortic valve regurgitation is not visualized. No aortic stenosis is present.  7. The inferior vena cava is normal in size with greater than 50% respiratory variability, suggesting right atrial pressure of 3 mmHg. Conclusion(s)/Recommendation(s): No intracardiac source of embolism detected on this transthoracic study. A transesophageal echocardiogram is recommended to exclude cardiac source of embolism if clinically indicated. FINDINGS  Left Ventricle: Left ventricular ejection fraction, by estimation, is 60 to 65%. The left ventricle has normal function. The left ventricle has no regional wall motion abnormalities. The left ventricular internal cavity size was normal in size. There is  mild concentric left ventricular hypertrophy. Left ventricular diastolic parameters are consistent with Grade I diastolic dysfunction (impaired relaxation). Right Ventricle: The right ventricular size is normal. No increase in right ventricular wall thickness. Right ventricular systolic function is normal. Left Atrium: Left atrial size was mildly dilated. Right Atrium: Right atrial size was mildly dilated. Pericardium: There is no evidence of pericardial effusion. Mitral Valve: The mitral valve is normal in structure. Trivial mitral valve regurgitation. No evidence of mitral valve stenosis. Tricuspid Valve: The tricuspid valve is normal in structure. Tricuspid valve regurgitation is trivial. No evidence of  tricuspid stenosis. Aortic Valve: The aortic valve is normal in structure. Aortic valve regurgitation is not visualized. No aortic stenosis is present. Aortic valve peak gradient measures 7.4 mmHg. Pulmonic Valve: The pulmonic valve was normal in structure. Pulmonic valve regurgitation is not visualized. No evidence of pulmonic stenosis. Aorta: The aortic root is normal in size and structure. Venous: The inferior vena cava is normal in size with greater than 50% respiratory variability, suggesting right atrial pressure of 3 mmHg. IAS/Shunts: No atrial level shunt detected by color flow Doppler.  LEFT VENTRICLE PLAX 2D LVIDd:         4.11 cm  Diastology LVIDs:         2.85 cm  LV  e' medial:    5.22 cm/s LV PW:         0.92 cm  LV E/e' medial:  19.3 LV IVS:        1.12 cm  LV e' lateral:   5.98 cm/s LVOT diam:     1.80 cm  LV E/e' lateral: 16.9 LV SV:         48 LV SV Index:   28 LVOT Area:     2.54 cm  RIGHT VENTRICLE RV Basal diam:  2.55 cm RV S prime:     12.40 cm/s TAPSE (M-mode): 1.6 cm LEFT ATRIUM             Index       RIGHT ATRIUM          Index LA diam:        3.40 cm 1.95 cm/m  RA Area:     9.04 cm LA Vol (A2C):   20.6 ml 11.84 ml/m RA Volume:   17.40 ml 10.00 ml/m LA Vol (A4C):   25.0 ml 14.36 ml/m LA Biplane Vol: 24.2 ml 13.90 ml/m  AORTIC VALVE                PULMONIC VALVE AV Area (Vmax): 1.56 cm    PV Vmax:       1.07 m/s AV Vmax:        136.00 cm/s PV Peak grad:  4.6 mmHg AV Peak Grad:   7.4 mmHg LVOT Vmax:      83.60 cm/s LVOT Vmean:     56.300 cm/s LVOT VTI:       0.189 m  AORTA Ao Root diam: 2.30 cm Ao Asc diam:  2.50 cm MITRAL VALVE MV Area (PHT): 4.17 cm     SHUNTS MV Decel Time: 182 msec     Systemic VTI:  0.19 m MV E velocity: 101.00 cm/s  Systemic Diam: 1.80 cm MV A velocity: 112.00 cm/s MV E/A ratio:  0.90 Adrian Blackwater MD Electronically signed by Adrian Blackwater MD Signature Date/Time: 02/03/2020/11:32:26 AM    Final       Assessment/Plan 1. TIA;acute/subacute cortical posterior R  frontal lobe and Right parietal lobe infarcts and an additional punctate R parieto-occipital cortical infarct. 2: Symptomatic High grade RIGHT ICA stenosis >70% 3. Cardiology Consult- History of CABG x 3; stents in the last few years; ECHO- EF 60% Bubble study pending 4. Will plan for CEA this week per Dr. Dew/Dr. Anne Shutter, MD  02/04/2020 9:43 AM    This note was created with Dragon medical transcription system.  Any error is purely unintentional

## 2020-02-04 NOTE — Progress Notes (Signed)
OT Cancellation Note  Patient Details Name: Danielle Paul MRN: [id number] DOB: August 19, 1955   Cancelled Treatment:    Reason Eval/Treat Not Completed: OT screened, no needs identified, will sign off. Duplicate orders received. Pt evaluated by OT this admission and signed off as pt is at functional baseline for ADLs and mobility with strong family support. Please see 02/02/20 evaluation for recommendations. Thank you.    Kathie Dike, M.S. OTR/L  02/04/20, 4:40 PM  ascom [phone]

## 2020-02-04 NOTE — Progress Notes (Signed)
PROGRESS NOTE  Maimouna Rondeau FTD:[id number] DOB: 03-25-1955 DOA: 02/02/2020 PCP: [facility]  Brief History   Danielle Paul is a 64 y.o. female with medical history significant for coronary artery disease, hypertension, aortic stenosis status post TAVR, history of sarcoid and diabetes mellitus who presents to the ER for evaluation of left arm weakness and numbness that started around noon the day prior to her admission.  She also had associated tingling in her left arm.  She states that she initially was unable to lift her left arm but that has improved and at the time of this history and physical she is able to move that left arm but continues to complain of tingling.  She denies any left lower extremity weakness or tingling, she denies having any falls, no loss of consciousness, no blurry vision, no headache or difficulty swallowing. She denies having any chest pain, no shortness of breath, no fever, no chills, no nausea, no vomiting, no changes in her bowel habits or any urinary symptoms. Labs show sodium 135, potassium 4.5, chloride 99, bicarb 25, glucose 155, BUN 39, creatinine 2.42, calcium 9.4, alkaline phosphatase 76, albumin 4.1, AST 23, ALT 21, calcium 8.0, white count 6.3, hemoglobin 12.6, hematocrit 38.1, MCV 79.5, RDW 12.6, platelet count 331 Respiratory viral panel is negative Urine analysis shows pyuria with positive nitrites and leukocyte esterase CT scan of the head without contrast shows no acute intracranial pathology. Chest x-ray reviewed by me shows no active disease. Twelve-lead EKG reviewed by me shows normal sinus rhythm with diffuse T wave inversions  Patient is a 64 year old female with a history of diabetes mellitus and hypertension who presents to the emergency room for evaluation of left arm weakness and tingling which started around noon on the day prior to her admission.  Left arm weakness has improved but not resolved completely and tingling persists.   She will be referred to observation status for further evaluation.  The patient was admitted to a telemetry bed. MRI brain demonstrated : 1. Subcentimeter acute cortical/subcortical infarcts within the posterior right frontal lobe and right parietal lobe (involving the pre and postcentral gyri). 2. Additional punctate acute right parietooccipital cortical infarct. 3. Mild cerebral atrophy and chronic small vessel ischemic disease.  MRA Brain demonstrated intracranial atherosclerotic disease with: 1) Findings suggestive of high-grade stenosis or occlusion of prox V4 left vertebral artery 2) Moderate right vertebral artery stenosis at the level of the skull base 3) moderate/severe stenoses within the prox P2 posterior cerebral arteries bilaterally 4) Severe stenoses within the proximal cavernous segments of both intracranial internal carotid arteries with moderate stenosis within the cavernous right ICA more distally. 5) moderate stenosis within an inferior division prox M2 right MCA. 6) 1-2 mm vascular protrusion arising from the pre-cavernous left ICA  Echocardiogram: LV EF 60-65% with normal function. Grade 1 diastolic dysfunction. Nl RV size and function. Mildly dilated LA and RA. No intracardiac source of embolism seen on this study. No Bubble study.  Neurology has been consulted as has vascular surgery. Neurology has recommended an echo with a bubble study this is pending.  Vascular surgery plans for CEA later this week per Dr. Angelica Pou.  Consultants  . Neurology . Vascular surgery  Procedures  . None  Antibiotics   Anti-infectives (From admission, onward)   Start     Dose/Rate Route Frequency Ordered Stop   02/02/20 1045  cefTRIAXone (ROCEPHIN) 1 g in sodium chloride 0.9 % 100 mL IVPB        1  g 200 mL/hr over 30 Minutes Intravenous Every 24 hours 02/02/20 1035        Subjective  The patient is resting quietly. No new complaints.  Objective   Vitals:  Vitals:    02/04/20 0728 02/04/20 1116  BP: (!) 130/59 (!) 145/60  Pulse: 74 72  Resp: 17 18  Temp: 98.5 F (36.9 C) 97.8 F (36.6 C)  SpO2: 99% 100%   Exam:  Constitutional:  . The patient is awake, alert, and oriented x 3. No acute distress. Respiratory:  . No increased work of breathing. . No wheezes, rales, or rhonchi . No tactile fremitus Cardiovascular:  . Regular rate and rhythm . No murmurs, ectopy, or gallups. . No lateral PMI. No thrills. Abdomen:  . Abdomen is soft, non-tender, non-distended . No hernias, masses, or organomegaly . Normoactive bowel sounds.  Musculoskeletal:  . No cyanosis, clubbing, or edema Skin:  . No rashes, lesions, ulcers . palpation of skin: no induration or nodules Neurologic:  . CN 2-12 intact . Sensation all 4 extremities intact Psychiatric:  . Mental status o Mood, affect appropriate o Orientation to person, place, time  . judgment and insight appear intact  I have personally reviewed the following:   Today's Data  . Vitals, lipid panel  Imaging  . CT head . MRI brain . MRA brain  Cardiology Data  . Echocardiogram: EF 60-65% with Grade 1 Diastolic dysfunction. Right and left atrium are dilated. No intracardiac source of emboli is seen. . Bubble study is pending.  Scheduled Meds: . aspirin  300 mg Rectal Daily   Or  . aspirin EC  325 mg Oral Daily  . clopidogrel  75 mg Oral Daily  . FLUoxetine  10 mg Oral Daily  . insulin aspart  0-15 Units Subcutaneous TID WC  . rosuvastatin  40 mg Oral Daily  . sodium chloride flush  3 mL Intravenous Once   Continuous Infusions: . sodium chloride    . sodium chloride 100 mL/hr at 02/04/20 0221  . cefTRIAXone (ROCEPHIN)  IV 1 g (02/04/20 1033)    Principal Problem:   TIA (transient ischemic attack) Active Problems:   Diabetes mellitus without complication (HCC)   Hypertension   AKI (acute kidney injury) (HCC)   Acute lower UTI   CVA (cerebral vascular accident) (HCC)   LOS: 1 day    A & P  Acute CVA to right frontal lobe and acute punctate infarct to the parieto-occipital cortex.  Patient presented for evaluation of left arm weakness and tingling. MRA demonstrates significant stenosis to Right ICA. Vascular surgery is consulted and CTA neck has been ordered as per their request. She is receiving aspirin and high intensity statin. Permissive hypertension is being observed. Echocardiogram has been completed. It demonstrates: EF 60-65% with Grade 1 Diastolic dysfunction. Right and left atrium are dilated. No intracardiac source of emboli is seen. The patient has been cleared for a diet by speech. PT/OT eval has been ordered. Bubble study is pending.  Right ICA stenosis greter than70%: I appreciate vascular surgery's assistance. Plan is for CEA this week.  Diabetes mellitus II: HbA1c is 9.4. Her glucoses are controlled with FSBS and SSI. She is on a modified carbohydrate diet with hypoglycemic protocol. Glucoses have been from 105-135 in the last 24 hours.  Hypertension: Hold all antihypertensive medications. Permissive hypertension observed in the setting of acute CVA.  Urinary tract infection: Patient with significant pyuria. Asymptomatic. Treat empirically with Rocephin until urine culture results become available.  Acute kidney injury: Continue IV fluids. Monitor electrolytes, creatinine, and volume status. Avoid nephrotoxins and hypotension.   Depression: Continue fluoxetine  I have seen and examined this patient myself. I have spent 32 minutes in his evaluation and care.  DVT prophylaxis: SCD Code Status: Full code Family Communication: None available. Disposition Plan:  Status is: Inpatient  Remains inpatient appropriate because:Inpatient level of care appropriate due to severity of illness   Dispo: The patient is from: Home              Anticipated d/c is to: Home              Anticipated d/c date is: > 3 days              Patient currently is not  medically stable to d/c.  Tyshana Nishida, DO [facility] Direct contact: see [email or web address]  7PM-7AM contact night coverage as above 02/04/2020, 12:37 AM  LOS: 0 days

## 2020-02-05 ENCOUNTER — Inpatient Hospital Stay
Admit: 2020-02-05 | Discharge: 2020-02-05 | Disposition: A | Discharge status: Home / Self Care | Payer: BLUE CROSS/BLUE SHIELD | Attending: Neurology | Admitting: Neurology

## 2020-02-05 DIAGNOSIS — I779 Disorder of arteries and arterioles, unspecified: Secondary | ICD-10-CM

## 2020-02-05 DIAGNOSIS — R531 Weakness: Secondary | ICD-10-CM

## 2020-02-05 DIAGNOSIS — E860 Dehydration: Secondary | ICD-10-CM

## 2020-02-05 LAB — BASIC METABOLIC PANEL
Anion gap: 8 (ref 5–15)
BUN: 41 mg/dL — ABNORMAL HIGH (ref 8–23)
CO2: 20 mmol/L — ABNORMAL LOW (ref 22–32)
Calcium: 8.5 mg/dL — ABNORMAL LOW (ref 8.9–10.3)
Chloride: 106 mmol/L (ref 98–111)
Creatinine, Ser: 1.71 mg/dL — ABNORMAL HIGH (ref 0.44–1.00)
GFR, Estimated: 33 mL/min — ABNORMAL LOW (ref >60)
Glucose, Bld: 113 mg/dL — ABNORMAL HIGH (ref 70–99)
Potassium: 4.2 mmol/L (ref 3.5–5.1)
Sodium: 134 mmol/L — ABNORMAL LOW (ref 135–145)

## 2020-02-05 LAB — GLUCOSE, CAPILLARY
Glucose-Capillary: 111 mg/dL — ABNORMAL HIGH (ref 70–99)
Glucose-Capillary: 109 mg/dL — ABNORMAL HIGH (ref 70–99)
Glucose-Capillary: 177 mg/dL — ABNORMAL HIGH (ref 70–99)
Glucose-Capillary: 134 mg/dL — ABNORMAL HIGH (ref 70–99)

## 2020-02-05 LAB — URINE CULTURE: Culture: NO GROWTH

## 2020-02-05 NOTE — Progress Notes (Signed)
Nutrition Brief Note  Patient identified on the Malnutrition Screening Tool (MST) Report  Wt Readings from Last 15 Encounters:  02/01/20 74.8 kg   Danielle Paul is a 64 y.o. female with medical history significant for coronary artery disease, hypertension, aortic stenosis status post TAVR, history of sarcoid and diabetes mellitus who presents to the ER for evaluation of left arm weakness and numbness that started around noon the day prior to her admission.  Pt admitted with TIA and rule out for acute stroke.   Reviewed I/O's: + x 24 hours and +943 ml since admission  Attempted to speak with pt via call to hospital room phone, however, unable to reach.   Per SLP, pt with no swallowing difficulties with current diet textures.   Reviewed wt hx. Per CareEverywhere, pt was 156# 12/10/17.   Medications reviewed and include miralax.   Lab Results  Component Value Date   HGBA1C 9.4 (H) 02/03/2020  PTA DM medications are 3-9 units insulin lispro TID with meals and 30 minutes lantus solostar daily.   Labs reviewed:Na: 134, CBGS: 109-111 (inpatient orders for glycemic control are 0-15 units insulin aspart TID with meals).   Body mass index is 31.18 kg/m. Patient meets criteria for obesity, class I based on current BMI.   Current diet order is carb modified, patient is consuming approximately 100% of meals at this time. Labs and medications reviewed.   No nutrition interventions warranted at this time. If nutrition issues arise, please consult RD.   Levada Schilling, RD, LDN, CDCES Registered Dietitian II Certified Diabetes Care and Education Specialist Please refer to [facility] for RD and/or RD on-call/weekend/after hours pager

## 2020-02-05 NOTE — Progress Notes (Signed)
PT Cancellation Note  Patient Details Name: Danielle Paul MRN: [id number] DOB: 10-15-1955   Cancelled Treatment:    Reason Eval/Treat Not Completed: PT screened, no needs identified, will sign off.  Pt recently evaluated by physical therapy during this hospital admission (02/02/20) and pt was ambulatory 725 feet (no AD) and modified independent ambulating 12 steps with B railings; physical therapy signed off.  New PT consult received afternoon 02/04/20.  Therapist attempted to contact attending MD via secure message this morning (to see if pt had a change in status indicating need for new PT consult) but no response received yet.  D/t this, therapist stopped in pt's room to talk with pt: pt reports being independent with her walking and has been walking to the bathroom by herself; pt reports no change in status from initial PT evaluation (12/12) and pt reports no physical therapy needs.  PT screen performed; no needs identified; will sign off.  Please re-consult PT if pt's status changes and acute PT needs identified.  Irving Burton Jaysiah Marchetta 02/05/2020, 3:45 PM

## 2020-02-05 NOTE — Progress Notes (Signed)
[facility] Daily Progress Note   Subjective: Patient with DM, HTN, HLD, history of CABG x3 and subsequent stenting presents with LEFT upper extremity weakness, numbness and tingling of fingertips that initially began 4 days prior to presentation. The symptoms resolved immediately but reoccurred 2 days later with Left sided facial droop. The upper extremity symptoms resolved, with residual mild left sided facial droop. She denied lower extremity weakness, slurred speech, chest pain, palpitations or other TIA or stroke like symptoms. Denies similar symptoms in the past.  No issues overnight.  Objective: Vitals:   02/04/20 2336 02/05/20 0407 02/05/20 0729 02/05/20 1135  BP: (!) 124/51 (!) 144/60 (!) 147/56 (!) 156/67  Pulse: 70 70 66 71  Resp: 18 18 14 16   Temp: 98.5 F (36.9 C) 98.3 F (36.8 C) 98 F (36.7 C) 97.8 F (36.6 C)  TempSrc:  Oral Oral Oral  SpO2: 98% 100% 96% 98%  Weight:      Height:        Intake/Output Summary (Last 24 hours) at 02/05/2020 1216 Last data filed at 02/05/2020 1034 Gross per 24 hour  Intake 596 ml  Output --  Net 596 ml   Physical Exam: A&Ox3, NAD CV: RRR Pulmonary: CTA Bilaterally Abdomen: Soft, Nontender, Nondistended Vascular: Warm bilaterally to toes Neuro: No deficits noted on exam   Laboratory: CBC    Component Value Date/Time   WBC 5.8 02/04/2020 0524   HGB 11.1 (L) 02/04/2020 0524   HCT 34.1 (L) 02/04/2020 0524   PLT 289 02/04/2020 0524   BMET    Component Value Date/Time   NA 134 (L) 02/05/2020 0421   K 4.2 02/05/2020 0421   CL 106 02/05/2020 0421   CO2 20 (L) 02/05/2020 0421   GLUCOSE 113 (H) 02/05/2020 0421   BUN 41 (H) 02/05/2020 0421   CREATININE 1.71 (H) 02/05/2020 0421   CALCIUM 8.5 (L) 02/05/2020 0421   GFRNONAA 33 (L) 02/05/2020 0421   Assessment/Planning: The patient is a 64 year old female with multiple medical issues stenosis in setting of CVA  1) agree with initiation of aspirin and  Plavix 2) due to the patient's renal function was unable to obtain CTA neck.  MRA notable for possible high-grade stenosis at the right ICA mild.  Unfortunately, this is not the most accurate test and assessing the patient's anatomy / degree of atherosclerotic disease.  Recommend undergoing a carotid angiogram and ascertain whether open versus stent repair is optimal.   3) we will plan on this in approximately two weeks. in the setting of an acute stroke unable to intervene open or endoscopically due to the risk of new/worsening CVA.  Imaging reviewed with Dr. 77 Case discussed with Dr. Wyn Quaker Kambryn Dapolito PA-C 02/05/2020 12:16 PM

## 2020-02-05 NOTE — Progress Notes (Signed)
PROGRESS NOTE  Danielle Paul DPO:[id number] DOB: 05/11/1955 DOA: 02/02/2020 P[facility]: [facility]  Brief History   Danielle Paul is a 64 y.o. female with medical history significant for coronary artery disease, hypertension, aortic stenosis status post TAVR, history of sarcoid and diabetes mellitus who presents to the ER for evaluation of left arm weakness and numbness that started around noon the day prior to her admission.  She also had associated tingling in her left arm.  She states that she initially was unable to lift her left arm but that has improved and at the time of this history and physical she is able to move that left arm but continues to complain of tingling.  She denies any left lower extremity weakness or tingling, she denies having any falls, no loss of consciousness, no blurry vision, no headache or difficulty swallowing. She denies having any chest pain, no shortness of breath, no fever, no chills, no nausea, no vomiting, no changes in her bowel habits or any urinary symptoms. Labs show sodium 135, potassium 4.5, chloride 99, bicarb 25, glucose 155, BUN 39, creatinine 2.42, calcium 9.4, alkaline phosphatase 76, albumin 4.1, AST 23, ALT 21, calcium 8.0, white count 6.3, hemoglobin 12.6, hematocrit 38.1, MCV 79.5, RDW 12.6, platelet count 331 Respiratory viral panel is negative Urine analysis shows pyuria with positive nitrites and leukocyte esterase CT scan of the head without contrast shows no acute intracranial pathology. Chest x-ray reviewed by me shows no active disease. Twelve-lead EKG reviewed by me shows normal sinus rhythm with diffuse T wave inversions  Patient is a 64 year old female with a history of diabetes mellitus and hypertension who presents to the emergency room for evaluation of left arm weakness and tingling which started around noon on the day prior to her admission.  Left arm weakness has improved but not resolved completely and tingling persists.   She will be referred to observation status for further evaluation.  The patient was admitted to a telemetry bed. MRI brain demonstrated : 1. Subcentimeter acute cortical/subcortical infarcts within the posterior right frontal lobe and right parietal lobe (involving the pre and postcentral gyri). 2. Additional punctate acute right parietooccipital cortical infarct. 3. Mild cerebral atrophy and chronic small vessel ischemic disease.  MRA Brain demonstrated intracranial atherosclerotic disease with: 1) Findings suggestive of high-grade stenosis or occlusion of prox V4 left vertebral artery 2) Moderate right vertebral artery stenosis at the level of the skull base 3) moderate/severe stenoses within the prox P2 posterior cerebral arteries bilaterally 4) Severe stenoses within the proximal cavernous segments of both intracranial internal carotid arteries with moderate stenosis within the cavernous right ICA more distally. 5) moderate stenosis within an inferior division prox M2 right MCA. 6) 1-2 mm vascular protrusion arising from the pre-cavernous left ICA  Echocardiogram: LV EF 60-65% with normal function. Grade 1 diastolic dysfunction. Nl RV size and function. Mildly dilated LA and RA. No intracardiac source of embolism seen on this study. No Bubble study.  Neurology has been consulted as has vascular surgery. Neurology has recommended an echo with a bubble study this is pending.  Vascular surgery PA has now stated that pt will need to undergo CTA of neck in 2 weeks and that CEA cannot be done before that due to increased risk in the setting of acute stroke. Bubble study is pending.  plans for CEA later this week per Dr. Angelica Pou.  Consultants  . Neurology . Vascular surgery  Procedures  . None  Antibiotics   Anti-infectives (From  admission, onward)   Start     Dose/Rate Route Frequency Ordered Stop   02/02/20 1045  cefTRIAXone (ROCEPHIN) 1 g in sodium chloride 0.9 % 100 mL IVPB         1 g 200 mL/hr over 30 Minutes Intravenous Every 24 hours 02/02/20 1035        Subjective  The patient is resting quietly. No new complaints.  Objective   Vitals:  Vitals:   02/05/20 0729 02/05/20 1135  BP: (!) 147/56 (!) 156/67  Pulse: 66 71  Resp: 14 16  Temp: 98 F (36.7 C) 97.8 F (36.6 C)  SpO2: 96% 98%   Exam:  Constitutional:  . The patient is awake, alert, and oriented x 3. No acute distress. Respiratory:  . No increased work of breathing. . No wheezes, rales, or rhonchi . No tactile fremitus Cardiovascular:  . Regular rate and rhythm . No murmurs, ectopy, or gallups. . No lateral PMI. No thrills. Abdomen:  . Abdomen is soft, non-tender, non-distended . No hernias, masses, or organomegaly . Normoactive bowel sounds.  Musculoskeletal:  . No cyanosis, clubbing, or edema Skin:  . No rashes, lesions, ulcers . palpation of skin: no induration or nodules Neurologic:  . CN 2-12 intact . Sensation all 4 extremities intact Psychiatric:  . Mental status o Mood, affect appropriate o Orientation to person, place, time  . judgment and insight appear intact  I have personally reviewed the following:   Today's Data  . Vitals, lipid panel  Imaging  . CT head . MRI brain . MRA brain  Cardiology Data  . Echocardiogram: EF 60-65% with Grade 1 Diastolic dysfunction. Right and left atrium are dilated. No intracardiac source of emboli is seen. . Bubble study is pending.  Scheduled Meds: . aspirin  300 mg Rectal Daily   Or  . aspirin EC  325 mg Oral Daily  . clopidogrel  75 mg Oral Daily  . FLUoxetine  10 mg Oral Daily  . insulin aspart  0-15 Units Subcutaneous TID WC  . polyethylene glycol  17 g Oral Daily  . rosuvastatin  40 mg Oral Daily  . sodium chloride flush  3 mL Intravenous Once   Continuous Infusions: . sodium chloride    . sodium chloride 100 mL/hr at 02/04/20 1619  . cefTRIAXone (ROCEPHIN)  IV 1 g (02/05/20 0936)    Principal  Problem:   TIA (transient ischemic attack) Active Problems:   Diabetes mellitus without complication (HCC)   Hypertension   AKI (acute kidney injury) (HCC)   Acute lower UTI   CVA (cerebral vascular accident) (HCC)   LOS: 2 days   A & P  Acute CVA to right frontal lobe and acute punctate infarct to the parieto-occipital cortex.  Patient presented for evaluation of left arm weakness and tingling. MRA demonstrates significant stenosis to Right ICA. Vascular surgery is consulted and CTA neck has been ordered as per their request. She is receiving aspirin and high intensity statin. Permissive hypertension is being observed. Echocardiogram has been completed. It demonstrates: EF 60-65% with Grade 1 Diastolic dysfunction. Right and left atrium are dilated. No intracardiac source of emboli is seen. The patient has been cleared for a diet by speech. PT/OT eval has been ordered. Bubble study is pending.  Right ICA stenosis greter than70%: I appreciate vascular surgery's assistance. Plan is for CTA of neck in 2 weeks with CEA there after. Vascular surgery PA today states that CEA in setting of acute CVA is too  high to do sooner.  Diabetes mellitus II: HbA1c is 9.4. Her glucoses are controlled with FSBS and SSI. She is on a modified carbohydrate diet with hypoglycemic protocol. Glucoses have been from 105-135 in the last 24 hours.  Hypertension: Hold all antihypertensive medications. Permissive hypertension observed in the setting of acute CVA.  Urinary tract infection: Patient with significant pyuria. Asymptomatic. Treat empirically with Rocephin until urine culture results become available.  Acute kidney injury: Continue IV fluids. Monitor electrolytes, creatinine, and volume status. Avoid nephrotoxins and hypotension.   Depression: Continue fluoxetine  I have seen and examined this patient myself. I have spent 34 minutes in his evaluation and care.  DVT prophylaxis: SCD Code Status: Full  code Family Communication: None available. Disposition Plan:  Status is: Inpatient  Remains inpatient appropriate because:Inpatient level of care appropriate due to severity of illness   Dispo: The patient is from: Home              Anticipated d/c is to: Home              Anticipated d/c date is: > 3 days              Patient currently is not medically stable to d/c.  Danielle Canavan, DO [facility] Direct contact: see [email or web address]  7PM-7AM contact night coverage as above 02/05/2020, 1:23 AM  LOS: 0 days

## 2020-02-05 NOTE — Progress Notes (Signed)
*  PRELIMINARY RESULTS* Echocardiogram 2D Echocardiogram has been performed.  Joanette Gula Kineta Fudala 02/05/2020, 9:04 AM

## 2020-02-06 LAB — BASIC METABOLIC PANEL
Anion gap: 7 (ref 5–15)
BUN: 38 mg/dL — ABNORMAL HIGH (ref 8–23)
CO2: 22 mmol/L (ref 22–32)
Calcium: 8.9 mg/dL (ref 8.9–10.3)
Chloride: 105 mmol/L (ref 98–111)
Creatinine, Ser: 1.56 mg/dL — ABNORMAL HIGH (ref 0.44–1.00)
GFR, Estimated: 37 mL/min — ABNORMAL LOW (ref >60)
Glucose, Bld: 107 mg/dL — ABNORMAL HIGH (ref 70–99)
Potassium: 4.7 mmol/L (ref 3.5–5.1)
Sodium: 134 mmol/L — ABNORMAL LOW (ref 135–145)

## 2020-02-06 LAB — GLUCOSE, CAPILLARY
Glucose-Capillary: 118 mg/dL — ABNORMAL HIGH (ref 70–99)
Glucose-Capillary: 119 mg/dL — ABNORMAL HIGH (ref 70–99)
Glucose-Capillary: 129 mg/dL — ABNORMAL HIGH (ref 70–99)

## 2020-02-06 MED ORDER — POLYETHYLENE GLYCOL 3350 17 G PO PACK: 17.0000 g | PACK | Freq: Every day | ORAL | 0 refills | Status: DC

## 2020-02-06 MED ORDER — CLOPIDOGREL BISULFATE 75 MG PO TABS: 75.0000 mg | ORAL_TABLET | Freq: Every day | ORAL | 0 refills | Status: DC

## 2020-02-06 NOTE — Progress Notes (Signed)
Pt being discharged home, discharge instructions reviewed with pt, states understanding, pt with no complaints 

## 2020-02-11 NOTE — Discharge Summary (Signed)
Physician Discharge Summary  Danielle Paul ZOX:[id number] DOB: 08-Feb-1956 DOA: 02/02/2020  PCP: [facility]  Admit date: 02/02/2020 Discharge date: 02/11/2020  Recommendations for Outpatient Follow-up:  1. Discharge to home. 2. No driving until cleared by neurology. 3. Follow up with PCP in 7-10 days. 4. [facility] up [facility] 4 [facility]. 5. CTA neck as outpatient as arranged for by Vascular surgery. 6. Follow up with vascular surgery in 2 weeks. 7. Take both aspirin and Plavix for 3 weeks followed by aspirin along.   Follow-up Information    Dew, Marlow Baars, MD In 1 week.   Specialties: Vascular Surgery, Radiology, Interventional Cardiology Why: Patient to call and make own follow up appt     Can see Dew or Vivia Birmingham.  Discuss need for carotid angiogram. Contact information: [address] [place] [address] [phone]        Micki Riley, MD. Schedule an appointment as soon as possible for a visit in 4 weeks.   Specialties: Neurology, Radiology Why: Patient to make own follow up appt Contact information: [address] [place] [address] [phone]        [facility] In 1 week.   Why: patient to make own follow up appt Contact information: [address] [place] [address] [phone]                Discharge Diagnoses: Principal diagnosis is #1 1. Acute/subacute CVA of the right Fontal lobe and Right parietal lobe and an additional punctate right parieto-occipital cortical infarct. 2. Symptomatic Right ICA stenosis of greter than 70% 3. DM II  With hypertensive heart disease 4. AKI 5. Depression  Discharge Condition: fair Disposition: Home  Diet recommendation: Heart healthy/Modified carbohydrates  Filed Weights   02/01/20 2332  Weight: 74.8 kg    History of present illness: Danielle Paul is a 64 y.o. female with medical history significant for coronary artery disease,  hypertension, aortic stenosis status post TAVR, history of sarcoid and diabetes mellitus who presents to the ER for evaluation of left arm weakness and numbness that started around noon the day prior to her admission.  She also had associated tingling in her left arm.  She states that she initially was unable to lift her left arm but that has improved and at the time of this history and physical she is able to move that left arm but continues to complain of tingling.  She denies any left lower extremity weakness or tingling, she denies having any falls, no Paul of consciousness, no blurry vision, no headache or difficulty swallowing. She denies having any chest pain, no shortness of breath, no fever, no chills, no nausea, no vomiting, no changes in her bowel habits or any urinary symptoms. Labs show sodium 135, potassium 4.5, chloride 99, bicarb 25, glucose 155, BUN 39, creatinine 2.42, calcium 9.4, alkaline phosphatase 76, albumin 4.1, AST 23, ALT 21, calcium 8.0, white count 6.3, hemoglobin 12.6, hematocrit 38.1, MCV 79.5, RDW 12.6, platelet count 331 Respiratory viral panel is negative Urine analysis shows pyuria with positive nitrites and leukocyte esterase CT scan of the head without contrast shows no acute intracranial pathology. Chest x-ray reviewed by me shows no active disease. Twelve-lead EKG reviewed by me shows normal sinus rhythm with diffuse T wave inversions  Patient is a 64 year old female with a history of diabetes mellitus and hypertension who presents to the emergency room for evaluation of left arm weakness and tingling which started  around noon on the day prior to her admission.  Left arm weakness has improved but not resolved completely and tingling persists.  She will be referred to observation status for further evaluation.  Hospital Course: Patient is a 64 year old female with a history of diabetes mellitus and hypertension who presents to the emergency room for evaluation of  left arm weaknessand tinglingwhich started around noon on the day prior to her admission.Left arm weakness has improved but not resolved completely and tingling persists. She will be referred to observation status for further evaluation.  The patient was admitted to a telemetry bed. MRI brain demonstrated : 1. Subcentimeter acute cortical/subcortical infarcts within the posterior right frontal lobe and right parietal lobe (involving the pre and postcentral gyri). 2. Additional punctate acute right parietooccipital cortical infarct. 3. Mild cerebral atrophy and chronic small vessel ischemic disease.  MRA Brain demonstrated intracranial atherosclerotic disease with: 1) Findings suggestive of high-grade stenosis or occlusion of prox V4 left vertebral artery 2) Moderate right vertebral artery stenosis at the level of the skull base 3) moderate/severe stenoses within the prox P2 posterior cerebral arteries bilaterally 4) Severe stenoses within the proximal cavernous segments of both intracranial internal carotid arteries with moderate stenosis within the cavernous right ICA more distally. 5) moderate stenosis within an inferior division prox M2 right MCA. 6) 1-2 mm vascular protrusion arising from the pre-cavernous left ICA  Echocardiogram: LV EF 60-65% with normal function. Grade 1 diastolic dysfunction. Nl RV size and function. Mildly dilated LA and RA. No intracardiac source of embolism seen on this study. No Bubble study.  Neurology has been consulted as has vascular surgery. Neurology has recommended an echo with a bubble study this is pending.  Vascular surgery PA has now stated that pt will need to undergo CTA of neck in 2 weeks and that CEA cannot be done before that due to increased risk in the setting of acute stroke.   plans for CEA later this week per Dr. Angelica Pouew/Schnier. Bubble study is negative for shunt.  The patient has been cleared for discharge to home by neurology and  vascular surgery. She will follow up with vascular surgery and neurology as outpatient.  Today's assessment: S: The patient is resting comfortably. No new complaints. O: Vitals:  Vitals:   02/06/20 0729 02/06/20 1115  BP: (!) 145/60 (!) 161/77  Pulse: 63 72  Resp: 14 16  Temp: 97.8 F (36.6 C) 98 F (36.7 C)  SpO2: 98% 100%   Exam:  Constitutional:   The patient is awake, alert, and oriented x 3. No acute distress. Respiratory:   No increased work of breathing.  No wheezes, rales, or rhonchi  No tactile fremitus Cardiovascular:   Regular rate and rhythm  No murmurs, ectopy, or gallups.  No lateral PMI. No thrills. Abdomen:   Abdomen is soft, non-tender, non-distended  No hernias, masses, or organomegaly  Normoactive bowel sounds.  Musculoskeletal:   No cyanosis, clubbing, or edema Skin:   No rashes, lesions, ulcers  palpation of skin: no induration or nodules Neurologic:   CN 2-12 intact  Sensation all 4 extremities intact Psychiatric:   Mental status ? Mood, affect appropriate ? Orientation to person, place, time   judgment and insight appear intact  Discharge Instructions  Discharge Instructions    Call MD for:   Complete by: As directed    Neurologic changes.   Diet - low sodium heart healthy   Complete by: As directed    Diet Carb Modified  Complete by: As directed    Discharge instructions   Complete by: As directed    Discharge to home. No driving until cleared by neurology. Follow up with PCP in 7-10 days. [facility]. CTA neck as outpatient as arranged for by Vascular surgery. Follow up with vascular surgery in 2 weeks. Take both aspirin and Plavix for 3 weeks followed by aspirin along.   Increase activity slowly   Complete by: As directed      Allergies as of 02/06/2020      Reactions   Atorvastatin Other (See Comments)   Muscle Pains   Isosorbide Nitrate Other (See Comments)    Jittery and lightheadedness.   Pravastatin Other (See Comments)   Simvastatin Other (See Comments)   Muscle Pains      Medication List    STOP taking these medications   amLODipine 10 MG tablet Commonly known as: NORVASC   hydrochlorothiazide 25 MG tablet Commonly known as: HYDRODIURIL   spironolactone 25 MG tablet Commonly known as: ALDACTONE     TAKE these medications   aspirin 81 MG EC tablet Take 81 mg by mouth daily.   carvedilol 25 MG tablet Commonly known as: COREG Take 25 mg by mouth 2 (two) times daily.   clopidogrel 75 MG tablet Commonly known as: PLAVIX Take 1 tablet (75 mg total) by mouth daily.   FLUoxetine 10 MG tablet Commonly known as: PROZAC Take 10 mg by mouth daily.   insulin lispro 100 UNIT/ML injection Commonly known as: HUMALOG Inject 3-9 Units into the skin 3 (three) times daily before meals. (based upon blood glucose reading)   Lantus SoloStar 100 UNIT/ML Solostar Pen Generic drug: insulin glargine Inject 30 Units into the skin at bedtime.   polyethylene glycol 17 g packet Commonly known as: MIRALAX / GLYCOLAX Take 17 g by mouth daily.   rosuvastatin 40 MG tablet Commonly known as: CRESTOR Take 40 mg by mouth daily.      Allergies  Allergen Reactions  . Atorvastatin Other (See Comments)    Muscle Pains  . Isosorbide Nitrate Other (See Comments)    Jittery and lightheadedness.  . Pravastatin Other (See Comments)  . Simvastatin Other (See Comments)    Muscle Pains    The results of significant diagnostics from this hospitalization (including imaging, microbiology, ancillary and laboratory) are listed below for reference.    Significant Diagnostic Studies: DG Chest 1 View  Result Date: 02/02/2020 CLINICAL DATA:  Weakness EXAM: CHEST  1 VIEW COMPARISON:  None. FINDINGS: The heart size and mediastinal contours are within normal limits. Aortic knob calcifications are seen. Overlying median sternotomy wires are present. Both  lungs are clear. The visualized skeletal structures are unremarkable. IMPRESSION: No active disease. Electronically Signed   By: Jonna Clark M.D.   On: 02/02/2020 00:19   CT HEAD WO CONTRAST  Result Date: 02/02/2020 CLINICAL DATA:  Weakness and left arm tingling. EXAM: CT HEAD WITHOUT CONTRAST TECHNIQUE: Contiguous axial images were obtained from the base of the skull through the vertex without intravenous contrast. COMPARISON:  None. FINDINGS: Brain: No evidence of acute infarction, hemorrhage, hydrocephalus, extra-axial collection or mass lesion/mass effect. Vascular: No hyperdense vessel or unexpected calcification. Skull: Normal. Negative for fracture or focal lesion. Sinuses/Orbits: No acute finding. Other: None. IMPRESSION: No acute intracranial pathology. Electronically Signed   By: Aram Candela M.D.   On: 02/02/2020 00:30   MR ANGIO HEAD WO CONTRAST  Result Date: 02/02/2020  CLINICAL DATA:  Neuro deficit, acute, stroke suspected. Additional history provided: Left arm tingling with weakness since yesterday morning. EXAM: MRI HEAD WITHOUT CONTRAST MRA HEAD WITHOUT CONTRAST TECHNIQUE: Multiplanar, multiecho pulse sequences of the brain and surrounding structures were obtained without intravenous contrast. Angiographic images of the head were obtained using MRA technique without contrast. COMPARISON:  Head CT 02/02/2020. FINDINGS: MRI HEAD FINDINGS Brain: Mild cerebral and cerebellar atrophy. Subcentimeter acute cortical/subcortical infarcts within the posterior right frontal lobe and right parietal lobe (involving the pre and postcentral gyri). Additional punctate acute cortical infarct more inferiorly and laterally within the right parietooccipital lobes. Mild multifocal T2/FLAIR hyperintensity within the cerebral white matter is nonspecific, but compatible with chronic small vessel ischemic disease. No evidence of intracranial mass. No chronic intracranial blood products. No extra-axial fluid  collection. No midline shift. Partially empty sella turcica. Vascular: Reported below. Skull and upper cervical spine: No focal marrow lesion. Sinuses/Orbits: Visualized orbits show no acute finding. Prior lens replacement on the left. No significant paranasal sinus disease. MRA HEAD FINDINGS The intracranial internal carotid arteries are patent. Apparent severe stenoses within the proximal cavernous segments bilaterally (series 1067, images 5 and 15). Stenoses of the cavernous ICAs more distally (moderate right, mild left). The M1 middle cerebral arteries are patent. No M2 proximal branch occlusion is identified. Moderate stenosis within an inferior division proximal M2 right MCA branch vessel (series 1067, image 13). The anterior cerebral arteries are patent. 1-2 mm vascular protrusion arising from the pre-cavernous left ICA, which may reflect atherosclerotic lobulation or a small aneurysm (series 13, image 64) (series 1061, image 14). Apparent moderate stenosis within the right vertebral artery at the level of the skull base. The proximal V4 left vertebral artery is poorly delineated, suggestive of high-grade stenosis or vessel occlusion at this site. However, flow related signal is present within the left PICA. The basilar artery is patent. The posterior cerebral arteries are patent. Moderate/severe stenoses within the proximal P2 posterior cerebral arteries bilaterally. IMPRESSION: MRI brain: 1. Subcentimeter acute cortical/subcortical infarcts within the posterior right frontal lobe and right parietal lobe (involving the pre and postcentral gyri). 2. Additional punctate acute right parietooccipital cortical infarct. 3. Mild cerebral atrophy and chronic small vessel ischemic disease. MRA head: 1. Intracranial atherosclerotic disease with findings most notably as follows. 2. The proximal V4 left vertebral artery is poorly delineated, suggestive of high-grade stenosis or occlusion. 3. Moderate right vertebral  artery stenosis at the level of the skull base. 4. Moderate/severe stenoses within the proximal P2 posterior cerebral arteries bilaterally. 5. Apparent severe stenoses within the proximal cavernous segments of both intracranial internal carotid arteries. Moderate stenosis within the cavernous right ICA more distally. 6. Moderate stenosis within an inferior division proximal M2 right MCA vessel. 7. 1-2 mm vascular protrusion arising from the pre-avernous left ICA which may reflect atherosclerotic lobulation or a small aneurysm. Electronically Signed   By: Jackey Loge DO   On: 02/02/2020 12:14   MR ANGIO NECK WO CONTRAST  Result Date: 02/03/2020 CLINICAL DATA:  64 year old female with left side neurologic deficits. Brain MRI yesterday revealing several punctate acute infarcts in the right hemisphere. Intracranial MRA with multifocal high-grade intracranial stenoses. EXAM: MRA NECK WITHOUT CONTRAST TECHNIQUE: Angiographic images of the neck were obtained using MRA technique without intravenous contrast. Carotid stenosis measurements (when applicable) are obtained utilizing NASCET criteria, using the distal internal carotid diameter as the denominator. COMPARISON:  None. FINDINGS: Time-of-flight neck MRA imaging. Antegrade flow in the bilateral cervical carotid arteries to  the skull base. Antegrade flow in the right vertebral artery which has a dominant appearance. Only faint antegrade flow signal is evident in the cervical left vertebral artery, which appears irregular and highly attenuated at the C1 cervical level. No stenosis in the visible dominant right vertebral artery. Partially retropharyngeal course of the right carotid. The right ICA origin is patent, but there is radiographic string sign stenosis at the right ICA bulb (series 10, image 82) with highly attenuated appearance of the vessel along a segment of up to the 7 mm. The cervical right ICA remains patent. At the left carotid bifurcation there is no  significant left ICA stenosis. IMPRESSION: 1. Positive for High-grade stenosis at the Right ICA bulb, RADIOGRAPHIC STRING SIGN. 2. Poor flow in a non-dominant Left Vertebral Artery, which might be functionally occluded at the C1 level. 3. No evidence of cervical Left ICA or Right Vertebral Artery stenosis. Electronically Signed   By: Odessa Fleming M.D.   On: 02/03/2020 21:30   MR Brain Wo Contrast (neuro protocol)  Result Date: 02/02/2020 CLINICAL DATA:  Neuro deficit, acute, stroke suspected. Additional history provided: Left arm tingling with weakness since yesterday morning. EXAM: MRI HEAD WITHOUT CONTRAST MRA HEAD WITHOUT CONTRAST TECHNIQUE: Multiplanar, multiecho pulse sequences of the brain and surrounding structures were obtained without intravenous contrast. Angiographic images of the head were obtained using MRA technique without contrast. COMPARISON:  Head CT 02/02/2020. FINDINGS: MRI HEAD FINDINGS Brain: Mild cerebral and cerebellar atrophy. Subcentimeter acute cortical/subcortical infarcts within the posterior right frontal lobe and right parietal lobe (involving the pre and postcentral gyri). Additional punctate acute cortical infarct more inferiorly and laterally within the right parietooccipital lobes. Mild multifocal T2/FLAIR hyperintensity within the cerebral white matter is nonspecific, but compatible with chronic small vessel ischemic disease. No evidence of intracranial mass. No chronic intracranial blood products. No extra-axial fluid collection. No midline shift. Partially empty sella turcica. Vascular: Reported below. Skull and upper cervical spine: No focal marrow lesion. Sinuses/Orbits: Visualized orbits show no acute finding. Prior lens replacement on the left. No significant paranasal sinus disease. MRA HEAD FINDINGS The intracranial internal carotid arteries are patent. Apparent severe stenoses within the proximal cavernous segments bilaterally (series 1067, images 5 and 15). Stenoses of  the cavernous ICAs more distally (moderate right, mild left). The M1 middle cerebral arteries are patent. No M2 proximal branch occlusion is identified. Moderate stenosis within an inferior division proximal M2 right MCA branch vessel (series 1067, image 13). The anterior cerebral arteries are patent. 1-2 mm vascular protrusion arising from the pre-cavernous left ICA, which may reflect atherosclerotic lobulation or a small aneurysm (series 13, image 64) (series 1061, image 14). Apparent moderate stenosis within the right vertebral artery at the level of the skull base. The proximal V4 left vertebral artery is poorly delineated, suggestive of high-grade stenosis or vessel occlusion at this site. However, flow related signal is present within the left PICA. The basilar artery is patent. The posterior cerebral arteries are patent. Moderate/severe stenoses within the proximal P2 posterior cerebral arteries bilaterally. IMPRESSION: MRI brain: 1. Subcentimeter acute cortical/subcortical infarcts within the posterior right frontal lobe and right parietal lobe (involving the pre and postcentral gyri). 2. Additional punctate acute right parietooccipital cortical infarct. 3. Mild cerebral atrophy and chronic small vessel ischemic disease. MRA head: 1. Intracranial atherosclerotic disease with findings most notably as follows. 2. The proximal V4 left vertebral artery is poorly delineated, suggestive of high-grade stenosis or occlusion. 3. Moderate right vertebral artery stenosis at the  level of the skull base. 4. Moderate/severe stenoses within the proximal P2 posterior cerebral arteries bilaterally. 5. Apparent severe stenoses within the proximal cavernous segments of both intracranial internal carotid arteries. Moderate stenosis within the cavernous right ICA more distally. 6. Moderate stenosis within an inferior division proximal M2 right MCA vessel. 7. 1-2 mm vascular protrusion arising from the pre-avernous left ICA which  may reflect atherosclerotic lobulation or a small aneurysm. Electronically Signed   By: Jackey Loge DO   On: 02/02/2020 12:14   US Carotid Bilateral (at [facility] and [facility] only)  Result Date: 02/03/2020 CLINICAL DATA:  Carotid atherosclerosis, hypertension, hyperlipidemia and diabetes EXAM: BILATERAL CAROTID DUPLEX ULTRASOUND TECHNIQUE: Wallace Cullens scale imaging, color Doppler and duplex ultrasound were performed of bilateral carotid and vertebral arteries in the neck. COMPARISON:  None. FINDINGS: Criteria: Quantification of carotid stenosis is based on velocity parameters that correlate the residual internal carotid diameter with NASCET-based stenosis levels, using the diameter of the distal internal carotid lumen as the denominator for stenosis measurement. The following velocity measurements were obtained: RIGHT ICA: 583/224 cm/sec CCA: 91/16 cm/sec SYSTOLIC ICA/CCA RATIO:  6.4 ECA: 315 cm/sec LEFT ICA: 122/30 cm/sec CCA: 131/26 cm/sec SYSTOLIC ICA/CCA RATIO:  0.9 ECA: 183 cm/sec RIGHT CAROTID ARTERY: Diffuse intimal thickening and moderate carotid atherosclerosis. Luminal narrowing noted of the right proximal ICA. In this region there is velocity elevation measuring 582/224 centimeters/second with turbulent flow and mild spectral broadening. Right ICA stenosis estimated at greater than 70% by ultrasound criteria. RIGHT VERTEBRAL ARTERY:  Normal antegrade flow LEFT CAROTID ARTERY: Similar intimal thickening and moderate atherosclerotic change. Despite this, there is no hemodynamically significant left ICA stenosis, velocity elevation, turbulent flow. Degree of narrowing less than 50% by ultrasound criteria. LEFT VERTEBRAL ARTERY: Remains antegrade with slight to and fro flow, indicative of some degree of vascular disease. IMPRESSION: Right ICA stenosis estimated at greater than 70% by ultrasound criteria. Left ICA stenosis less than 50%. Antegrade vertebral flow bilaterally, see above comment. Electronically Signed   By:  Judie Petit.  Shick M.D.   On: 02/03/2020 08:27   ECHOCARDIOGRAM COMPLETE  Result Date: 02/03/2020    ECHOCARDIOGRAM REPORT   Patient Name:   Danielle Paul Date of Exam: 02/03/2020 Medical Rec #:  [id number]   Height:       61.0 in Accession #:    [id number]  Weight:       165.0 lb Date of Birth:  September 04, 1955  BSA:          1.740 m Patient Age:    64 years    BP:           141/71 mmHg Patient Gender: F           HR:           75 bpm. Exam Location:  [facility] Procedure: 2D Echo Indications:     TIA G45.9  History:         Patient has no prior history of Echocardiogram examinations.  Sonographer:     Wonda Cerise RDCS Referring Phys:  [id number] AGBATA Diagnosing Phys: Adrian Blackwater MD IMPRESSIONS  1. Left ventricular ejection fraction, by estimation, is 60 to 65%. The left ventricle has normal function. The left ventricle has no regional wall motion abnormalities. There is mild concentric left ventricular hypertrophy. Left ventricular diastolic parameters are consistent with Grade I diastolic dysfunction (impaired relaxation).  2. Right ventricular systolic function is normal. The right ventricular size is normal.  3. Left atrial size was mildly  dilated.  4. Right atrial size was mildly dilated.  5. The mitral valve is normal in structure. Trivial mitral valve regurgitation. No evidence of mitral stenosis.  6. The aortic valve is normal in structure. Aortic valve regurgitation is not visualized. No aortic stenosis is present.  7. The inferior vena cava is normal in size with greater than 50% respiratory variability, suggesting right atrial pressure of 3 mmHg. Conclusion(s)/Recommendation(s): No intracardiac source of embolism detected on this transthoracic study. A transesophageal echocardiogram is recommended to exclude cardiac source of embolism if clinically indicated. FINDINGS  Left Ventricle: Left ventricular ejection fraction, by estimation, is 60 to 65%. The left ventricle has normal function. The left ventricle  has no regional wall motion abnormalities. The left ventricular internal cavity size was normal in size. There is  mild concentric left ventricular hypertrophy. Left ventricular diastolic parameters are consistent with Grade I diastolic dysfunction (impaired relaxation). Right Ventricle: The right ventricular size is normal. No increase in right ventricular wall thickness. Right ventricular systolic function is normal. Left Atrium: Left atrial size was mildly dilated. Right Atrium: Right atrial size was mildly dilated. Pericardium: There is no evidence of pericardial effusion. Mitral Valve: The mitral valve is normal in structure. Trivial mitral valve regurgitation. No evidence of mitral valve stenosis. Tricuspid Valve: The tricuspid valve is normal in structure. Tricuspid valve regurgitation is trivial. No evidence of tricuspid stenosis. Aortic Valve: The aortic valve is normal in structure. Aortic valve regurgitation is not visualized. No aortic stenosis is present. Aortic valve peak gradient measures 7.4 mmHg. Pulmonic Valve: The pulmonic valve was normal in structure. Pulmonic valve regurgitation is not visualized. No evidence of pulmonic stenosis. Aorta: The aortic root is normal in size and structure. Venous: The inferior vena cava is normal in size with greater than 50% respiratory variability, suggesting right atrial pressure of 3 mmHg. IAS/Shunts: No atrial level shunt detected by color flow Doppler.  LEFT VENTRICLE PLAX 2D LVIDd:         4.11 cm  Diastology LVIDs:         2.85 cm  LV e' medial:    5.22 cm/s LV PW:         0.92 cm  LV E/e' medial:  19.3 LV IVS:        1.12 cm  LV e' lateral:   5.98 cm/s LVOT diam:     1.80 cm  LV E/e' lateral: 16.9 LV SV:         48 LV SV Index:   28 LVOT Area:     2.54 cm  RIGHT VENTRICLE RV Basal diam:  2.55 cm RV S prime:     12.40 cm/s TAPSE (M-mode): 1.6 cm LEFT ATRIUM             Index       RIGHT ATRIUM          Index LA diam:        3.40 cm 1.95 cm/m  RA Area:      9.04 cm LA Vol (A2C):   20.6 ml 11.84 ml/m RA Volume:   17.40 ml 10.00 ml/m LA Vol (A4C):   25.0 ml 14.36 ml/m LA Biplane Vol: 24.2 ml 13.90 ml/m  AORTIC VALVE                PULMONIC VALVE AV Area (Vmax): 1.56 cm    PV Vmax:       1.07 m/s AV Vmax:        136.00  cm/s PV Peak grad:  4.6 mmHg AV Peak Grad:   7.4 mmHg LVOT Vmax:      83.60 cm/s LVOT Vmean:     56.300 cm/s LVOT VTI:       0.189 m  AORTA Ao Root diam: 2.30 cm Ao Asc diam:  2.50 cm MITRAL VALVE MV Area (PHT): 4.17 cm     SHUNTS MV Decel Time: 182 msec     Systemic VTI:  0.19 m MV E velocity: 101.00 cm/s  Systemic Diam: 1.80 cm MV A velocity: 112.00 cm/s MV E/A ratio:  0.90 Adrian Blackwater MD Electronically signed by Adrian Blackwater MD Signature Date/Time: 02/03/2020/11:32:26 AM    Final    ECHOCARDIOGRAM LIMITED BUBBLE STUDY  Result Date: 02/06/2020    ECHOCARDIOGRAM LIMITED REPORT   Patient Name:   Danielle Paul Date of Exam: 02/05/2020 Medical Rec #:  [id number]   Height:       61.0 in Accession #:    [id number]  Weight:       165.0 lb Date of Birth:  [birth year]  BSA:          1.740 m Patient Age:    64 years    BP:           147/56 mmHg Patient Gender: F           HR:           66 bpm. Exam Location:  [facility] Procedure: Saline Contrast Bubble Study and Limited Echo Indications:     I63.9 Stroke  History:         Patient has prior history of Echocardiogram examinations, most                  recent 02/03/2020. Risk Factors:Hypertension, Diabetes and HCL.  Sonographer:     Humphrey Rolls RDCS (AE) Referring Phys:  [id number] [facility] Diagnosing Phys: Adrian Blackwater MD  FINDINGS  IAS/Shunts: No atrial level shunt detected by color flow Doppler. Agitated saline contrast was given intravenously to evaluate for intracardiac shunting. No ventricular septal defect is seen or detected. There is no evidence of an atrial septal defect. Adrian Blackwater MD Electronically signed by Adrian Blackwater MD Signature Date/Time: 02/06/2020/8:36:02 AM    Final      Microbiology: Recent Results (from the past 240 hour(s))  Resp Panel by RT-PCR (Flu A&B, Covid) Nasopharyngeal Swab     Status: None   Collection Time: 02/01/20 11:44 PM   Specimen: Nasopharyngeal Swab; Nasopharyngeal(NP) swabs in vial transport medium  Result Value Ref Range Status   SARS Coronavirus 2 by RT PCR NEGATIVE NEGATIVE Final    Comment: (NOTE) SARS-CoV-2 target nucleic acids are NOT DETECTED.  The SARS-CoV-2 RNA is generally detectable in upper respiratory specimens during the acute phase of infection. The lowest concentration of SARS-CoV-2 viral copies this assay can detect is 138 copies/mL. A negative result does not preclude SARS-Cov-2 infection and should not be used as the sole basis for treatment or other patient management decisions. A negative result may occur with  improper specimen collection/handling, submission of specimen other than nasopharyngeal swab, presence of viral mutation(s) within the areas targeted by this assay, and inadequate number of viral copies(<138 copies/mL). A negative result must be combined with clinical observations, patient history, and epidemiological information. The expected result is Negative.  Fact Sheet for Patients:  BloggerCourse.com  Fact Sheet for Healthcare Providers:  SeriousBroker.it  This test is no t yet approved or cleared by the [place] FDA  and  has been authorized for detection and/or diagnosis of SARS-CoV-2 by FDA under an Emergency Use Authorization (EUA). This EUA will remain  in effect (meaning this test can be used) for the duration of the COVID-19 declaration under Section 564(b)(1) of the Act, 21 U.S.C.section 360bbb-3(b)(1), unless the authorization is terminated  or revoked sooner.       Influenza A by PCR NEGATIVE NEGATIVE Final   Influenza B by PCR NEGATIVE NEGATIVE Final    Comment: (NOTE) The Xpert Xpress SARS-CoV-2/FLU/RSV plus assay is  intended as an aid in the diagnosis of influenza from Nasopharyngeal swab specimens and should not be used as a sole basis for treatment. Nasal washings and aspirates are unacceptable for Xpert Xpress SARS-CoV-2/FLU/RSV testing.  Fact Sheet for Patients: BloggerCourse.com  Fact Sheet for Healthcare Providers: SeriousBroker.it  This test is not yet approved or cleared by the [place] FDA and has been authorized for detection and/or diagnosis of SARS-CoV-2 by FDA under an Emergency Use Authorization (EUA). This EUA will remain in effect (meaning this test can be used) for the duration of the COVID-19 declaration under Section 564(b)(1) of the Act, 21 U.S.C. section 360bbb-3(b)(1), unless the authorization is terminated or revoked.  Performed at [facility], [address], [place], [place] [address]   Urine Culture     Status: None   Collection Time: 02/03/20  6:55 PM   Specimen: Urine, Random  Result Value Ref Range Status   Specimen Description   Final    URINE, RANDOM Performed at [facility], [address] [place], [place] [address]    Special Requests   Final    NONE Performed at [facility], [address], [place], [place] [address]    Culture   Final    NO GROWTH Performed at [facility], [address] [place], [place] [address]    Report Status 02/05/2020 FINAL  Final     Labs: Basic Metabolic Panel: Recent Labs  Lab 02/05/20 0421 02/06/20 0601  NA 134* 134*  K 4.2 4.7  CL 106 105  CO2 20* 22  GLUCOSE 113* 107*  BUN 41* 38*  CREATININE 1.71* 1.56*  CALCIUM 8.5* 8.9   Liver Function Tests: No results for input(s): AST, ALT, ALKPHOS, BILITOT, PROT, ALBUMIN in the last 168 hours. No results for input(s): LIPASE, AMYLASE in the last 168 hours. No results for input(s): AMMONIA in the last 168 hours. CBC: No results for input(s): WBC, NEUTROABS, HGB, HCT,  MCV, PLT in the last 168 hours. Cardiac Enzymes: No results for input(s): CKTOTAL, CKMB, CKMBINDEX, TROPONINI in the last 168 hours. BNP: BNP (last 3 results) No results for input(s): BNP in the last 8760 hours.  ProBNP (last 3 results) No results for input(s): PROBNP in the last 8760 hours.  CBG: Recent Labs  Lab 02/05/20 1136 02/05/20 1529 02/06/20 0015 02/06/20 0845 02/06/20 1228  GLUCAP 177* 134* 118* 129* 119*    Principal Problem:   TIA (transient ischemic attack) Active Problems:   Diabetes mellitus without complication (HCC)   Hypertension   AKI (acute kidney injury) (HCC)   Acute lower UTI   CVA (cerebral vascular accident) (HCC)   Time coordinating discharge: 38 minutes  Signed:        Ritamarie Arkin, DO [facility]  02/11/2020, 4:57 PM

## 2020-03-05 ENCOUNTER — Other Ambulatory Visit: Payer: Self-pay

## 2020-03-05 ENCOUNTER — Encounter (INDEPENDENT_AMBULATORY_CARE_PROVIDER_SITE_OTHER): Payer: Self-pay | Admitting: Vascular Surgery

## 2020-03-05 ENCOUNTER — Ambulatory Visit (INDEPENDENT_AMBULATORY_CARE_PROVIDER_SITE_OTHER): Payer: BLUE CROSS/BLUE SHIELD | Admitting: Vascular Surgery

## 2020-03-05 VITALS — BP 190/93 | HR 65 | Resp 16 | Wt 166.8 lb

## 2020-03-05 DIAGNOSIS — I6523 Occlusion and stenosis of bilateral carotid arteries: Secondary | ICD-10-CM

## 2020-03-05 DIAGNOSIS — I639 Cerebral infarction, unspecified: Secondary | ICD-10-CM | POA: Diagnosis not present

## 2020-03-05 DIAGNOSIS — I10 Essential (primary) hypertension: Secondary | ICD-10-CM | POA: Diagnosis not present

## 2020-03-05 DIAGNOSIS — I6529 Occlusion and stenosis of unspecified carotid artery: Secondary | ICD-10-CM | POA: Insufficient documentation

## 2020-03-05 DIAGNOSIS — E119 Type 2 diabetes mellitus without complications: Secondary | ICD-10-CM

## 2020-03-05 DIAGNOSIS — N179 Acute kidney failure, unspecified: Secondary | ICD-10-CM | POA: Diagnosis not present

## 2020-03-05 NOTE — Assessment & Plan Note (Signed)
blood pressure control important in reducing the progression of atherosclerotic disease. On appropriate oral medications.  

## 2020-03-05 NOTE — Assessment & Plan Note (Signed)
The patient still has some residual deficits from her right hemispheric stroke few weeks ago.  Her left hand strength has returned but she still has coordination and fine motor issues as well as some sensory issues.  At this point, we need to consider treatment for carotid disease to avoid recurrent stroke.  Her renal function precluded CT angiogram.  At this point, a catheter-based angiogram will be planned.  This will help delineate the anatomy to determine the best treatment options going forward.  Given her significant renal dysfunction, a separate diagnostic carotid angiogram will be planned at this time.  Risks and benefits were discussed with the patient and she is agreeable to proceed.

## 2020-03-05 NOTE — Patient Instructions (Signed)
Angiogram  An angiogram is a procedure used to examine the blood vessels. In this procedure, contrast dye is injected through a long, thin tube (catheter) into an artery. X-rays are then taken, which show if there is a blockage or problem in a blood vessel. The catheter may be inserted in:  The groin area. This is the most common.  The fold of the arm, near the elbow.  The wrist. Tell a health care provider about:  Any allergies you have, including allergies to medicines, shellfish, contrast dye, or iodine.  All medicines you are taking, including vitamins, herbs, eye drops, creams, and over-the-counter medicines.  Any problems you or family members have had with anesthetic medicines.  Any blood disorders you have.  Any surgeries you have had.  Any medical conditions you have or have had, including any kidney problems or kidney failure.  Whether you are pregnant or may be pregnant.  Whether you are breastfeeding. What are the risks? Generally, this is a safe procedure. However, problems may occur, including:  Infection.  Bleeding and bruising.  Allergic reactions to medicines or dyes, including the contrast dye used.  Damage to nearby structures or organs, including damage to blood vessels and kidney damage from the contrast dye.  Blood clots that can lead to a stroke or heart attack. What happens before the procedure? Staying hydrated Follow instructions from your health care provider about hydration, which may include:  Up to 2 hours before the procedure - you may continue to drink clear liquids, such as water, clear fruit juice, black coffee, and plain tea.   Eating and drinking restrictions Follow instructions from your health care provider about eating and drinking, which may include:  8 hours before the procedure - stop eating heavy meals or foods, such as meat, fried foods, or fatty foods.  6 hours before the procedure - stop eating light meals or foods, such  as toast or cereal.  2 hours before the procedure - stop drinking clear liquids. Medicines Ask your health care provider about:  Changing or stopping your regular medicines. This is especially important if you are taking diabetes medicines or blood thinners.  Taking medicines such as aspirin and ibuprofen. These medicines can thin your blood. Do not take these medicines unless your health care provider tells you to take them.  Taking over-the-counter medicines, vitamins, herbs, and supplements. Surgery safety Ask your health care provider:  How your insertion site will be marked.  What steps will be taken to help prevent infection. These may include: ? Removing hair at the insertion site. ? Washing skin with a germ-killing soap. ? Taking antibiotic medicine. General instructions  Do not use any products that contain nicotine or tobacco for at least 4 weeks before the procedure. These products include cigarettes, e-cigarettes, and chewing tobacco. If you need help quitting, ask your health care provider.  You may have blood samples taken.  Plan to have someone take you home from the hospital or clinic.  If you will be going home right after the procedure, plan to have someone with you for 24 hours. What happens during the procedure?  You will lie on your back on an X-ray table. You may be strapped to the table if it is tilted.  An IV will be inserted into one of your veins.  Electrodes may be placed on your chest to monitor your heart rate during the procedure.  You will be given one or both of the following: ? A medicine   to help you relax (sedative). ? A medicine to numb the area where the catheter will be inserted (local anesthetic).  A small incision will be made for catheter insertion.  The catheter will be inserted into an artery using a guide wire. An X-ray procedure (fluoroscopy) will be used to help guide the catheter to the blood vessel to be examined.  A contrast  dye will then be injected into the catheter, and X-rays will be taken. The contrast will help to show where any narrowing or blockages are located in the blood vessels. You may feel flushed as the contrast dye is injected.  Tell your health care provider if you develop chest pain or trouble breathing.  After the fluoroscopy is complete, the catheter will be removed.  A bandage (dressing) will be placed over the site where the catheter was inserted. Pressure will be applied to help stop any bleeding.  The IV will be removed. The procedure may vary among health care providers and hospitals. What happens after the procedure?  Your blood pressure, heart rate, breathing rate, and blood oxygen level will be monitored until you leave the hospital or clinic.  You will be kept in bed lying flat for 6 hours. If the catheter was inserted through your leg, you will be instructed not to bend or cross your legs.  The insertion area and the pulse in your feet or wrist will be checked frequently.  You will be instructed to drink plenty of fluids. This will help wash the contrast dye out of your body.  You may have more blood tests and X-rays. You may also have a test that records the electrical activity of your heart (electrocardiogram, or ECG).  Do not drive for 24 hours if you were given a sedative during your procedure.  It is up to you to get the results of your procedure. Ask your health care provider, or the department that is doing the procedure, when your results will be ready. Summary  An angiogram is a procedure used to examine the blood vessels.  Before the procedure, follow your health care provider's instructions about eating and drinking restrictions. You may be asked to stop eating and drinking several hours before the procedure.  During the procedure, contrast dye is injected through a thin tube (catheter) into an artery. X-rays are then taken.  After the procedure, you will need to  drink plenty of fluids and lie flat for 6 hour. This information is not intended to replace advice given to you by your health care provider. Make sure you discuss any questions you have with your health care provider. Document Revised: 08/24/2018 Document Reviewed: 08/24/2018 Elsevier Patient Education  2021 [employer]  

## 2020-03-05 NOTE — H&P (View-Only) (Signed)
MRN : [id number]  Danielle Paul is a 65 y.o. ([birth year]) female who presents with chief complaint of  Chief Complaint  Patient presents with  . Follow-up    Discuss carotid angio  .  History of Present Illness: Patient returns today in follow up of carotid disease.  She was seen in the hospital few weeks ago with right hemispheric stroke with left arm weakness and numbness.  This has improved but not entirely resolved.  She seems to have return of her strength, but still has fine motor deficits in the left hand, coordination issues, and sensation issues.  She denies any new symptoms since that time.  She is on appropriate medical therapy with aspirin, Plavix, and Crestor.  A carotid duplex that suggested high-grade stenosis, but a CT angiogram could not be done due to renal dysfunction while she was in the hospital.  Current Outpatient Medications  Medication Sig Dispense Refill  . aspirin 81 MG EC tablet Take 81 mg by mouth daily.    . carvedilol (COREG) 25 MG tablet Take 25 mg by mouth 2 (two) times daily.    . clopidogrel (PLAVIX) 75 MG tablet Take 1 tablet (75 mg total) by mouth daily. 30 tablet 0  . FLUoxetine (PROZAC) 10 MG tablet Take 10 mg by mouth daily.    . insulin lispro (HUMALOG) 100 UNIT/ML injection Inject 3-9 Units into the skin 3 (three) times daily before meals. (based upon blood glucose reading)    . LANTUS SOLOSTAR 100 UNIT/ML Solostar Pen Inject 30 Units into the skin at bedtime.    . polyethylene glycol (MIRALAX / GLYCOLAX) 17 g packet Take 17 g by mouth daily. 14 each 0  . rosuvastatin (CRESTOR) 40 MG tablet Take 40 mg by mouth daily.    Marland Kitchen FLUoxetine (PROZAC) 10 MG tablet Take 10 mg by mouth daily.     No current facility-administered medications for this visit.    Past Medical History:  Diagnosis Date  . Diabetes mellitus without complication (HCC)   . Hypercholesteremia   . Hypertension     Past Surgical History:  Procedure Laterality Date  . AORTIC  VALVE REPLACEMENT (AVR)/CORONARY ARTERY BYPASS GRAFTING (CABG)  2009     Social History   Tobacco Use  . Smoking status: Never Smoker  . Smokeless tobacco: Never Used  Substance Use Topics  . Alcohol use: Never  . Drug use: Never      Family History  Problem Relation Age of Onset  . Other Mother   . Hypertension Mother   . Hypertension Father   No bleeding or clotting disorders  Allergies  Allergen Reactions  . Atorvastatin Other (See Comments)    Muscle Pains  . Isosorbide Nitrate Other (See Comments)    Jittery and lightheadedness.  . Pravastatin Other (See Comments)  . Simvastatin Other (See Comments)    Muscle Pains     REVIEW OF SYSTEMS (Negative unless checked)  Constitutional: [] Weight loss  [] Fever  [] Chills Cardiac: [] Chest pain   [] Chest pressure   [] Palpitations   [] Shortness of breath when laying flat   [] Shortness of breath at rest   [] Shortness of breath with exertion. Vascular:  [] Pain in legs with walking   [] Pain in legs at rest   [] Pain in legs when laying flat   [] Claudication   [] Pain in feet when walking  [] Pain in feet at rest  [] Pain in feet when laying flat   [] History of DVT   [] Phlebitis   [] Swelling  in legs   [] Varicose veins   [] Non-healing ulcers Pulmonary:   [] Uses home oxygen   [] Productive cough   [] Hemoptysis   [] Wheeze  [] COPD   [] Asthma Neurologic:  [] Dizziness  [] Blackouts   [] Seizures   [x] History of stroke   [x] History of TIA  [] Aphasia   [] Temporary blindness   [] Dysphagia   [x] Weakness or numbness in arms   [] Weakness or numbness in legs Musculoskeletal:  [x] Arthritis   [] Joint swelling   [x] Joint pain   [] Low back pain Hematologic:  [] Easy bruising  [] Easy bleeding   [] Hypercoagulable state   [] Anemic   Gastrointestinal:  [] Blood in stool   [] Vomiting blood  [] Gastroesophageal reflux/heartburn   [] Abdominal pain Genitourinary:  [x] Chronic kidney disease   [] Difficult urination  [] Frequent urination  [] Burning with urination    [] Hematuria Skin:  [] Rashes   [] Ulcers   [] Wounds Psychological:  [] History of anxiety   []  History of major depression.  Physical Examination  BP (!) 190/93 (BP Location: Right Arm)   Pulse 65   Resp 16   Wt 166 lb 12.8 oz (75.7 kg)   BMI 31.52 kg/m  Gen:  WD/WN, NAD Head: [place]/AT, No temporalis wasting. Ear/Nose/Throat: Hearing grossly intact, nares w/o erythema or drainage Eyes: Conjunctiva clear. Sclera non-icteric Neck: Supple.  Trachea midline Pulmonary:  Good air movement, no use of accessory muscles.  Cardiac: RRR, no JVD Vascular:  Vessel Right Left  Radial Palpable Palpable           Musculoskeletal: M/S 5/5 throughout.  No deformity or atrophy.  Trace left lower extremity edema.  She does have some fine motor deficits in the left hand although her strength has entirely returned Neurologic: Sensation grossly intact in extremities.  Symmetrical.  Speech is somewhat slow and she does do some word searching.Marland Kitchen  Psychiatric: Judgment intact, Mood & affect appropriate for pt's clinical situation. Dermatologic: No rashes or ulcers noted.  No cellulitis or open wounds.       Labs Recent Results (from the past 2160 hour(s))  Protime-INR     Status: None   Collection Time: 02/01/20 11:44 PM  Result Value Ref Range   Prothrombin Time 13.1 11.4 - 15.2 seconds   INR 1.0 0.8 - 1.2    Comment: (NOTE) INR goal varies based on device and disease states. Performed at [facility], [address] [place], [place] [address]   APTT     Status: None   Collection Time: 02/01/20 11:44 PM  Result Value Ref Range   aPTT 31 24 - 36 seconds    Comment: Performed at [facility], [address] [place], [place] [address]  CBC     Status: Abnormal   Collection Time: 02/01/20 11:44 PM  Result Value Ref Range   WBC 6.3 4.0 - 10.5 K/uL   RBC 4.79 3.87 - 5.11 MIL/uL   Hemoglobin 12.6 12.0 - 15.0 g/dL   HCT 86.5 78.4 - 69.6 %   MCV 79.5 (L) 80.0 - 100.0 fL   MCH  26.3 26.0 - 34.0 pg   MCHC 33.1 30.0 - 36.0 g/dL   RDW 29.5 28.4 - 13.2 %   Platelets 331 150 - 400 K/uL   nRBC 0.0 0.0 - 0.2 %    Comment: Performed at [facility], [address] [place], [place] [address]  Differential     Status: None   Collection Time: 02/01/20 11:44 PM  Result Value Ref Range   Neutrophils Relative % 63 %  Neutro Abs 3.9 1.7 - 7.7 K/uL   Lymphocytes Relative 25 %   Lymphs Abs 1.6 0.7 - 4.0 K/uL   Monocytes Relative 10 %   Monocytes Absolute 0.6 0.1 - 1.0 K/uL   Eosinophils Relative 1 %   Eosinophils Absolute 0.1 0.0 - 0.5 K/uL   Basophils Relative 1 %   Basophils Absolute 0.1 0.0 - 0.1 K/uL   Immature Granulocytes 0 %   Abs Immature Granulocytes 0.01 0.00 - 0.07 K/uL    Comment: Performed at [facility], [address] [place], [place] [address]  Comprehensive metabolic panel     Status: Abnormal   Collection Time: 02/01/20 11:44 PM  Result Value Ref Range   Sodium 135 135 - 145 mmol/L   Potassium 4.5 3.5 - 5.1 mmol/L   Chloride 99 98 - 111 mmol/L   CO2 25 22 - 32 mmol/L   Glucose, Bld 155 (H) 70 - 99 mg/dL    Comment: Glucose reference range applies only to samples taken after fasting for at least 8 hours.   BUN 39 (H) 8 - 23 mg/dL   Creatinine, Ser 6.042.42 (H) 0.44 - 1.00 mg/dL   Calcium 9.4 8.9 - 54.010.3 mg/dL   Total Protein 8.0 6.5 - 8.1 g/dL   Albumin 4.1 3.5 - 5.0 g/dL   AST 23 15 - 41 U/L   ALT 21 0 - 44 U/L   Alkaline Phosphatase 76 38 - 126 U/L   Total Bilirubin 0.5 0.3 - 1.2 mg/dL   GFR, Estimated 22 (L) >60 mL/min    Comment: (NOTE) Calculated using the CKD-EPI Creatinine Equation (2021)    Anion gap 11 5 - 15    Comment: Performed at [facility], [address] [place], [place] [address]  Resp Panel by RT-PCR (Flu A&B, Covid) Nasopharyngeal Swab     Status: None   Collection Time: 02/01/20 11:44 PM   Specimen: Nasopharyngeal Swab; Nasopharyngeal(NP) swabs in vial transport medium  Result Value Ref  Range   SARS Coronavirus 2 by RT PCR NEGATIVE NEGATIVE    Comment: (NOTE) SARS-CoV-2 target nucleic acids are NOT DETECTED.  The SARS-CoV-2 RNA is generally detectable in upper respiratory specimens during the acute phase of infection. The lowest concentration of SARS-CoV-2 viral copies this assay can detect is 138 copies/mL. A negative result does not preclude SARS-Cov-2 infection and should not be used as the sole basis for treatment or other patient management decisions. A negative result may occur with  improper specimen collection/handling, submission of specimen other than nasopharyngeal swab, presence of viral mutation(s) within the areas targeted by this assay, and inadequate number of viral copies(<138 copies/mL). A negative result must be combined with clinical observations, patient history, and epidemiological information. The expected result is Negative.  Fact Sheet for Patients:  BloggerCourse.comhttps://www.fda.gov/media/152166/download  Fact Sheet for Healthcare Providers:  SeriousBroker.ithttps://www.fda.gov/media/152162/download  This test is no t yet approved or cleared by the [place] FDA and  has been authorized for detection and/or diagnosis of SARS-CoV-2 by FDA under an Emergency Use Authorization (EUA). This EUA will remain  in effect (meaning this test can be used) for the duration of the COVID-19 declaration under Section 564(b)(1) of the Act, 21 U.S.C.section 360bbb-3(b)(1), unless the authorization is terminated  or revoked sooner.       Influenza A by PCR NEGATIVE NEGATIVE   Influenza B by PCR NEGATIVE NEGATIVE    Comment: (NOTE) The Xpert Xpress SARS-CoV-2/FLU/RSV plus assay is intended as an aid in  the diagnosis of influenza from Nasopharyngeal swab specimens and should not be used as a sole basis for treatment. Nasal washings and aspirates are unacceptable for Xpert Xpress SARS-CoV-2/FLU/RSV testing.  Fact Sheet for  Patients: BloggerCourse.com  Fact Sheet for Healthcare Providers: SeriousBroker.it  This test is not yet approved or cleared by the [place] FDA and has been authorized for detection and/or diagnosis of SARS-CoV-2 by FDA under an Emergency Use Authorization (EUA). This EUA will remain in effect (meaning this test can be used) for the duration of the COVID-19 declaration under Section 564(b)(1) of the Act, 21 U.S.C. section 360bbb-3(b)(1), unless the authorization is terminated or revoked.  Performed at [facility], [address] [place], [place] [address]   Hemoglobin A1c     Status: Abnormal   Collection Time: 02/01/20 11:44 PM  Result Value Ref Range   Hgb A1c MFr Bld 9.4 (H) 4.8 - 5.6 %    Comment: (NOTE) Pre diabetes:          5.7%-6.4%  Diabetes:              >6.4%  Glycemic control for   <7.0% adults with diabetes    Mean Plasma Glucose 223.08 mg/dL    Comment: Performed at [facility], [address], [place], [place] [address]  Troponin I (High Sensitivity)     Status: None   Collection Time: 02/02/20  9:43 AM  Result Value Ref Range   Troponin I (High Sensitivity) 7 <18 ng/L    Comment: (NOTE) Elevated high sensitivity troponin I (hsTnI) values and significant  changes across serial measurements may suggest ACS but many other  chronic and acute conditions are known to elevate hsTnI results.  Refer to the "Links" section for chest pain algorithms and additional  guidance. Performed at [facility], [address] [place], [place] [address]   Urinalysis, Complete w Microscopic     Status: Abnormal   Collection Time: 02/02/20  9:43 AM  Result Value Ref Range   Color, Urine YELLOW (A) YELLOW   APPearance HAZY (A) CLEAR   Specific Gravity, Urine 1.009 1.005 - 1.030   pH 6.0 5.0 - 8.0   Glucose, UA 50 (A) NEGATIVE mg/dL   Hgb urine dipstick SMALL (A) NEGATIVE   Bilirubin Urine  NEGATIVE NEGATIVE   Ketones, ur NEGATIVE NEGATIVE mg/dL   Protein, ur NEGATIVE NEGATIVE mg/dL   Nitrite POSITIVE (A) NEGATIVE   Leukocytes,Ua LARGE (A) NEGATIVE   RBC / HPF 6-10 0 - 5 RBC/hpf   WBC, UA 21-50 0 - 5 WBC/hpf   Bacteria, UA FEW (A) NONE SEEN   Squamous Epithelial / LPF 0-5 0 - 5   Mucus PRESENT     Comment: Performed at [facility], [address] [place], [place] [address]  HIV Antibody (routine testing w rflx)     Status: None   Collection Time: 02/02/20 12:10 PM  Result Value Ref Range   HIV Screen 4th Generation wRfx Non Reactive Non Reactive    Comment: Performed at [facility], [address] [place], [place] [address]  CBG monitoring, ED     Status: Abnormal   Collection Time: 02/02/20 12:21 PM  Result Value Ref Range   Glucose-Capillary 122 (H) 70 - 99 mg/dL    Comment: Glucose reference range applies only to samples taken after fasting for at least 8 hours.  Glucose, capillary     Status: Abnormal   Collection Time: 02/02/20  4:08 PM  Result Value Ref Range   Glucose-Capillary 135 (H) 70 - 99 mg/dL    Comment: Glucose reference range applies only to samples taken after fasting for at least 8 hours.  Glucose, capillary     Status: Abnormal   Collection Time: 02/02/20  8:55 PM  Result Value Ref Range   Glucose-Capillary 104 (H) 70 - 99 mg/dL    Comment: Glucose reference range applies only to samples taken after fasting for at least 8 hours.  Glucose, capillary     Status: Abnormal   Collection Time: 02/03/20 12:52 AM  Result Value Ref Range   Glucose-Capillary 111 (H) 70 - 99 mg/dL    Comment: Glucose reference range applies only to samples taken after fasting for at least 8 hours.  Glucose, capillary     Status: Abnormal   Collection Time: 02/03/20  3:40 AM  Result Value Ref Range   Glucose-Capillary 105 (H) 70 - 99 mg/dL    Comment: Glucose reference range applies only to samples taken after fasting for at least 8 hours.  Hemoglobin  A1c     Status: Abnormal   Collection Time: 02/03/20  3:48 AM  Result Value Ref Range   Hgb A1c MFr Bld 9.4 (H) 4.8 - 5.6 %    Comment: (NOTE) Pre diabetes:          5.7%-6.4%  Diabetes:              >6.4%  Glycemic control for   <7.0% adults with diabetes    Mean Plasma Glucose 223.08 mg/dL    Comment: Performed at [facility], [address] [place], [place] [address]  Lipid panel     Status: Abnormal   Collection Time: 02/03/20  3:48 AM  Result Value Ref Range   Cholesterol 150 0 - 200 mg/dL   Triglycerides 664 <403 mg/dL   HDL 39 (L) >47 mg/dL   Total CHOL/HDL Ratio 3.8 RATIO   VLDL 20 0 - 40 mg/dL   LDL Cholesterol 91 0 - 99 mg/dL    Comment:        Total Cholesterol/HDL:CHD Risk Coronary Heart Disease Risk Table                     Men   Women  1/2 Average Risk   3.4   3.3  Average Risk       5.0   4.4  2 X Average Risk   9.6   7.1  3 X Average Risk  23.4   11.0        Use the calculated Patient Ratio above and the CHD Risk Table to determine the patient's CHD Risk.        ATP III CLASSIFICATION (LDL):  <100     mg/dL   Optimal  425-956  mg/dL   Near or Above                    Optimal  130-159  mg/dL   Borderline  387-564  mg/dL   High  >332     mg/dL   Very High Performed at [facility], [address] [place], [place] [address]   Glucose, capillary     Status: Abnormal   Collection Time: 02/03/20  8:33 AM  Result Value Ref Range   Glucose-Capillary 108 (H) 70 - 99 mg/dL    Comment: Glucose reference range applies only to samples taken after fasting for at  least 8 hours.  ECHOCARDIOGRAM COMPLETE     Status: None   Collection Time: 02/03/20 10:22 AM  Result Value Ref Range   BP 141/71 mmHg   Ao pk vel 1.36 m/s   AR max vel 1.56 cm2   AV Peak grad 7.4 mmHg   S' Lateral 2.85 cm   Area-P 1/2 4.17 cm2  Glucose, capillary     Status: Abnormal   Collection Time: 02/03/20 12:11 PM  Result Value Ref Range   Glucose-Capillary 190 (H) 70  - 99 mg/dL    Comment: Glucose reference range applies only to samples taken after fasting for at least 8 hours.  Glucose, capillary     Status: Abnormal   Collection Time: 02/03/20  4:20 PM  Result Value Ref Range   Glucose-Capillary 136 (H) 70 - 99 mg/dL    Comment: Glucose reference range applies only to samples taken after fasting for at least 8 hours.  Urine Culture     Status: None   Collection Time: 02/03/20  6:55 PM   Specimen: Urine, Random  Result Value Ref Range   Specimen Description      URINE, RANDOM Performed at [facility], [address] [place], [place] [address]    Special Requests      NONE Performed at [facility], [address] [place], [place] [address]    Culture      NO GROWTH Performed at [facility], [address] [place], [place] [address]    Report Status 02/05/2020 FINAL   Glucose, capillary     Status: Abnormal   Collection Time: 02/03/20  7:47 PM  Result Value Ref Range   Glucose-Capillary 122 (H) 70 - 99 mg/dL    Comment: Glucose reference range applies only to samples taken after fasting for at least 8 hours.  Glucose, capillary     Status: Abnormal   Collection Time: 02/04/20 12:01 AM  Result Value Ref Range   Glucose-Capillary 124 (H) 70 - 99 mg/dL    Comment: Glucose reference range applies only to samples taken after fasting for at least 8 hours.  Glucose, capillary     Status: None   Collection Time: 02/04/20  3:58 AM  Result Value Ref Range   Glucose-Capillary 81 70 - 99 mg/dL    Comment: Glucose reference range applies only to samples taken after fasting for at least 8 hours.  CBC with Differential/Platelet     Status: Abnormal   Collection Time: 02/04/20  5:24 AM  Result Value Ref Range   WBC 5.8 4.0 - 10.5 K/uL   RBC 4.25 3.87 - 5.11 MIL/uL   Hemoglobin 11.1 (L) 12.0 - 15.0 g/dL   HCT 95.6 (L) 21.3 - 08.6 %   MCV 80.2 80.0 - 100.0 fL   MCH 26.1 26.0 - 34.0 pg   MCHC 32.6 30.0 - 36.0 g/dL    RDW 57.8 46.9 - 62.9 %   Platelets 289 150 - 400 K/uL   nRBC 0.0 0.0 - 0.2 %   Neutrophils Relative % 50 %   Neutro Abs 3.0 1.7 - 7.7 K/uL   Lymphocytes Relative 37 %   Lymphs Abs 2.1 0.7 - 4.0 K/uL   Monocytes Relative 10 %   Monocytes Absolute 0.6 0.1 - 1.0 K/uL   Eosinophils Relative 2 %   Eosinophils Absolute 0.1 0.0 - 0.5 K/uL   Basophils Relative 1 %   Basophils Absolute 0.0 0.0 - 0.1 K/uL   Immature  Granulocytes 0 %   Abs Immature Granulocytes 0.02 0.00 - 0.07 K/uL    Comment: Performed at [facility], [address] [place], [place] [address]  Basic metabolic panel     Status: Abnormal   Collection Time: 02/04/20  5:24 AM  Result Value Ref Range   Sodium 133 (L) 135 - 145 mmol/L   Potassium 4.1 3.5 - 5.1 mmol/L   Chloride 104 98 - 111 mmol/L   CO2 21 (L) 22 - 32 mmol/L   Glucose, Bld 99 70 - 99 mg/dL    Comment: Glucose reference range applies only to samples taken after fasting for at least 8 hours.   BUN 37 (H) 8 - 23 mg/dL   Creatinine, Ser 2.37 (H) 0.44 - 1.00 mg/dL   Calcium 8.7 (L) 8.9 - 10.3 mg/dL   GFR, Estimated 35 (L) >60 mL/min    Comment: (NOTE) Calculated using the CKD-EPI Creatinine Equation (2021)    Anion gap 8 5 - 15    Comment: Performed at [facility], [address] [place], [place] [address]  Glucose, capillary     Status: None   Collection Time: 02/04/20  7:29 AM  Result Value Ref Range   Glucose-Capillary 97 70 - 99 mg/dL    Comment: Glucose reference range applies only to samples taken after fasting for at least 8 hours.  Glucose, capillary     Status: Abnormal   Collection Time: 02/04/20 11:15 AM  Result Value Ref Range   Glucose-Capillary 122 (H) 70 - 99 mg/dL    Comment: Glucose reference range applies only to samples taken after fasting for at least 8 hours.  Glucose, capillary     Status: Abnormal   Collection Time: 02/04/20  3:56 PM  Result Value Ref Range   Glucose-Capillary 117 (H) 70 - 99 mg/dL     Comment: Glucose reference range applies only to samples taken after fasting for at least 8 hours.  Glucose, capillary     Status: Abnormal   Collection Time: 02/04/20  9:12 PM  Result Value Ref Range   Glucose-Capillary 100 (H) 70 - 99 mg/dL    Comment: Glucose reference range applies only to samples taken after fasting for at least 8 hours.   Comment 1 Notify RN    Comment 2 Document in Chart   Glucose, capillary     Status: Abnormal   Collection Time: 02/05/20  4:05 AM  Result Value Ref Range   Glucose-Capillary 111 (H) 70 - 99 mg/dL    Comment: Glucose reference range applies only to samples taken after fasting for at least 8 hours.  Basic metabolic panel     Status: Abnormal   Collection Time: 02/05/20  4:21 AM  Result Value Ref Range   Sodium 134 (L) 135 - 145 mmol/L   Potassium 4.2 3.5 - 5.1 mmol/L   Chloride 106 98 - 111 mmol/L   CO2 20 (L) 22 - 32 mmol/L   Glucose, Bld 113 (H) 70 - 99 mg/dL    Comment: Glucose reference range applies only to samples taken after fasting for at least 8 hours.   BUN 41 (H) 8 - 23 mg/dL   Creatinine, Ser 5.17 (H) 0.44 - 1.00 mg/dL   Calcium 8.5 (L) 8.9 - 10.3 mg/dL   GFR, Estimated 33 (L) >60 mL/min    Comment: (NOTE) Calculated using the CKD-EPI Creatinine Equation (2021)    Anion gap 8 5 - 15    Comment: Performed at  [facility], [address], [place], [place] [address]  Glucose, capillary     Status: Abnormal   Collection Time: 02/05/20  7:30 AM  Result Value Ref Range   Glucose-Capillary 109 (H) 70 - 99 mg/dL    Comment: Glucose reference range applies only to samples taken after fasting for at least 8 hours.  Glucose, capillary     Status: Abnormal   Collection Time: 02/05/20 11:36 AM  Result Value Ref Range   Glucose-Capillary 177 (H) 70 - 99 mg/dL    Comment: Glucose reference range applies only to samples taken after fasting for at least 8 hours.  Glucose, capillary     Status: Abnormal   Collection Time: 02/05/20   3:29 PM  Result Value Ref Range   Glucose-Capillary 134 (H) 70 - 99 mg/dL    Comment: Glucose reference range applies only to samples taken after fasting for at least 8 hours.  Glucose, capillary     Status: Abnormal   Collection Time: 02/06/20 12:15 AM  Result Value Ref Range   Glucose-Capillary 118 (H) 70 - 99 mg/dL    Comment: Glucose reference range applies only to samples taken after fasting for at least 8 hours.  Basic metabolic panel     Status: Abnormal   Collection Time: 02/06/20  6:01 AM  Result Value Ref Range   Sodium 134 (L) 135 - 145 mmol/L   Potassium 4.7 3.5 - 5.1 mmol/L   Chloride 105 98 - 111 mmol/L   CO2 22 22 - 32 mmol/L   Glucose, Bld 107 (H) 70 - 99 mg/dL    Comment: Glucose reference range applies only to samples taken after fasting for at least 8 hours.   BUN 38 (H) 8 - 23 mg/dL   Creatinine, Ser 7.86 (H) 0.44 - 1.00 mg/dL   Calcium 8.9 8.9 - 76.7 mg/dL   GFR, Estimated 37 (L) >60 mL/min    Comment: (NOTE) Calculated using the CKD-EPI Creatinine Equation (2021)    Anion gap 7 5 - 15    Comment: Performed at [facility], [address] [place], [place] [address]  Glucose, capillary     Status: Abnormal   Collection Time: 02/06/20  8:45 AM  Result Value Ref Range   Glucose-Capillary 129 (H) 70 - 99 mg/dL    Comment: Glucose reference range applies only to samples taken after fasting for at least 8 hours.  Glucose, capillary     Status: Abnormal   Collection Time: 02/06/20 12:28 PM  Result Value Ref Range   Glucose-Capillary 119 (H) 70 - 99 mg/dL    Comment: Glucose reference range applies only to samples taken after fasting for at least 8 hours.    Radiology ECHOCARDIOGRAM LIMITED BUBBLE STUDY  Result Date: 02/06/2020    ECHOCARDIOGRAM LIMITED REPORT   Patient Name:   Danielle Paul Date of Exam: 02/05/2020 Medical Rec #:  [id number]   Height:       61.0 in Accession #:    [id number]  Weight:       165.0 lb Date of Birth:  1955/11/23  BSA:           1.740 m Patient Age:    64 years    BP:           147/56 mmHg Patient Gender: F           HR:           66 bpm. Exam Location:  [facility] Procedure:  Saline Contrast Bubble Study and Limited Echo Indications:     I63.9 Stroke  History:         Patient has prior history of Echocardiogram examinations, most                  recent 02/03/2020. Risk Factors:Hypertension, Diabetes and HCL.  Sonographer:     Humphrey RollsJoan Heiss RDCS (AE) Referring Phys:  [id number] [facility] K[facility]IQ[facility] Diagnosing Phys: Adrian BlackwaterShaukat Khan MD  FINDINGS  IAS/Shunts: No atrial level shunt detected by color flow Doppler. Agitated saline contrast was given intravenously to evaluate for intracardiac shunting. No ventricular septal defect is seen or detected. There is no evidence of an atrial septal defect. Adrian BlackwaterShaukat Khan MD Electronically signed by Adrian BlackwaterShaukat Khan MD Signature Date/Time: 02/06/2020/8:36:02 AM    Final     Assessment/Plan  AKI (acute kidney injury) (HCC) With baseline chronic kidney disease.  CT scan was not possible due to her poor renal function.  CVA (cerebral vascular accident) [facility](HCC) The patient still has some residual deficits from her right hemispheric stroke few weeks ago.  Her left hand strength has returned but she still has coordination and fine motor issues as well as some sensory issues.  At this point, we need to consider treatment for carotid disease to avoid recurrent stroke.  Her renal function precluded CT angiogram.  At this point, a catheter-based angiogram will be planned.  This will help delineate the anatomy to determine the best treatment options going forward.  Given her significant renal dysfunction, a separate diagnostic carotid angiogram will be planned at this time.  Risks and benefits were discussed with the patient and she is agreeable to proceed.  Carotid stenosis The patient still has some residual deficits from her right hemispheric stroke few weeks ago.  Her left hand strength has returned but she still has  coordination and fine motor issues as well as some sensory issues.  At this point, we need to consider treatment for carotid disease to avoid recurrent stroke.  Her renal function precluded CT angiogram.  At this point, a catheter-based angiogram will be planned.  This will help delineate the anatomy to determine the best treatment options going forward.  Given her significant renal dysfunction, a separate diagnostic carotid angiogram will be planned at this time.  Risks and benefits were discussed with the patient and she is agreeable to proceed.  Hypertension blood pressure control important in reducing the progression of atherosclerotic disease. On appropriate oral medications.   Diabetes mellitus without complication (HCC) blood glucose control important in reducing the progression of atherosclerotic disease. Also, involved in wound healing. On appropriate medications.     Festus BarrenJason Carrin Vannostrand, MD  03/05/2020 11:55 AM    This note was created with Dragon medical transcription system.  Any errors from dictation are purely unintentional

## 2020-03-05 NOTE — Assessment & Plan Note (Signed)
With baseline chronic kidney disease.  CT scan was not possible due to her poor renal function.

## 2020-03-05 NOTE — Assessment & Plan Note (Signed)
The patient still has some residual deficits from her right hemispheric stroke few weeks ago.  Her left hand strength has returned but she still has coordination and fine motor issues as well as some sensory issues.  At this point, we need to consider treatment for carotid disease to avoid recurrent stroke.  Her renal function precluded CT angiogram.  At this point, a catheter-based angiogram will be planned.  This will help delineate the anatomy to determine the best treatment options going forward.  Given her significant renal dysfunction, a separate diagnostic carotid angiogram will be planned at this time.  Risks and benefits were discussed with the patient and she is agreeable to proceed. 

## 2020-03-05 NOTE — Assessment & Plan Note (Signed)
blood glucose control important in reducing the progression of atherosclerotic disease. Also, involved in wound healing. On appropriate medications.  

## 2020-03-05 NOTE — Progress Notes (Signed)
  MRN : [id number]  Danielle Paul is a 64 y.o. (11/10/1955) female who presents with chief complaint of  Chief Complaint  Patient presents with  . Follow-up    Discuss carotid angio  .  History of Present Illness: Patient returns today in follow up of carotid disease.  She was seen in the hospital few weeks ago with right hemispheric stroke with left arm weakness and numbness.  This has improved but not entirely resolved.  She seems to have return of her strength, but still has fine motor deficits in the left hand, coordination issues, and sensation issues.  She denies any new symptoms since that time.  She is on appropriate medical therapy with aspirin, Plavix, and Crestor.  A carotid duplex that suggested high-grade stenosis, but a CT angiogram could not be done due to renal dysfunction while she was in the hospital.  Current Outpatient Medications  Medication Sig Dispense Refill  . aspirin 81 MG EC tablet Take 81 mg by mouth daily.    . carvedilol (COREG) 25 MG tablet Take 25 mg by mouth 2 (two) times daily.    . clopidogrel (PLAVIX) 75 MG tablet Take 1 tablet (75 mg total) by mouth daily. 30 tablet 0  . FLUoxetine (PROZAC) 10 MG tablet Take 10 mg by mouth daily.    . insulin lispro (HUMALOG) 100 UNIT/ML injection Inject 3-9 Units into the skin 3 (three) times daily before meals. (based upon blood glucose reading)    . LANTUS SOLOSTAR 100 UNIT/ML Solostar Pen Inject 30 Units into the skin at bedtime.    . polyethylene glycol (MIRALAX / GLYCOLAX) 17 g packet Take 17 g by mouth daily. 14 each 0  . rosuvastatin (CRESTOR) 40 MG tablet Take 40 mg by mouth daily.    . FLUoxetine (PROZAC) 10 MG tablet Take 10 mg by mouth daily.     No current facility-administered medications for this visit.    Past Medical History:  Diagnosis Date  . Diabetes mellitus without complication (HCC)   . Hypercholesteremia   . Hypertension     Past Surgical History:  Procedure Laterality Date  . AORTIC  VALVE REPLACEMENT (AVR)/CORONARY ARTERY BYPASS GRAFTING (CABG)  2009     Social History   Tobacco Use  . Smoking status: Never Smoker  . Smokeless tobacco: Never Used  Substance Use Topics  . Alcohol use: Never  . Drug use: Never      Family History  Problem Relation Age of Onset  . Other Mother   . Hypertension Mother   . Hypertension Father   No bleeding or clotting disorders  Allergies  Allergen Reactions  . Atorvastatin Other (See Comments)    Muscle Pains  . Isosorbide Nitrate Other (See Comments)    Jittery and lightheadedness.  . Pravastatin Other (See Comments)  . Simvastatin Other (See Comments)    Muscle Pains     REVIEW OF SYSTEMS (Negative unless checked)  Constitutional: []Weight loss  []Fever  []Chills Cardiac: []Chest pain   []Chest pressure   []Palpitations   []Shortness of breath when laying flat   []Shortness of breath at rest   []Shortness of breath with exertion. Vascular:  []Pain in legs with walking   []Pain in legs at rest   []Pain in legs when laying flat   []Claudication   []Pain in feet when walking  []Pain in feet at rest  []Pain in feet when laying flat   []History of DVT   []Phlebitis   []Swelling   in legs   []Varicose veins   []Non-healing ulcers Pulmonary:   []Uses home oxygen   []Productive cough   []Hemoptysis   []Wheeze  []COPD   []Asthma Neurologic:  []Dizziness  []Blackouts   []Seizures   [x]History of stroke   [x]History of TIA  []Aphasia   []Temporary blindness   []Dysphagia   [x]Weakness or numbness in arms   []Weakness or numbness in legs Musculoskeletal:  [x]Arthritis   []Joint swelling   [x]Joint pain   []Low back pain Hematologic:  []Easy bruising  []Easy bleeding   []Hypercoagulable state   []Anemic   Gastrointestinal:  []Blood in stool   []Vomiting blood  []Gastroesophageal reflux/heartburn   []Abdominal pain Genitourinary:  [x]Chronic kidney disease   []Difficult urination  []Frequent urination  []Burning with urination    []Hematuria Skin:  []Rashes   []Ulcers   []Wounds Psychological:  []History of anxiety   [] History of major depression.  Physical Examination  BP (!) 190/93 (BP Location: Right Arm)   Pulse 65   Resp 16   Wt 166 lb 12.8 oz (75.7 kg)   BMI 31.52 kg/m  Gen:  WD/WN, NAD Head: [place]/AT, No temporalis wasting. Ear/Nose/Throat: Hearing grossly intact, nares w/o erythema or drainage Eyes: Conjunctiva clear. Sclera non-icteric Neck: Supple.  Trachea midline Pulmonary:  Good air movement, no use of accessory muscles.  Cardiac: RRR, no JVD Vascular:  Vessel Right Left  Radial Palpable Palpable           Musculoskeletal: M/S 5/5 throughout.  No deformity or atrophy.  Trace left lower extremity edema.  She does have some fine motor deficits in the left hand although her strength has entirely returned Neurologic: Sensation grossly intact in extremities.  Symmetrical.  Speech is somewhat slow and she does do some word searching..  Psychiatric: Judgment intact, Mood & affect appropriate for pt's clinical situation. Dermatologic: No rashes or ulcers noted.  No cellulitis or open wounds.       Labs Recent Results (from the past 2160 hour(s))  Protime-INR     Status: None   Collection Time: 02/01/20 11:44 PM  Result Value Ref Range   Prothrombin Time 13.1 11.4 - 15.2 seconds   INR 1.0 0.8 - 1.2    Comment: (NOTE) INR goal varies based on device and disease states. Performed at [facility], [address] [place], [place] [address]   APTT     Status: None   Collection Time: 02/01/20 11:44 PM  Result Value Ref Range   aPTT 31 24 - 36 seconds    Comment: Performed at [facility], [address] [place], [place] [address]  CBC     Status: Abnormal   Collection Time: 02/01/20 11:44 PM  Result Value Ref Range   WBC 6.3 4.0 - 10.5 K/uL   RBC 4.79 3.87 - 5.11 MIL/uL   Hemoglobin 12.6 12.0 - 15.0 g/dL   HCT 38.1 36.0 - 46.0 %   MCV 79.5 (L) 80.0 - 100.0 fL   MCH  26.3 26.0 - 34.0 pg   MCHC 33.1 30.0 - 36.0 g/dL   RDW 12.6 11.5 - 15.5 %   Platelets 331 150 - 400 K/uL   nRBC 0.0 0.0 - 0.2 %    Comment: Performed at [facility], [address] , [place] [address]  Differential     Status: None   Collection Time: 02/01/20 11:44 PM  Result Value Ref Range   Neutrophils Relative % 63 %     Neutro Abs 3.9 1.7 - 7.7 K/uL   Lymphocytes Relative 25 %   Lymphs Abs 1.6 0.7 - 4.0 K/uL   Monocytes Relative 10 %   Monocytes Absolute 0.6 0.1 - 1.0 K/uL   Eosinophils Relative 1 %   Eosinophils Absolute 0.1 0.0 - 0.5 K/uL   Basophils Relative 1 %   Basophils Absolute 0.1 0.0 - 0.1 K/uL   Immature Granulocytes 0 %   Abs Immature Granulocytes 0.01 0.00 - 0.07 K/uL    Comment: Performed at [facility], [address] [place], [place] [address]  Comprehensive metabolic panel     Status: Abnormal   Collection Time: 02/01/20 11:44 PM  Result Value Ref Range   Sodium 135 135 - 145 mmol/L   Potassium 4.5 3.5 - 5.1 mmol/L   Chloride 99 98 - 111 mmol/L   CO2 25 22 - 32 mmol/L   Glucose, Bld 155 (H) 70 - 99 mg/dL    Comment: Glucose reference range applies only to samples taken after fasting for at least 8 hours.   BUN 39 (H) 8 - 23 mg/dL   Creatinine, Ser 2.42 (H) 0.44 - 1.00 mg/dL   Calcium 9.4 8.9 - 10.3 mg/dL   Total Protein 8.0 6.5 - 8.1 g/dL   Albumin 4.1 3.5 - 5.0 g/dL   AST 23 15 - 41 U/L   ALT 21 0 - 44 U/L   Alkaline Phosphatase 76 38 - 126 U/L   Total Bilirubin 0.5 0.3 - 1.2 mg/dL   GFR, Estimated 22 (L) >60 mL/min    Comment: (NOTE) Calculated using the CKD-EPI Creatinine Equation (2021)    Anion gap 11 5 - 15    Comment: Performed at [facility], [address] [place], [place] [address]  Resp Panel by RT-PCR (Flu A&B, Covid) Nasopharyngeal Swab     Status: None   Collection Time: 02/01/20 11:44 PM   Specimen: Nasopharyngeal Swab; Nasopharyngeal(NP) swabs in vial transport medium  Result Value Ref  Range   SARS Coronavirus 2 by RT PCR NEGATIVE NEGATIVE    Comment: (NOTE) SARS-CoV-2 target nucleic acids are NOT DETECTED.  The SARS-CoV-2 RNA is generally detectable in upper respiratory specimens during the acute phase of infection. The lowest concentration of SARS-CoV-2 viral copies this assay can detect is 138 copies/mL. A negative result does not preclude SARS-Cov-2 infection and should not be used as the sole basis for treatment or other patient management decisions. A negative result may occur with  improper specimen collection/handling, submission of specimen other than nasopharyngeal swab, presence of viral mutation(s) within the areas targeted by this assay, and inadequate number of viral copies(<138 copies/mL). A negative result must be combined with clinical observations, patient history, and epidemiological information. The expected result is Negative.  Fact Sheet for Patients:  https://www.fda.gov/media/152166/download  Fact Sheet for Healthcare Providers:  https://www.fda.gov/media/152162/download  This test is no t yet approved or cleared by the [place] FDA and  has been authorized for detection and/or diagnosis of SARS-CoV-2 by FDA under an Emergency Use Authorization (EUA). This EUA will remain  in effect (meaning this test can be used) for the duration of the COVID-19 declaration under Section 564(b)(1) of the Act, 21 U.S.C.section 360bbb-3(b)(1), unless the authorization is terminated  or revoked sooner.       Influenza A by PCR NEGATIVE NEGATIVE   Influenza B by PCR NEGATIVE NEGATIVE    Comment: (NOTE) The Xpert Xpress SARS-CoV-2/FLU/RSV plus assay is intended as an aid in   the diagnosis of influenza from Nasopharyngeal swab specimens and should not be used as a sole basis for treatment. Nasal washings and aspirates are unacceptable for Xpert Xpress SARS-CoV-2/FLU/RSV testing.  Fact Sheet for  Patients: https://www.fda.gov/media/152166/download  Fact Sheet for Healthcare Providers: https://www.fda.gov/media/152162/download  This test is not yet approved or cleared by the [place] FDA and has been authorized for detection and/or diagnosis of SARS-CoV-2 by FDA under an Emergency Use Authorization (EUA). This EUA will remain in effect (meaning this test can be used) for the duration of the COVID-19 declaration under Section 564(b)(1) of the Act, 21 U.S.C. section 360bbb-3(b)(1), unless the authorization is terminated or revoked.  Performed at [facility], [address] [place], [place] [address]   Hemoglobin A1c     Status: Abnormal   Collection Time: 02/01/20 11:44 PM  Result Value Ref Range   Hgb A1c MFr Bld 9.4 (H) 4.8 - 5.6 %    Comment: (NOTE) Pre diabetes:          5.7%-6.4%  Diabetes:              >6.4%  Glycemic control for   <7.0% adults with diabetes    Mean Plasma Glucose 223.08 mg/dL    Comment: Performed at [facility], [address] [place], [place] [address]  Troponin I (High Sensitivity)     Status: None   Collection Time: 02/02/20  9:43 AM  Result Value Ref Range   Troponin I (High Sensitivity) 7 <18 ng/L    Comment: (NOTE) Elevated high sensitivity troponin I (hsTnI) values and significant  changes across serial measurements may suggest ACS but many other  chronic and acute conditions are known to elevate hsTnI results.  Refer to the "Links" section for chest pain algorithms and additional  guidance. Performed at [facility], [address] [place], [place] [address]   Urinalysis, Complete w Microscopic     Status: Abnormal   Collection Time: 02/02/20  9:43 AM  Result Value Ref Range   Color, Urine YELLOW (A) YELLOW   APPearance HAZY (A) CLEAR   Specific Gravity, Urine 1.009 1.005 - 1.030   pH 6.0 5.0 - 8.0   Glucose, UA 50 (A) NEGATIVE mg/dL   Hgb urine dipstick SMALL (A) NEGATIVE   Bilirubin Urine  NEGATIVE NEGATIVE   Ketones, ur NEGATIVE NEGATIVE mg/dL   Protein, ur NEGATIVE NEGATIVE mg/dL   Nitrite POSITIVE (A) NEGATIVE   Leukocytes,Ua LARGE (A) NEGATIVE   RBC / HPF 6-10 0 - 5 RBC/hpf   WBC, UA 21-50 0 - 5 WBC/hpf   Bacteria, UA FEW (A) NONE SEEN   Squamous Epithelial / LPF 0-5 0 - 5   Mucus PRESENT     Comment: Performed at [facility], [address] [place], [place] [address]  HIV Antibody (routine testing w rflx)     Status: None   Collection Time: 02/02/20 12:10 PM  Result Value Ref Range   HIV Screen 4th Generation wRfx Non Reactive Non Reactive    Comment: Performed at [facility], [address] [place], [place] [address]  CBG monitoring, ED     Status: Abnormal   Collection Time: 02/02/20 12:21 PM  Result Value Ref Range   Glucose-Capillary 122 (H) 70 - 99 mg/dL    Comment: Glucose reference range applies only to samples taken after fasting for at least 8 hours.  Glucose, capillary     Status: Abnormal   Collection Time: 02/02/20    4:08 PM  Result Value Ref Range   Glucose-Capillary 135 (H) 70 - 99 mg/dL    Comment: Glucose reference range applies only to samples taken after fasting for at least 8 hours.  Glucose, capillary     Status: Abnormal   Collection Time: 02/02/20  8:55 PM  Result Value Ref Range   Glucose-Capillary 104 (H) 70 - 99 mg/dL    Comment: Glucose reference range applies only to samples taken after fasting for at least 8 hours.  Glucose, capillary     Status: Abnormal   Collection Time: 02/03/20 12:52 AM  Result Value Ref Range   Glucose-Capillary 111 (H) 70 - 99 mg/dL    Comment: Glucose reference range applies only to samples taken after fasting for at least 8 hours.  Glucose, capillary     Status: Abnormal   Collection Time: 02/03/20  3:40 AM  Result Value Ref Range   Glucose-Capillary 105 (H) 70 - 99 mg/dL    Comment: Glucose reference range applies only to samples taken after fasting for at least 8 hours.  Hemoglobin  A1c     Status: Abnormal   Collection Time: 02/03/20  3:48 AM  Result Value Ref Range   Hgb A1c MFr Bld 9.4 (H) 4.8 - 5.6 %    Comment: (NOTE) Pre diabetes:          5.7%-6.4%  Diabetes:              >6.4%  Glycemic control for   <7.0% adults with diabetes    Mean Plasma Glucose 223.08 mg/dL    Comment: Performed at [facility], [address] [place], [place] [address]  Lipid panel     Status: Abnormal   Collection Time: 02/03/20  3:48 AM  Result Value Ref Range   Cholesterol 150 0 - 200 mg/dL   Triglycerides 100 <150 mg/dL   HDL 39 (L) >40 mg/dL   Total CHOL/HDL Ratio 3.8 RATIO   VLDL 20 0 - 40 mg/dL   LDL Cholesterol 91 0 - 99 mg/dL    Comment:        Total Cholesterol/HDL:CHD Risk Coronary Heart Disease Risk Table                     Men   Women  1/2 Average Risk   3.4   3.3  Average Risk       5.0   4.4  2 X Average Risk   9.6   7.1  3 X Average Risk  23.4   11.0        Use the calculated Patient Ratio above and the CHD Risk Table to determine the patient's CHD Risk.        ATP III CLASSIFICATION (LDL):  <100     mg/dL   Optimal  100-129  mg/dL   Near or Above                    Optimal  130-159  mg/dL   Borderline  160-189  mg/dL   High  >190     mg/dL   Very High Performed at [facility], [address] [place], [place] [address]   Glucose, capillary     Status: Abnormal   Collection Time: 02/03/20  8:33 AM  Result Value Ref Range   Glucose-Capillary 108 (H) 70 - 99 mg/dL    Comment: Glucose reference range applies only to samples taken after fasting for at   least 8 hours.  ECHOCARDIOGRAM COMPLETE     Status: None   Collection Time: 02/03/20 10:22 AM  Result Value Ref Range   BP 141/71 mmHg   Ao pk vel 1.36 m/s   AR max vel 1.56 cm2   AV Peak grad 7.4 mmHg   S' Lateral 2.85 cm   Area-P 1/2 4.17 cm2  Glucose, capillary     Status: Abnormal   Collection Time: 02/03/20 12:11 PM  Result Value Ref Range   Glucose-Capillary 190 (H) 70  - 99 mg/dL    Comment: Glucose reference range applies only to samples taken after fasting for at least 8 hours.  Glucose, capillary     Status: Abnormal   Collection Time: 02/03/20  4:20 PM  Result Value Ref Range   Glucose-Capillary 136 (H) 70 - 99 mg/dL    Comment: Glucose reference range applies only to samples taken after fasting for at least 8 hours.  Urine Culture     Status: None   Collection Time: 02/03/20  6:55 PM   Specimen: Urine, Random  Result Value Ref Range   Specimen Description      URINE, RANDOM Performed at [facility], [address] [place], [place] [address]    Special Requests      NONE Performed at [facility], [address] [place], [place] [address]    Culture      NO GROWTH Performed at [facility], [address] [place],  [address]    Report Status 02/05/2020 FINAL   Glucose, capillary     Status: Abnormal   Collection Time: 02/03/20  7:47 PM  Result Value Ref Range   Glucose-Capillary 122 (H) 70 - 99 mg/dL    Comment: Glucose reference range applies only to samples taken after fasting for at least 8 hours.  Glucose, capillary     Status: Abnormal   Collection Time: 02/04/20 12:01 AM  Result Value Ref Range   Glucose-Capillary 124 (H) 70 - 99 mg/dL    Comment: Glucose reference range applies only to samples taken after fasting for at least 8 hours.  Glucose, capillary     Status: None   Collection Time: 02/04/20  3:58 AM  Result Value Ref Range   Glucose-Capillary 81 70 - 99 mg/dL    Comment: Glucose reference range applies only to samples taken after fasting for at least 8 hours.  CBC with Differential/Platelet     Status: Abnormal   Collection Time: 02/04/20  5:24 AM  Result Value Ref Range   WBC 5.8 4.0 - 10.5 K/uL   RBC 4.25 3.87 - 5.11 MIL/uL   Hemoglobin 11.1 (L) 12.0 - 15.0 g/dL   HCT 34.1 (L) 36.0 - 46.0 %   MCV 80.2 80.0 - 100.0 fL   MCH 26.1 26.0 - 34.0 pg   MCHC 32.6 30.0 - 36.0 g/dL    RDW 13.1 11.5 - 15.5 %   Platelets 289 150 - 400 K/uL   nRBC 0.0 0.0 - 0.2 %   Neutrophils Relative % 50 %   Neutro Abs 3.0 1.7 - 7.7 K/uL   Lymphocytes Relative 37 %   Lymphs Abs 2.1 0.7 - 4.0 K/uL   Monocytes Relative 10 %   Monocytes Absolute 0.6 0.1 - 1.0 K/uL   Eosinophils Relative 2 %   Eosinophils Absolute 0.1 0.0 - 0.5 K/uL   Basophils Relative 1 %   Basophils Absolute 0.0 0.0 - 0.1 K/uL   Immature   Granulocytes 0 %   Abs Immature Granulocytes 0.02 0.00 - 0.07 K/uL    Comment: Performed at [facility], [address] [place], [place] [address]  Basic metabolic panel     Status: Abnormal   Collection Time: 02/04/20  5:24 AM  Result Value Ref Range   Sodium 133 (L) 135 - 145 mmol/L   Potassium 4.1 3.5 - 5.1 mmol/L   Chloride 104 98 - 111 mmol/L   CO2 21 (L) 22 - 32 mmol/L   Glucose, Bld 99 70 - 99 mg/dL    Comment: Glucose reference range applies only to samples taken after fasting for at least 8 hours.   BUN 37 (H) 8 - 23 mg/dL   Creatinine, Ser 1.63 (H) 0.44 - 1.00 mg/dL   Calcium 8.7 (L) 8.9 - 10.3 mg/dL   GFR, Estimated 35 (L) >60 mL/min    Comment: (NOTE) Calculated using the CKD-EPI Creatinine Equation (2021)    Anion gap 8 5 - 15    Comment: Performed at [facility], [address] [place], [place] [address]  Glucose, capillary     Status: None   Collection Time: 02/04/20  7:29 AM  Result Value Ref Range   Glucose-Capillary 97 70 - 99 mg/dL    Comment: Glucose reference range applies only to samples taken after fasting for at least 8 hours.  Glucose, capillary     Status: Abnormal   Collection Time: 02/04/20 11:15 AM  Result Value Ref Range   Glucose-Capillary 122 (H) 70 - 99 mg/dL    Comment: Glucose reference range applies only to samples taken after fasting for at least 8 hours.  Glucose, capillary     Status: Abnormal   Collection Time: 02/04/20  3:56 PM  Result Value Ref Range   Glucose-Capillary 117 (H) 70 - 99 mg/dL     Comment: Glucose reference range applies only to samples taken after fasting for at least 8 hours.  Glucose, capillary     Status: Abnormal   Collection Time: 02/04/20  9:12 PM  Result Value Ref Range   Glucose-Capillary 100 (H) 70 - 99 mg/dL    Comment: Glucose reference range applies only to samples taken after fasting for at least 8 hours.   Comment 1 Notify RN    Comment 2 Document in Chart   Glucose, capillary     Status: Abnormal   Collection Time: 02/05/20  4:05 AM  Result Value Ref Range   Glucose-Capillary 111 (H) 70 - 99 mg/dL    Comment: Glucose reference range applies only to samples taken after fasting for at least 8 hours.  Basic metabolic panel     Status: Abnormal   Collection Time: 02/05/20  4:21 AM  Result Value Ref Range   Sodium 134 (L) 135 - 145 mmol/L   Potassium 4.2 3.5 - 5.1 mmol/L   Chloride 106 98 - 111 mmol/L   CO2 20 (L) 22 - 32 mmol/L   Glucose, Bld 113 (H) 70 - 99 mg/dL    Comment: Glucose reference range applies only to samples taken after fasting for at least 8 hours.   BUN 41 (H) 8 - 23 mg/dL   Creatinine, Ser 1.71 (H) 0.44 - 1.00 mg/dL   Calcium 8.5 (L) 8.9 - 10.3 mg/dL   GFR, Estimated 33 (L) >60 mL/min    Comment: (NOTE) Calculated using the CKD-EPI Creatinine Equation (2021)    Anion gap 8 5 - 15    Comment: Performed at   [facility], [address] [place], [place] [address]  Glucose, capillary     Status: Abnormal   Collection Time: 02/05/20  7:30 AM  Result Value Ref Range   Glucose-Capillary 109 (H) 70 - 99 mg/dL    Comment: Glucose reference range applies only to samples taken after fasting for at least 8 hours.  Glucose, capillary     Status: Abnormal   Collection Time: 02/05/20 11:36 AM  Result Value Ref Range   Glucose-Capillary 177 (H) 70 - 99 mg/dL    Comment: Glucose reference range applies only to samples taken after fasting for at least 8 hours.  Glucose, capillary     Status: Abnormal   Collection Time: 02/05/20   3:29 PM  Result Value Ref Range   Glucose-Capillary 134 (H) 70 - 99 mg/dL    Comment: Glucose reference range applies only to samples taken after fasting for at least 8 hours.  Glucose, capillary     Status: Abnormal   Collection Time: 02/06/20 12:15 AM  Result Value Ref Range   Glucose-Capillary 118 (H) 70 - 99 mg/dL    Comment: Glucose reference range applies only to samples taken after fasting for at least 8 hours.  Basic metabolic panel     Status: Abnormal   Collection Time: 02/06/20  6:01 AM  Result Value Ref Range   Sodium 134 (L) 135 - 145 mmol/L   Potassium 4.7 3.5 - 5.1 mmol/L   Chloride 105 98 - 111 mmol/L   CO2 22 22 - 32 mmol/L   Glucose, Bld 107 (H) 70 - 99 mg/dL    Comment: Glucose reference range applies only to samples taken after fasting for at least 8 hours.   BUN 38 (H) 8 - 23 mg/dL   Creatinine, Ser 1.56 (H) 0.44 - 1.00 mg/dL   Calcium 8.9 8.9 - 10.3 mg/dL   GFR, Estimated 37 (L) >60 mL/min    Comment: (NOTE) Calculated using the CKD-EPI Creatinine Equation (2021)    Anion gap 7 5 - 15    Comment: Performed at [facility], [address] [place],  [address]  Glucose, capillary     Status: Abnormal   Collection Time: 02/06/20  8:45 AM  Result Value Ref Range   Glucose-Capillary 129 (H) 70 - 99 mg/dL    Comment: Glucose reference range applies only to samples taken after fasting for at least 8 hours.  Glucose, capillary     Status: Abnormal   Collection Time: 02/06/20 12:28 PM  Result Value Ref Range   Glucose-Capillary 119 (H) 70 - 99 mg/dL    Comment: Glucose reference range applies only to samples taken after fasting for at least 8 hours.    Radiology ECHOCARDIOGRAM LIMITED BUBBLE STUDY  Result Date: 02/06/2020    ECHOCARDIOGRAM LIMITED REPORT   Patient Name:   Masiah Bernat Date of Exam: 02/05/2020 Medical Rec #:  [id number]   Height:       61.0 in Accession #:    [id number]  Weight:       165.0 lb Date of Birth:  09/28/1955  BSA:           1.740 m Patient Age:    64 years    BP:           147/56 mmHg Patient Gender: F           HR:           66 bpm. Exam Location:  [facility] Procedure:   Saline Contrast Bubble Study and Limited Echo Indications:     I63.9 Stroke  History:         Patient has prior history of Echocardiogram examinations, most                  recent 02/03/2020. Risk Factors:Hypertension, Diabetes and HCL.  Sonographer:     Joan Heiss RDCS (AE) Referring Phys:  [id number] SALMAN KHALIQDINA Diagnosing Phys: Shaukat Khan MD  FINDINGS  IAS/Shunts: No atrial level shunt detected by color flow Doppler. Agitated saline contrast was given intravenously to evaluate for intracardiac shunting. No ventricular septal defect is seen or detected. There is no evidence of an atrial septal defect. Shaukat Khan MD Electronically signed by Shaukat Khan MD Signature Date/Time: 02/06/2020/8:36:02 AM    Final     Assessment/Plan  AKI (acute kidney injury) (HCC) With baseline chronic kidney disease.  CT scan was not possible due to her poor renal function.  CVA (cerebral vascular accident) (HCC) The patient still has some residual deficits from her right hemispheric stroke few weeks ago.  Her left hand strength has returned but she still has coordination and fine motor issues as well as some sensory issues.  At this point, we need to consider treatment for carotid disease to avoid recurrent stroke.  Her renal function precluded CT angiogram.  At this point, a catheter-based angiogram will be planned.  This will help delineate the anatomy to determine the best treatment options going forward.  Given her significant renal dysfunction, a separate diagnostic carotid angiogram will be planned at this time.  Risks and benefits were discussed with the patient and she is agreeable to proceed.  Carotid stenosis The patient still has some residual deficits from her right hemispheric stroke few weeks ago.  Her left hand strength has returned but she still has  coordination and fine motor issues as well as some sensory issues.  At this point, we need to consider treatment for carotid disease to avoid recurrent stroke.  Her renal function precluded CT angiogram.  At this point, a catheter-based angiogram will be planned.  This will help delineate the anatomy to determine the best treatment options going forward.  Given her significant renal dysfunction, a separate diagnostic carotid angiogram will be planned at this time.  Risks and benefits were discussed with the patient and she is agreeable to proceed.  Hypertension blood pressure control important in reducing the progression of atherosclerotic disease. On appropriate oral medications.   Diabetes mellitus without complication (HCC) blood glucose control important in reducing the progression of atherosclerotic disease. Also, involved in wound healing. On appropriate medications.     Judith Demps, MD  03/05/2020 11:55 AM    This note was created with Dragon medical transcription system.  Any errors from dictation are purely unintentional 

## 2020-03-07 ENCOUNTER — Telehealth (INDEPENDENT_AMBULATORY_CARE_PROVIDER_SITE_OTHER): Payer: Self-pay

## 2020-03-07 NOTE — Telephone Encounter (Signed)
Spoke with the patient and she is now scheduled with Dr. Wyn Quaker for a carotid angio on 03/21/20 with a 8:00 am arrival time to the [facility]. Covid testing on 03/19/20 between 8-1 pm at the [facility]. Pre-procedure instructions were discussed and will be mailed.

## 2020-03-07 NOTE — Telephone Encounter (Signed)
I attempted to contact the patient regarding being scheduled for a carotid angio and a message was left for a return call.

## 2020-03-18 ENCOUNTER — Telehealth (INDEPENDENT_AMBULATORY_CARE_PROVIDER_SITE_OTHER): Payer: Self-pay

## 2020-03-18 ENCOUNTER — Other Ambulatory Visit
Admission: RE | Admit: 2020-03-18 | Discharge: 2020-03-18 | Disposition: A | Discharge status: Home / Self Care | Payer: BLUE CROSS/BLUE SHIELD | Source: Ambulatory Visit | Attending: Vascular Surgery | Admitting: Vascular Surgery

## 2020-03-18 ENCOUNTER — Other Ambulatory Visit: Payer: Self-pay

## 2020-03-18 DIAGNOSIS — Z20822 Contact with and (suspected) exposure to covid-19: Secondary | ICD-10-CM | POA: Diagnosis not present

## 2020-03-18 DIAGNOSIS — Z01812 Encounter for preprocedural laboratory examination: Secondary | ICD-10-CM | POA: Insufficient documentation

## 2020-03-18 NOTE — Telephone Encounter (Signed)
Patient called and left a message for a return call. I returned the call and a message was left for a return call.

## 2020-03-19 LAB — SARS CORONAVIRUS 2 (TAT 6-24 HRS): SARS Coronavirus 2: NEGATIVE

## 2020-03-21 ENCOUNTER — Ambulatory Visit
Admission: RE | Admit: 2020-03-21 | Discharge: 2020-03-21 | Disposition: A | Discharge status: Home / Self Care | Payer: BLUE CROSS/BLUE SHIELD | Attending: Vascular Surgery | Admitting: Vascular Surgery

## 2020-03-21 ENCOUNTER — Other Ambulatory Visit (INDEPENDENT_AMBULATORY_CARE_PROVIDER_SITE_OTHER): Payer: Self-pay | Admitting: Nurse Practitioner

## 2020-03-21 ENCOUNTER — Encounter
Admission: RE | Disposition: A | Discharge status: Home / Self Care | Payer: Self-pay | Source: Home / Self Care | Attending: Vascular Surgery

## 2020-03-21 ENCOUNTER — Other Ambulatory Visit: Payer: Self-pay

## 2020-03-21 ENCOUNTER — Encounter: Payer: Self-pay | Admitting: Vascular Surgery

## 2020-03-21 DIAGNOSIS — Z794 Long term (current) use of insulin: Secondary | ICD-10-CM | POA: Diagnosis not present

## 2020-03-21 DIAGNOSIS — E1122 Type 2 diabetes mellitus with diabetic chronic kidney disease: Secondary | ICD-10-CM | POA: Diagnosis not present

## 2020-03-21 DIAGNOSIS — Z7982 Long term (current) use of aspirin: Secondary | ICD-10-CM | POA: Diagnosis not present

## 2020-03-21 DIAGNOSIS — N189 Chronic kidney disease, unspecified: Secondary | ICD-10-CM | POA: Insufficient documentation

## 2020-03-21 DIAGNOSIS — I639 Cerebral infarction, unspecified: Secondary | ICD-10-CM | POA: Insufficient documentation

## 2020-03-21 DIAGNOSIS — Z7902 Long term (current) use of antithrombotics/antiplatelets: Secondary | ICD-10-CM | POA: Diagnosis not present

## 2020-03-21 DIAGNOSIS — Z888 Allergy status to other drugs, medicaments and biological substances status: Secondary | ICD-10-CM | POA: Diagnosis not present

## 2020-03-21 DIAGNOSIS — I6521 Occlusion and stenosis of right carotid artery: Secondary | ICD-10-CM | POA: Diagnosis not present

## 2020-03-21 DIAGNOSIS — I129 Hypertensive chronic kidney disease with stage 1 through stage 4 chronic kidney disease, or unspecified chronic kidney disease: Secondary | ICD-10-CM | POA: Insufficient documentation

## 2020-03-21 DIAGNOSIS — Z79899 Other long term (current) drug therapy: Secondary | ICD-10-CM | POA: Diagnosis not present

## 2020-03-21 DIAGNOSIS — I6529 Occlusion and stenosis of unspecified carotid artery: Secondary | ICD-10-CM

## 2020-03-21 DIAGNOSIS — I6523 Occlusion and stenosis of bilateral carotid arteries: Secondary | ICD-10-CM

## 2020-03-21 HISTORY — DX: Atherosclerotic heart disease of native coronary artery without angina pectoris: I25.10

## 2020-03-21 HISTORY — DX: Cerebral infarction, unspecified: I63.9

## 2020-03-21 HISTORY — DX: Anxiety disorder, unspecified: F41.9

## 2020-03-21 HISTORY — PX: CAROTID ANGIOGRAPHY: CATH118230

## 2020-03-21 HISTORY — DX: Nonrheumatic aortic valve disorder, unspecified: I35.9

## 2020-03-21 LAB — GLUCOSE, CAPILLARY: Glucose-Capillary: 129 mg/dL — ABNORMAL HIGH (ref 70–99)

## 2020-03-21 LAB — CREATININE, SERUM
Creatinine, Ser: 1.72 mg/dL — ABNORMAL HIGH (ref 0.44–1.00)
GFR, Estimated: 33 mL/min — ABNORMAL LOW (ref >60)

## 2020-03-21 LAB — BUN: BUN: 14 mg/dL (ref 8–23)

## 2020-03-21 SURGERY — CAROTID ANGIOGRAPHY
Anesthesia: Moderate Sedation

## 2020-03-21 MED ORDER — FAMOTIDINE 20 MG PO TABS: 40.0000 mg | ORAL_TABLET | Freq: Once | ORAL | Status: DC | PRN

## 2020-03-21 MED ORDER — METHYLPREDNISOLONE SODIUM SUCC 125 MG IJ SOLR: 125.0000 mg | Freq: Once | INTRAMUSCULAR | Status: DC | PRN

## 2020-03-21 MED ORDER — HEPARIN SODIUM (PORCINE) 1000 UNIT/ML IJ SOLN
INTRAMUSCULAR | Status: AC
  Administered 2020-03-21: 3000 [IU]
  Filled 2020-03-21: qty 1

## 2020-03-21 MED ORDER — CEFAZOLIN SODIUM-DEXTROSE 2-4 GM/100ML-% IV SOLN
INTRAVENOUS | Status: AC
  Filled 2020-03-21: qty 100

## 2020-03-21 MED ORDER — SODIUM CHLORIDE 0.9 % IV BOLUS: 250.0000 mL | Freq: Once | INTRAVENOUS | Status: DC

## 2020-03-21 MED ORDER — IODIXANOL 320 MG/ML IV SOLN
INTRAVENOUS | Status: DC | PRN
  Administered 2020-03-21: 65 mL

## 2020-03-21 MED ORDER — SODIUM CHLORIDE 0.9 % IV SOLN: INTRAVENOUS | Status: DC

## 2020-03-21 MED ORDER — MIDAZOLAM HCL 5 MG/5ML IJ SOLN
INTRAMUSCULAR | Status: AC
  Administered 2020-03-21: 2 mg
  Filled 2020-03-21: qty 5

## 2020-03-21 MED ORDER — ONDANSETRON HCL 4 MG/2ML IJ SOLN: 4.0000 mg | Freq: Four times a day (QID) | INTRAMUSCULAR | Status: DC | PRN

## 2020-03-21 MED ORDER — CEFAZOLIN SODIUM-DEXTROSE 2-4 GM/100ML-% IV SOLN
2.0000 g | Freq: Once | INTRAVENOUS | Status: AC
  Administered 2020-03-21: 2 g via INTRAVENOUS

## 2020-03-21 MED ORDER — MIDAZOLAM HCL 2 MG/ML PO SYRP: 8.0000 mg | ORAL_SOLUTION | Freq: Once | ORAL | Status: DC | PRN

## 2020-03-21 MED ORDER — ATROPINE SULFATE 1 MG/10ML IJ SOSY
PREFILLED_SYRINGE | INTRAMUSCULAR | Status: AC
  Filled 2020-03-21: qty 10

## 2020-03-21 MED ORDER — HYDROMORPHONE HCL 1 MG/ML IJ SOLN: 1.0000 mg | Freq: Once | INTRAMUSCULAR | Status: DC | PRN

## 2020-03-21 MED ORDER — HYDRALAZINE HCL 20 MG/ML IJ SOLN
INTRAMUSCULAR | Status: AC
  Filled 2020-03-21: qty 1

## 2020-03-21 MED ORDER — HYDRALAZINE HCL 20 MG/ML IJ SOLN
10.0000 mg | Freq: Once | INTRAMUSCULAR | Status: AC
  Administered 2020-03-21: 10 mg via INTRAVENOUS

## 2020-03-21 MED ORDER — DIPHENHYDRAMINE HCL 50 MG/ML IJ SOLN: 50.0000 mg | Freq: Once | INTRAMUSCULAR | Status: DC | PRN

## 2020-03-21 MED ORDER — FENTANYL CITRATE (PF) 100 MCG/2ML IJ SOLN
INTRAMUSCULAR | Status: AC
  Administered 2020-03-21: 50 ug
  Filled 2020-03-21: qty 2

## 2020-03-21 SURGICAL SUPPLY — 11 items
CATH ANGIO 5F PIGTAIL 100CM (CATHETERS) ×2 IMPLANT
CATH BEACON 5 .035 100 H1 TIP (CATHETERS) ×2 IMPLANT
CATH BEACON 5 .035 100 JB2 TIP (CATHETERS) ×2 IMPLANT
DEVICE STARCLOSE SE CLOSURE (Vascular Products) ×2 IMPLANT
DEVICE TORQUE .025-.038 (MISCELLANEOUS) ×2 IMPLANT
GLIDEWIRE STIFF .35X180X3 HYDR (WIRE) ×2 IMPLANT
KIT CAROTID MANIFOLD (MISCELLANEOUS) ×2 IMPLANT
PACK ANGIOGRAPHY (CUSTOM PROCEDURE TRAY) ×2 IMPLANT
SHEATH BRITE TIP 5FRX11 (SHEATH) ×2 IMPLANT
SYR MEDRAD MARK 7 150ML (SYRINGE) ×2 IMPLANT
WIRE GUIDERIGHT .035X150 (WIRE) ×2 IMPLANT

## 2020-03-21 NOTE — Discharge Instructions (Signed)
Carotid Artery Disease  Carotid artery disease is the narrowing or blockage of one or both carotid arteries. This condition is also called carotid artery stenosis. The carotid arteries are the two main blood vessels on either side of the neck. They send blood to the brain, other parts of the head, and the neck.  This condition increases your risk for a stroke or a transient ischemic attack (TIA). A TIA is a "mini-stroke" that causes stroke-like symptoms that go away quickly. What are the causes? This condition is mainly caused by a narrowing and hardening of the carotid arteries. The carotid arteries can become narrow or clogged with a buildup of plaque. Plaque includes:  Fat.  Cholesterol.  Calcium.  Other substances. What increases the risk? The following factors may make you more likely to develop this condition:  Having certain medical conditions, such as: ? High cholesterol. ? High blood pressure. ? Diabetes. ? Obesity.  Smoking.  A family history of cardiovascular disease.  Not being active or lack of regular exercise.  Being female. Men have a higher risk of having arteries become narrow and harden earlier in life than women.  Old age. What are the signs or symptoms? This condition may not have any signs or symptoms until a stroke or TIA happens. In some cases, your doctor may be able to hear a whooshing sound. This can suggest a change in blood flow caused by plaque buildup. An eye exam can also help find signs of the condition. How is this treated? This condition may be treated with more than one treatment. Treatment options include:  Lifestyle changes, such as: ? Quitting smoking. ? Getting regular exercise, or getting exercise as told by your doctor. ? Eating a healthy diet. ? Managing stress. ? Keeping a healthy weight.  Medicines to control: ? Blood pressure. ? Cholesterol. ? Blood clotting.  Surgery. You may have: ? A surgery to remove the blockages in  the carotid arteries. ? A procedure in which a small mesh tube (stent) is used to widen the blocked carotid arteries. Follow these instructions at home: Eating and drinking Follow instructions about your diet from your doctor. It is important to follow a healthy diet.  Eat a diet that includes: ? A lot of fresh fruits and vegetables. ? Low-fat (lean) meats.  Avoid these foods: ? Foods that are high in fat. ? Foods that are high in salt (sodium). ? Foods that are fried. ? Foods that are processed. ? Foods that have few good nutrients (poor nutritional value).   Lifestyle  Keep a healthy weight.  Do exercises as told by your doctor to stay active. Each week, you should get one of the following: ? At least 150 minutes of exercise that raises your heart rate and makes you sweat (moderate-intensity exercise). ? At least 75 minutes of exercise that takes a lot of effort.  Do not use any products that contain nicotine or tobacco, such as cigarettes, e-cigarettes, and chewing tobacco. If you need help quitting, ask your doctor.  Do not drink alcohol if: ? Your doctor tells you not to drink. ? You are pregnant, may be pregnant, or are planning to become pregnant.  If you drink alcohol: ? Limit how much you use to:  0-1 drink a day for women.  0-2 drinks a day for men. ? Be aware of how much alcohol is in your drink. In the [place] one drink equals one 12 oz bottle of beer (355 mL), one 5   oz glass of wine (148 mL), or one 1 oz glass of hard liquor (44 mL).  Do not use drugs.  Manage your stress. Ask your doctor for tips on how to do this.   General instructions  Take over-the-counter and prescription medicines only as told by your doctor.  Keep all follow-up visits as told by your doctor. This is important. Where to find more information  American Heart Association: www.heart.org Get help right away if:  You have any signs of a stroke. "BE FAST" is an easy way to remember the  main warning signs: ? B - Balance. Signs are dizziness, sudden trouble walking, or loss of balance. ? E - Eyes. Signs are trouble seeing or a change in how you see. ? F - Face. Signs are sudden weakness or loss of feeling of the face, or the face or eyelid drooping on one side. ? A - Arms. Signs are weakness or loss of feeling in an arm. This happens suddenly and usually on one side of the body. ? S - Speech. Signs are sudden trouble speaking, slurred speech, or trouble understanding what people say. ? T - Time. Time to call emergency services. Write down what time symptoms started.  You have other signs of a stroke, such as: ? A sudden, very bad headache with no known cause. ? Feeling like you may vomit (nausea). ? Vomiting. ? A seizure. These symptoms may be an emergency. Do not wait to see if the symptoms will go away. Get medical help right away. Call your local emergency services (911 in the [place] Do not drive yourself to the hospital. Summary  The carotid arteries are blood vessels on both sides of the neck.  If these arteries get smaller or get blocked, you are more likely to have a stroke or a mini-stroke.  This condition can be treated with lifestyle changes, medicines, surgery, or a blend of these treatments.  Get help right away if you have any signs of a stroke. "BE FAST" is an easy way to remember the main warning signs of stroke. This information is not intended to replace advice given to you by your health care provider. Make sure you discuss any questions you have with your health care provider. Document Revised: 09/05/2018 Document Reviewed: 09/05/2018 Elsevier Patient Education  2021 [employer]   Carotid Artery Dissection  A carotid artery dissection is a tear in a carotid artery. The carotid arteries are blood vessels on each side of the neck. They carry blood from the heart to the brain and other parts of the head. When an artery tears, blood collects inside the  layers of the artery wall. This can cause a blood clot. This condition increases the risk of a stroke if it is not diagnosed and treated right away. What are the causes? This condition may be caused by having:  A neck injury due to sudden or excessive neck movement. This is called a traumatic dissection.  Weak blood vessel walls. The walls may tear even when no injury occurs (spontaneous dissection).  An angiogram procedure. This is a rare cause of carotid artery dissection. In many cases, the cause of this condition is not known. What increases the risk? Carotid artery dissection is more likely to develop in people who are 23-29 years old. You may be more likely to have a spontaneous carotid artery dissection if you have:  High blood pressure.  A migraine disorder.  Certain inherited diseases or connective tissue disorders that  weaken the blood vessels.  Fibromuscular dysplasia.  Had an artery dissection in the past.  Abused amphetamine drugs. What are the signs or symptoms? Symptoms of carotid artery dissection are similar to signs of a stroke. Symptoms may include:  Headache.  Eye, face, or neck pain.  Changes in vision.  Weakness on one side of the face or body.  Drooping eyelid.  Loss of taste.  Ringing in the ear. How is this diagnosed? This condition is diagnosed with tests, such as:  CT angiogram. This test uses a computer to take X-rays of your carotid arteries. A dye may be injected into your blood to show the inside of your blood vessels more clearly.  MRI angiogram. This is a type of MRI that is used to evaluate the blood vessels.  Cerebral angiogram. This test uses X-rays and a dye to show the blood vessels in the brain and neck.  Doppler ultrasound. This test uses sound waves to show the carotid arteries. The test will also show how well blood flows through your arteries. How is this treated? Treatment for this condition depends on the cause of your  carotid artery dissection and your overall health. The most important goal of treatment is to prevent a stroke. If you are having a stroke, it is important to get treatment quickly. The condition may be treated with:  Blood thinners. This medicine helps to prevent blood clots. This may be given first through an IV, and then as pills for 3-6 months.  Procedures to widen a narrow blood vessel (angioplasty) or to place a mesh tube (stent) inside the blood vessel to keep it open.  Surgery to repair the area. This is rarely needed. Follow these instructions at home: Lifestyle  Work with your health care provider to control your blood pressure. This may involve: ? Exercising regularly, as told by your health care provider. Check with your health care provider before starting any new type of exercise. ? Eating a heart-healthy diet. This includes limiting unhealthy fats and eating more healthy fats. ? Reducing the amount of salt (sodium) that you eat to less than 1,500 mg a day. ? Reducing stress by participating in things that you enjoy and avoiding things that cause you stress.  Avoid activities that put you at risk for neck injuries, such as contact sports.  Do not use any products that contain nicotine or tobacco, such as cigarettes, e-cigarettes, and chewing tobacco. If you need help quitting, ask your health care provider.   General instructions  Take over-the-counter and prescription medicines only as told by your health care provider.  Keep all follow-up visits as told by your health care provider. This is important. Get help right away if:  You feel weak or dizzy.  You have a sudden, severe headache with no known cause.  You notice changes in your vision or speech.  You have difficulty breathing.  You have a loss of balance or coordination.  You have numbness in your face, arm, or leg.  You have chest pain.  You have a fever. These symptoms may represent a serious problem  that is an emergency. Do not wait to see if the symptoms will go away. Get medical help right away. Call your local emergency services (911 in the [place] Do not drive yourself to the hospital. Summary  A carotid artery dissection is a tear in a carotid artery. When an artery tears, blood collects inside the layers of the artery wall. This can cause a  blood clot.  Treatment for this condition depends on the cause of your carotid artery dissection and your overall health. The most important goal of treatment is to prevent a stroke.  The condition may be treated with blood thinners, procedures to widen a narrow blood vessel, or surgery to repair the area.  Avoid activities that put you at risk for neck injuries, such as contact sports.  Take over-the-counter and prescription medicines only as told by your health care provider. This information is not intended to replace advice given to you by your health care provider. Make sure you discuss any questions you have with your health care provider. Document Revised: 10/15/2017 Document Reviewed: 10/15/2017 Elsevier Patient Education  2021 [employer] Inc.   Carotid Artery Disease  Carotid artery disease, also called carotid artery stenosis, is the narrowing or blockage of one or both carotid arteries. The carotid arteries are the two main blood vessels on either side of the neck. They supply blood to the brain, other parts of the head, and the neck. Carotid artery disease increases your risk for a stroke or a transient ischemic attack (TIA). A TIA is a "mini-stroke" that causes stroke-like symptoms that then go away quickly. What are the causes? This condition is mainly caused by a narrowing and hardening of the carotid arteries (atherosclerosis). The carotid arteries can become narrow or clogged with a buildup of fat, cholesterol, calcium, and other substances (plaque). What increases the risk? The following factors may make you more likely to  develop this condition:  Having certain medical conditions, such as: ? High cholesterol. ? High blood pressure (hypertension). ? Diabetes. ? Obesity.  Smoking.  A family history of cardiovascular disease.  Inactivity or lack of regular exercise.  Being female. Men have an increased risk of developing atherosclerosis earlier in life than women.  Old age. What are the signs or symptoms? This condition may not have any signs or symptoms until a stroke or TIA occurs. In some cases, your health care provider may be able to hear a whooshing sound (bruit). This can indicate a change in blood flow caused by plaque buildup. An eye exam can also help identify signs of the condition. How is this diagnosed? This condition may be diagnosed with a physical exam, your medical history, and your family's medical history. You may also have tests that look at the blood flow in your carotid arteries, such as:  Carotid artery ultrasound, which uses sound waves to create pictures to show if the arteries are narrow or blocked.  Tests that use a dye injected into a vein to highlight your arteries on images, such as: ? Carotid or cerebral angiography, which uses X-rays. ? Computerized tomographic angiography (CTA), which uses CT scans. ? Magnetic resonance angiography (MRA), which uses MRI. How is this treated? This condition may be treated with a combination of treatments. Treatment options include:  Lifestyle changes, such as: ? Quitting smoking. ? Exercising regularly or as told by your health care provider. ? Eating a heart-healthy diet. ? Managing stress. ? Maintaining a healthy weight.  Medicines to control blood pressure, cholesterol, and blood clotting.  Surgery. You may have: ? A carotid endarterectomy. This is a surgery to remove the blockages in the carotid arteries. ? A carotid angioplasty with stenting. This is a procedure in which a small mesh tube (stent) is used to widen the blocked  carotid arteries. Follow these instructions at home: Eating and drinking Follow instructions about your diet from your health care  provider. It is important to:  Eat a healthy diet that is low in saturated fats and includes plenty of fresh fruits, vegetables, and lean meats.  Avoid foods that are high in fat and salt (sodium).  Avoid foods that are fried, overly processed, or have poor nutritional value.   Lifestyle  Maintain a healthy weight.  Do exercises as told by your health care provider to stay physically active. It is recommended that each week you get at least 150 minutes of moderate-intensity exercise or 75 minutes of exercise that takes a lot of effort.  Do not use any products that contain nicotine or tobacco, such as cigarettes, e-cigarettes, and chewing tobacco. If you need help quitting, ask your health care provider.  Do not drink alcohol if: ? Your health care provider tells you not to drink. ? You are pregnant, may be pregnant, or are planning to become pregnant.  If you drink alcohol: ? Limit how much you use to:  0-1 drink a day for women.  0-2 drinks a day for men. ? Be aware of how much alcohol is in your drink. In the [place] one drink equals one 12 oz bottle of beer (355 mL), one 5 oz glass of wine (148 mL), or one 1 oz glass of hard liquor (44 mL).  Do not use drugs.  Manage your stress. Ask your health care provider for stress management tips.   General instructions  Take over-the-counter and prescription medicines only as told by your health care provider.  Keep all follow-up visits as told by your health care provider. This is important. Where to find more information  American Heart Association: www.heart.org Get help right away if:  You have any symptoms of a stroke. "BE FAST" is an easy way to remember the main warning signs of a stroke: ? B - Balance. Signs are dizziness, sudden trouble walking, or loss of balance. ? E - Eyes. Signs are  trouble seeing or a sudden change in vision. ? F - Face. Signs are sudden weakness or numbness of the face, or the face or eyelid drooping on one side. ? A - Arms. Signs are weakness or numbness in an arm. This happens suddenly and usually on one side of the body. ? S - Speech. Signs are sudden trouble speaking, slurred speech, or trouble understanding what people say. ? T - Time. Time to call emergency services. Write down what time symptoms started.  You have other signs of a stroke, such as: ? A sudden, severe headache with no known cause. ? Nausea or vomiting. ? Seizure. These symptoms may represent a serious problem that is an emergency. Do not wait to see if the symptoms will go away. Get medical help right away. Call your local emergency services (911 in the [place] Do not drive yourself to the hospital. Summary  Carotid artery disease, also called carotid artery stenosis, is the narrowing or blockage of one or both carotid arteries.  Carotid artery disease increases your risk for a stroke or a transient ischemic attack (TIA).  This condition can be treated with lifestyle changes, medicines, surgery, or a combination of these treatments.  Get help right away if you have any symptoms of stroke. The acronym BEFAST is an easy way to remember the main warning signs of stroke. This information is not intended to replace advice given to you by your health care provider. Make sure you discuss any questions you have with your health care provider. Document Revised: 08/22/2018  Document Reviewed: 08/22/2018 Elsevier Patient Education  2021 [employer].

## 2020-03-21 NOTE — Interval H&P Note (Signed)
History and Physical Interval Note:  03/21/2020 7:59 AM  Danielle Paul  has presented today for surgery, with the diagnosis of Carotid Angio    Carotid artery stenosis Covid Jan 25.  The various methods of treatment have been discussed with the patient and family. After consideration of risks, benefits and other options for treatment, the patient has consented to  Procedure(s): CAROTID ANGIOGRAPHY (N/A) as a surgical intervention.  The patient's history has been reviewed, patient examined, no change in status, stable for surgery.  I have reviewed the patient's chart and labs.  Questions were answered to the patient's satisfaction.     Festus Barren

## 2020-03-21 NOTE — Op Note (Signed)
[facility]   OPERATIVE NOTE  DATE: 03/21/2020  PRE-OPERATIVE DIAGNOSIS: 1.  Previous stroke with ultrasound suggesting high-grade right carotid artery stenosis 2. Chronic kidney disease precluding CT angiogram  POST-OPERATIVE DIAGNOSIS: Same as above, but right carotid artery was totally occluded in the proximal internal carotid artery with no significant distal reconstitution to consider revascularization  PROCEDURE: 1.   Ultrasound Guidance for vascular access right femoral artery 2.   Catheter placement into right common carotid artery and into left common carotid artery from right femoral approach 3.   Thoracic aortogram 4.   Cervical and cerebral bilateral carotid angiograms 5.   StarClose closure device right femoral artery  SURGEON: Festus Barren, MD  ASSISTANT(S): None  ANESTHESIA: Moderate conscious sedation  ESTIMATED BLOOD LOSS: 5 cc  FLUORO TIME: 3.5 minutes  CONTRAST: 65  MODERATE CONSCIOUS SEDATION TIME: Approximately 19 minutes using 2 of Versed and 50 mcg of Fentanyl  FINDING(S): 1.  Right internal carotid artery was occluded just beyond its origin with no significant distal reconstitution seen.  There was a large right external carotid artery.  No significant intracranial filling was seen on a selective right carotid injection. 2.  On the left side, intracranial filling was brisk and there was complete cross-filling from left to right including both the anterior and middle cerebral arteries filling from left to right without intracranial deficits. The left cervical carotid artery was mildly diseased in the 20 to 25% range.  SPECIMEN(S):  None  INDICATIONS:   Patient is a 64 y.o.female who presents with previous stroke and an ultrasound suggesting high-grade right carotid artery stenosis. The patient's renal function precluded CT angiogram. Catheter-based angiogram is performed for further evaluation. Risks and benefits are discussed and  informed consent was obtained.  DESCRIPTION: After obtaining full informed written consent, the patient was brought back to the operating room and placed supine upon the vascular suite table.  After obtaining adequate anesthesia, the patient was prepped and draped in the standard fashion.  Moderate conscious sedation was administered during a face to face encounter with the patient throughout the procedure with my supervision of the RN administering medicines and monitoring the patients vital signs and mental status throughout from the start of the procedure until the patient was taken to the recovery room. The right femoral artery was visualized with ultrasound and found to be calcific but patent. It was then accessed under direct ultrasound guidance without difficulty with a Seldinger needle. A J-wire and 5 French sheath were placed and a permanent image was recorded. The patient was given 3000 units of intravenous heparin. A pigtail catheter was placed into the ascending aorta and an LAO projection thoracic aortogram was performed. This showed a bovine configuration of the great vessels with no proximal stenosis in the great vessels.  I then selected a headhunter catheter and cannulated the innominate artery advancing into the mid right common carotid artery. Cervical and cerebral carotid angiography were then performed on the right side initially. The right internal carotid artery was occluded just beyond its origin with no significant distal reconstitution seen.  There was a large right external carotid artery.  No significant intracranial filling was seen on a selective right carotid injection. I then turned my attention back to the thoracic aorta and removed the catheter from the right side. I selectively cannulated the left common carotid artery without difficulty with a JB2 catheter and advanced into the mid left common carotid artery. Selective imaging was then performed of  the cervical and cerebral  carotid artery on the left. Intracranial filling was brisk and there was complete cross-filling from left to right including both the anterior and middle cerebral arteries filling from left to right without intracranial deficits. The cervical carotid artery was mildly diseased in the 20 to 25% range. Multiple views were taken in the cervical carotid artery bilaterally. At this point, we had imaging to plan our treatment and we elected to terminate the procedure. The diagnostic catheter was removed. Oblique arteriogram was performed of the right femoral artery and StarClose closure device was deployed in usual fashion with excellent hemostatic result. The patient tolerated the procedure well and was taken to the recovery room in stable condition.  COMPLICATIONS: None  CONDITION: Stable   Festus Barren 03/21/2020 9:52 AM   This note was created with Dragon Medical transcription system. Any errors in dictation are purely unintentional.

## 2020-04-09 ENCOUNTER — Ambulatory Visit (INDEPENDENT_AMBULATORY_CARE_PROVIDER_SITE_OTHER): Payer: BLUE CROSS/BLUE SHIELD | Admitting: Nurse Practitioner

## 2020-04-09 ENCOUNTER — Other Ambulatory Visit: Payer: Self-pay

## 2020-04-09 ENCOUNTER — Encounter (INDEPENDENT_AMBULATORY_CARE_PROVIDER_SITE_OTHER): Payer: Self-pay | Admitting: Nurse Practitioner

## 2020-04-09 VITALS — BP 187/97 | HR 80 | Resp 16

## 2020-04-09 DIAGNOSIS — E785 Hyperlipidemia, unspecified: Secondary | ICD-10-CM | POA: Diagnosis not present

## 2020-04-09 DIAGNOSIS — I6523 Occlusion and stenosis of bilateral carotid arteries: Secondary | ICD-10-CM | POA: Diagnosis not present

## 2020-04-09 DIAGNOSIS — E119 Type 2 diabetes mellitus without complications: Secondary | ICD-10-CM

## 2020-04-09 DIAGNOSIS — I10 Essential (primary) hypertension: Secondary | ICD-10-CM

## 2020-04-14 ENCOUNTER — Encounter (INDEPENDENT_AMBULATORY_CARE_PROVIDER_SITE_OTHER): Payer: Self-pay | Admitting: Nurse Practitioner

## 2020-04-14 NOTE — Progress Notes (Signed)
Subjective:    Patient ID: Danielle Paul, female    DOB: [birth year], 65 y.o.   MRN: [id number] Chief Complaint  Patient presents with  . Follow-up    Ref Graciela Husbands for stroke Dec 2021    Danielle Paul is a 65 year old woman that presents today following a carotid angiogram. The patient was in the hospital several weeks ago with a right hemispheric stroke with associated left arm weakness and numbness. The patient continues to have left arm weakness. It has improved but it has not resolved at all. Patient will continue to work with physical therapy. Initially carotid duplex suggested a high-grade stenosis however due to the patient's renal dysfunction they were unable to do a CT angiogram. Instead, the decision to do a carotid angiogram was made to utilize less dye.  The patient underwent carotid angiogram on 03/21/2020. The angiogram showed an occluded right internal carotid artery just beyond its origin with no significant collateralization. The left internal carotid artery had about a 20 to 25% stenosis.On the left side, intracranial filling was brisk and there was complete cross-filling from left to right including both the anterior and middle cerebral arteries filling from left to right without intracranial deficits.   Review of Systems  Neurological: Positive for weakness.  All other systems reviewed and are negative.      Objective:   Physical Exam Vitals reviewed.  HENT:     Head: Normocephalic.  Cardiovascular:     Rate and Rhythm: Normal rate and regular rhythm.     Pulses: Normal pulses.  Pulmonary:     Effort: Pulmonary effort is normal.  Skin:    General: Skin is warm and dry.  Neurological:     Mental Status: She is alert and oriented to person, place, and time.     Motor: Weakness present.     Coordination: Coordination abnormal.     Gait: Gait abnormal.  Psychiatric:        Mood and Affect: Mood normal.        Behavior: Behavior normal.        Thought Content: Thought  content normal.        Judgment: Judgment normal.     BP (!) 187/97 (BP Location: Right Arm)   Pulse 80   Resp 16   Past Medical History:  Diagnosis Date  . Anxiety   . Aortic valve disease   . Coronary artery disease   . CVA (cerebral vascular accident) (HCC)   . Diabetes mellitus without complication (HCC)   . Hypercholesteremia   . Hypertension     Social History   Socioeconomic History  . Marital status: Unknown    Spouse name: Not on file  . Number of children: Not on file  . Years of education: Not on file  . Highest education level: Not on file  Occupational History  . Not on file  Tobacco Use  . Smoking status: Never Smoker  . Smokeless tobacco: Never Used  Substance and Sexual Activity  . Alcohol use: Never  . Drug use: Never  . Sexual activity: Not on file  Other Topics Concern  . Not on file  Social History Narrative  . Not on file   Social Determinants of Health   Financial Resource Strain: Not on file  Food Insecurity: Not on file  Transportation Needs: Not on file  Physical Activity: Not on file  Stress: Not on file  Social Connections: Not on file  Intimate Partner Violence: Not on file  Past Surgical History:  Procedure Laterality Date  . AORTIC VALVE REPLACEMENT (AVR)/CORONARY ARTERY BYPASS GRAFTING (CABG)  2009  . CAROTID ANGIOGRAPHY N/A 03/21/2020   Procedure: CAROTID ANGIOGRAPHY;  Surgeon: Annice Needy, MD;  Location: [facility];  Service: Cardiovascular;  Laterality: N/A;    Family History  Problem Relation Age of Onset  . Other Mother   . Hypertension Mother   . Hypertension Father     Allergies  Allergen Reactions  . Atorvastatin Other (See Comments)    Muscle Pains  . Isosorbide Nitrate Other (See Comments)    Jittery and lightheadedness.  . Pravastatin Other (See Comments)  . Simvastatin Other (See Comments)    Muscle Pains    CBC Latest Ref Rng & Units 02/04/2020 02/01/2020  WBC 4.0 - 10.5 K/uL 5.8 6.3   Hemoglobin 12.0 - 15.0 g/dL 11.1(L) 12.6  Hematocrit 36.0 - 46.0 % 34.1(L) 38.1  Platelets 150 - 400 K/uL 289 331      CMP     Component Value Date/Time   NA 134 (L) 02/06/2020 0601   K 4.7 02/06/2020 0601   CL 105 02/06/2020 0601   CO2 22 02/06/2020 0601   GLUCOSE 107 (H) 02/06/2020 0601   BUN 14 03/21/2020 0821   CREATININE 1.72 (H) 03/21/2020 0821   CALCIUM 8.9 02/06/2020 0601   PROT 8.0 02/01/2020 2344   ALBUMIN 4.1 02/01/2020 2344   AST 23 02/01/2020 2344   ALT 21 02/01/2020 2344   ALKPHOS 76 02/01/2020 2344   BILITOT 0.5 02/01/2020 2344   GFRNONAA 33 (L) 03/21/2020 0821     No results found.     Assessment & Plan:   1. Bilateral carotid artery stenosis Based upon the carotid angiogram the patient has minimal stenosis in the left ICA with an occluded right ICA. I discussed with the patient that once the ICA becomes occluded we are unable to reopen it. The left ICA has minimal stenosis that does not require intervention at this time. We will continue to follow-up with the patient and have her return to the office in 6 months for a carotid duplex.  2. Primary hypertension Continue antihypertensive medications as already ordered, these medications have been reviewed and there are no changes at this time.   3. Diabetes mellitus without complication (HCC) Continue hypoglycemic medications as already ordered, these medications have been reviewed and there are no changes at this time.  Hgb A1C to be monitored as already arranged by primary service   4. Hyperlipidemia, unspecified hyperlipidemia type Continue statin as ordered and reviewed, no changes at this time    Current Outpatient Medications on File Prior to Visit  Medication Sig Dispense Refill  . aspirin 81 MG EC tablet Take 81 mg by mouth daily.    . carvedilol (COREG) 25 MG tablet Take 25 mg by mouth 2 (two) times daily.    . clopidogrel (PLAVIX) 75 MG tablet Take 1 tablet (75 mg total) by mouth daily.  30 tablet 0  . FLUoxetine (PROZAC) 10 MG tablet Take 10 mg by mouth daily.    . insulin lispro (HUMALOG) 100 UNIT/ML injection Inject 3-9 Units into the skin 3 (three) times daily before meals. (based upon blood glucose reading)    . LANTUS SOLOSTAR 100 UNIT/ML Solostar Pen Inject 30 Units into the skin at bedtime.    . polyethylene glycol (MIRALAX / GLYCOLAX) 17 g packet Take 17 g by mouth daily. (Patient taking differently: Take 17 g by mouth daily  as needed.) 14 each 0  . rosuvastatin (CRESTOR) 40 MG tablet Take 40 mg by mouth daily.     No current facility-administered medications on file prior to visit.    There are no Patient Instructions on file for this visit. No follow-ups on file.   Georgiana Spinner, NP

## 2020-04-15 ENCOUNTER — Ambulatory Visit: Payer: Self-pay | Admitting: Neurology

## 2020-06-10 ENCOUNTER — Encounter (INDEPENDENT_AMBULATORY_CARE_PROVIDER_SITE_OTHER): Payer: BLUE CROSS/BLUE SHIELD

## 2020-06-10 ENCOUNTER — Ambulatory Visit (INDEPENDENT_AMBULATORY_CARE_PROVIDER_SITE_OTHER): Payer: BLUE CROSS/BLUE SHIELD | Admitting: Nurse Practitioner

## 2020-06-27 DIAGNOSIS — I25810 Atherosclerosis of coronary artery bypass graft(s) without angina pectoris: Secondary | ICD-10-CM | POA: Diagnosis not present

## 2020-06-27 DIAGNOSIS — R0602 Shortness of breath: Secondary | ICD-10-CM | POA: Diagnosis not present

## 2020-06-27 DIAGNOSIS — I509 Heart failure, unspecified: Secondary | ICD-10-CM | POA: Diagnosis not present

## 2020-06-27 DIAGNOSIS — I10 Essential (primary) hypertension: Secondary | ICD-10-CM | POA: Diagnosis not present

## 2020-06-27 DIAGNOSIS — R072 Precordial pain: Secondary | ICD-10-CM | POA: Diagnosis not present

## 2020-06-27 DIAGNOSIS — E782 Mixed hyperlipidemia: Secondary | ICD-10-CM | POA: Diagnosis not present

## 2020-06-27 DIAGNOSIS — I2510 Atherosclerotic heart disease of native coronary artery without angina pectoris: Secondary | ICD-10-CM | POA: Diagnosis not present

## 2020-07-02 DIAGNOSIS — I2510 Atherosclerotic heart disease of native coronary artery without angina pectoris: Secondary | ICD-10-CM | POA: Diagnosis not present

## 2020-07-02 DIAGNOSIS — E782 Mixed hyperlipidemia: Secondary | ICD-10-CM | POA: Diagnosis not present

## 2020-07-02 DIAGNOSIS — I10 Essential (primary) hypertension: Secondary | ICD-10-CM | POA: Diagnosis not present

## 2020-07-02 DIAGNOSIS — I509 Heart failure, unspecified: Secondary | ICD-10-CM | POA: Diagnosis not present

## 2020-08-13 DIAGNOSIS — M799 Soft tissue disorder, unspecified: Secondary | ICD-10-CM | POA: Diagnosis not present

## 2020-08-13 DIAGNOSIS — M6281 Muscle weakness (generalized): Secondary | ICD-10-CM | POA: Diagnosis not present

## 2020-08-13 DIAGNOSIS — R2681 Unsteadiness on feet: Secondary | ICD-10-CM | POA: Diagnosis not present

## 2020-08-30 DIAGNOSIS — I25810 Atherosclerosis of coronary artery bypass graft(s) without angina pectoris: Secondary | ICD-10-CM | POA: Diagnosis not present

## 2020-08-30 DIAGNOSIS — I2510 Atherosclerotic heart disease of native coronary artery without angina pectoris: Secondary | ICD-10-CM | POA: Diagnosis not present

## 2020-08-30 DIAGNOSIS — I10 Essential (primary) hypertension: Secondary | ICD-10-CM | POA: Diagnosis not present

## 2020-08-30 DIAGNOSIS — E119 Type 2 diabetes mellitus without complications: Secondary | ICD-10-CM | POA: Diagnosis not present

## 2020-08-30 DIAGNOSIS — I69915 Cognitive social or emotional deficit following unspecified cerebrovascular disease: Secondary | ICD-10-CM | POA: Diagnosis not present

## 2020-08-30 DIAGNOSIS — I509 Heart failure, unspecified: Secondary | ICD-10-CM | POA: Diagnosis not present

## 2020-08-30 DIAGNOSIS — E669 Obesity, unspecified: Secondary | ICD-10-CM | POA: Diagnosis not present

## 2020-08-30 DIAGNOSIS — E782 Mixed hyperlipidemia: Secondary | ICD-10-CM | POA: Diagnosis not present

## 2020-09-05 DIAGNOSIS — I130 Hypertensive heart and chronic kidney disease with heart failure and stage 1 through stage 4 chronic kidney disease, or unspecified chronic kidney disease: Secondary | ICD-10-CM | POA: Diagnosis not present

## 2020-09-05 DIAGNOSIS — E1122 Type 2 diabetes mellitus with diabetic chronic kidney disease: Secondary | ICD-10-CM | POA: Diagnosis not present

## 2020-09-05 DIAGNOSIS — I2510 Atherosclerotic heart disease of native coronary artery without angina pectoris: Secondary | ICD-10-CM | POA: Diagnosis not present

## 2020-09-05 DIAGNOSIS — I69354 Hemiplegia and hemiparesis following cerebral infarction affecting left non-dominant side: Secondary | ICD-10-CM | POA: Diagnosis not present

## 2020-09-05 DIAGNOSIS — E669 Obesity, unspecified: Secondary | ICD-10-CM | POA: Diagnosis not present

## 2020-09-05 DIAGNOSIS — E782 Mixed hyperlipidemia: Secondary | ICD-10-CM | POA: Diagnosis not present

## 2020-09-05 DIAGNOSIS — I252 Old myocardial infarction: Secondary | ICD-10-CM | POA: Diagnosis not present

## 2020-09-05 DIAGNOSIS — I509 Heart failure, unspecified: Secondary | ICD-10-CM | POA: Diagnosis not present

## 2020-09-05 DIAGNOSIS — N189 Chronic kidney disease, unspecified: Secondary | ICD-10-CM | POA: Diagnosis not present

## 2020-09-09 DIAGNOSIS — I252 Old myocardial infarction: Secondary | ICD-10-CM | POA: Diagnosis not present

## 2020-09-09 DIAGNOSIS — E782 Mixed hyperlipidemia: Secondary | ICD-10-CM | POA: Diagnosis not present

## 2020-09-09 DIAGNOSIS — N189 Chronic kidney disease, unspecified: Secondary | ICD-10-CM | POA: Diagnosis not present

## 2020-09-09 DIAGNOSIS — I69354 Hemiplegia and hemiparesis following cerebral infarction affecting left non-dominant side: Secondary | ICD-10-CM | POA: Diagnosis not present

## 2020-09-09 DIAGNOSIS — I130 Hypertensive heart and chronic kidney disease with heart failure and stage 1 through stage 4 chronic kidney disease, or unspecified chronic kidney disease: Secondary | ICD-10-CM | POA: Diagnosis not present

## 2020-09-09 DIAGNOSIS — I2510 Atherosclerotic heart disease of native coronary artery without angina pectoris: Secondary | ICD-10-CM | POA: Diagnosis not present

## 2020-09-09 DIAGNOSIS — E669 Obesity, unspecified: Secondary | ICD-10-CM | POA: Diagnosis not present

## 2020-09-09 DIAGNOSIS — I509 Heart failure, unspecified: Secondary | ICD-10-CM | POA: Diagnosis not present

## 2020-09-09 DIAGNOSIS — E1122 Type 2 diabetes mellitus with diabetic chronic kidney disease: Secondary | ICD-10-CM | POA: Diagnosis not present

## 2020-09-18 DIAGNOSIS — I252 Old myocardial infarction: Secondary | ICD-10-CM | POA: Diagnosis not present

## 2020-09-18 DIAGNOSIS — I509 Heart failure, unspecified: Secondary | ICD-10-CM | POA: Diagnosis not present

## 2020-09-18 DIAGNOSIS — E1122 Type 2 diabetes mellitus with diabetic chronic kidney disease: Secondary | ICD-10-CM | POA: Diagnosis not present

## 2020-09-18 DIAGNOSIS — I130 Hypertensive heart and chronic kidney disease with heart failure and stage 1 through stage 4 chronic kidney disease, or unspecified chronic kidney disease: Secondary | ICD-10-CM | POA: Diagnosis not present

## 2020-09-18 DIAGNOSIS — E782 Mixed hyperlipidemia: Secondary | ICD-10-CM | POA: Diagnosis not present

## 2020-09-18 DIAGNOSIS — E669 Obesity, unspecified: Secondary | ICD-10-CM | POA: Diagnosis not present

## 2020-09-18 DIAGNOSIS — I2510 Atherosclerotic heart disease of native coronary artery without angina pectoris: Secondary | ICD-10-CM | POA: Diagnosis not present

## 2020-09-18 DIAGNOSIS — I69354 Hemiplegia and hemiparesis following cerebral infarction affecting left non-dominant side: Secondary | ICD-10-CM | POA: Diagnosis not present

## 2020-09-18 DIAGNOSIS — N189 Chronic kidney disease, unspecified: Secondary | ICD-10-CM | POA: Diagnosis not present

## 2020-09-19 DIAGNOSIS — N189 Chronic kidney disease, unspecified: Secondary | ICD-10-CM | POA: Diagnosis not present

## 2020-09-19 DIAGNOSIS — I130 Hypertensive heart and chronic kidney disease with heart failure and stage 1 through stage 4 chronic kidney disease, or unspecified chronic kidney disease: Secondary | ICD-10-CM | POA: Diagnosis not present

## 2020-09-19 DIAGNOSIS — I509 Heart failure, unspecified: Secondary | ICD-10-CM | POA: Diagnosis not present

## 2020-09-19 DIAGNOSIS — E782 Mixed hyperlipidemia: Secondary | ICD-10-CM | POA: Diagnosis not present

## 2020-09-19 DIAGNOSIS — I2510 Atherosclerotic heart disease of native coronary artery without angina pectoris: Secondary | ICD-10-CM | POA: Diagnosis not present

## 2020-09-19 DIAGNOSIS — E1122 Type 2 diabetes mellitus with diabetic chronic kidney disease: Secondary | ICD-10-CM | POA: Diagnosis not present

## 2020-09-19 DIAGNOSIS — I252 Old myocardial infarction: Secondary | ICD-10-CM | POA: Diagnosis not present

## 2020-09-19 DIAGNOSIS — E669 Obesity, unspecified: Secondary | ICD-10-CM | POA: Diagnosis not present

## 2020-09-19 DIAGNOSIS — I69354 Hemiplegia and hemiparesis following cerebral infarction affecting left non-dominant side: Secondary | ICD-10-CM | POA: Diagnosis not present

## 2020-09-20 DIAGNOSIS — I2510 Atherosclerotic heart disease of native coronary artery without angina pectoris: Secondary | ICD-10-CM | POA: Diagnosis not present

## 2020-09-20 DIAGNOSIS — N189 Chronic kidney disease, unspecified: Secondary | ICD-10-CM | POA: Diagnosis not present

## 2020-09-20 DIAGNOSIS — I252 Old myocardial infarction: Secondary | ICD-10-CM | POA: Diagnosis not present

## 2020-09-20 DIAGNOSIS — I69354 Hemiplegia and hemiparesis following cerebral infarction affecting left non-dominant side: Secondary | ICD-10-CM | POA: Diagnosis not present

## 2020-09-20 DIAGNOSIS — I130 Hypertensive heart and chronic kidney disease with heart failure and stage 1 through stage 4 chronic kidney disease, or unspecified chronic kidney disease: Secondary | ICD-10-CM | POA: Diagnosis not present

## 2020-09-20 DIAGNOSIS — E782 Mixed hyperlipidemia: Secondary | ICD-10-CM | POA: Diagnosis not present

## 2020-09-20 DIAGNOSIS — E669 Obesity, unspecified: Secondary | ICD-10-CM | POA: Diagnosis not present

## 2020-09-20 DIAGNOSIS — E1122 Type 2 diabetes mellitus with diabetic chronic kidney disease: Secondary | ICD-10-CM | POA: Diagnosis not present

## 2020-09-20 DIAGNOSIS — I509 Heart failure, unspecified: Secondary | ICD-10-CM | POA: Diagnosis not present

## 2020-09-23 DIAGNOSIS — I130 Hypertensive heart and chronic kidney disease with heart failure and stage 1 through stage 4 chronic kidney disease, or unspecified chronic kidney disease: Secondary | ICD-10-CM | POA: Diagnosis not present

## 2020-09-23 DIAGNOSIS — I2510 Atherosclerotic heart disease of native coronary artery without angina pectoris: Secondary | ICD-10-CM | POA: Diagnosis not present

## 2020-09-23 DIAGNOSIS — E1122 Type 2 diabetes mellitus with diabetic chronic kidney disease: Secondary | ICD-10-CM | POA: Diagnosis not present

## 2020-09-23 DIAGNOSIS — E782 Mixed hyperlipidemia: Secondary | ICD-10-CM | POA: Diagnosis not present

## 2020-09-23 DIAGNOSIS — I509 Heart failure, unspecified: Secondary | ICD-10-CM | POA: Diagnosis not present

## 2020-09-23 DIAGNOSIS — E669 Obesity, unspecified: Secondary | ICD-10-CM | POA: Diagnosis not present

## 2020-09-23 DIAGNOSIS — I252 Old myocardial infarction: Secondary | ICD-10-CM | POA: Diagnosis not present

## 2020-09-23 DIAGNOSIS — I69354 Hemiplegia and hemiparesis following cerebral infarction affecting left non-dominant side: Secondary | ICD-10-CM | POA: Diagnosis not present

## 2020-09-23 DIAGNOSIS — N189 Chronic kidney disease, unspecified: Secondary | ICD-10-CM | POA: Diagnosis not present

## 2020-09-24 DIAGNOSIS — I252 Old myocardial infarction: Secondary | ICD-10-CM | POA: Diagnosis not present

## 2020-09-24 DIAGNOSIS — I130 Hypertensive heart and chronic kidney disease with heart failure and stage 1 through stage 4 chronic kidney disease, or unspecified chronic kidney disease: Secondary | ICD-10-CM | POA: Diagnosis not present

## 2020-09-24 DIAGNOSIS — N189 Chronic kidney disease, unspecified: Secondary | ICD-10-CM | POA: Diagnosis not present

## 2020-09-24 DIAGNOSIS — E782 Mixed hyperlipidemia: Secondary | ICD-10-CM | POA: Diagnosis not present

## 2020-09-24 DIAGNOSIS — E669 Obesity, unspecified: Secondary | ICD-10-CM | POA: Diagnosis not present

## 2020-09-24 DIAGNOSIS — E1122 Type 2 diabetes mellitus with diabetic chronic kidney disease: Secondary | ICD-10-CM | POA: Diagnosis not present

## 2020-09-24 DIAGNOSIS — I509 Heart failure, unspecified: Secondary | ICD-10-CM | POA: Diagnosis not present

## 2020-09-24 DIAGNOSIS — I2510 Atherosclerotic heart disease of native coronary artery without angina pectoris: Secondary | ICD-10-CM | POA: Diagnosis not present

## 2020-09-24 DIAGNOSIS — I69354 Hemiplegia and hemiparesis following cerebral infarction affecting left non-dominant side: Secondary | ICD-10-CM | POA: Diagnosis not present

## 2020-09-27 DIAGNOSIS — E782 Mixed hyperlipidemia: Secondary | ICD-10-CM | POA: Diagnosis not present

## 2020-09-27 DIAGNOSIS — I130 Hypertensive heart and chronic kidney disease with heart failure and stage 1 through stage 4 chronic kidney disease, or unspecified chronic kidney disease: Secondary | ICD-10-CM | POA: Diagnosis not present

## 2020-09-27 DIAGNOSIS — I509 Heart failure, unspecified: Secondary | ICD-10-CM | POA: Diagnosis not present

## 2020-09-27 DIAGNOSIS — N189 Chronic kidney disease, unspecified: Secondary | ICD-10-CM | POA: Diagnosis not present

## 2020-09-27 DIAGNOSIS — E1122 Type 2 diabetes mellitus with diabetic chronic kidney disease: Secondary | ICD-10-CM | POA: Diagnosis not present

## 2020-09-27 DIAGNOSIS — E669 Obesity, unspecified: Secondary | ICD-10-CM | POA: Diagnosis not present

## 2020-09-27 DIAGNOSIS — I2510 Atherosclerotic heart disease of native coronary artery without angina pectoris: Secondary | ICD-10-CM | POA: Diagnosis not present

## 2020-09-27 DIAGNOSIS — I69354 Hemiplegia and hemiparesis following cerebral infarction affecting left non-dominant side: Secondary | ICD-10-CM | POA: Diagnosis not present

## 2020-09-27 DIAGNOSIS — I252 Old myocardial infarction: Secondary | ICD-10-CM | POA: Diagnosis not present

## 2020-09-30 DIAGNOSIS — I509 Heart failure, unspecified: Secondary | ICD-10-CM | POA: Diagnosis not present

## 2020-09-30 DIAGNOSIS — I69354 Hemiplegia and hemiparesis following cerebral infarction affecting left non-dominant side: Secondary | ICD-10-CM | POA: Diagnosis not present

## 2020-09-30 DIAGNOSIS — N189 Chronic kidney disease, unspecified: Secondary | ICD-10-CM | POA: Diagnosis not present

## 2020-09-30 DIAGNOSIS — E782 Mixed hyperlipidemia: Secondary | ICD-10-CM | POA: Diagnosis not present

## 2020-09-30 DIAGNOSIS — E669 Obesity, unspecified: Secondary | ICD-10-CM | POA: Diagnosis not present

## 2020-09-30 DIAGNOSIS — I2510 Atherosclerotic heart disease of native coronary artery without angina pectoris: Secondary | ICD-10-CM | POA: Diagnosis not present

## 2020-09-30 DIAGNOSIS — I252 Old myocardial infarction: Secondary | ICD-10-CM | POA: Diagnosis not present

## 2020-09-30 DIAGNOSIS — E1122 Type 2 diabetes mellitus with diabetic chronic kidney disease: Secondary | ICD-10-CM | POA: Diagnosis not present

## 2020-09-30 DIAGNOSIS — I130 Hypertensive heart and chronic kidney disease with heart failure and stage 1 through stage 4 chronic kidney disease, or unspecified chronic kidney disease: Secondary | ICD-10-CM | POA: Diagnosis not present

## 2020-10-02 DIAGNOSIS — I69354 Hemiplegia and hemiparesis following cerebral infarction affecting left non-dominant side: Secondary | ICD-10-CM | POA: Diagnosis not present

## 2020-10-02 DIAGNOSIS — E782 Mixed hyperlipidemia: Secondary | ICD-10-CM | POA: Diagnosis not present

## 2020-10-02 DIAGNOSIS — E669 Obesity, unspecified: Secondary | ICD-10-CM | POA: Diagnosis not present

## 2020-10-02 DIAGNOSIS — I252 Old myocardial infarction: Secondary | ICD-10-CM | POA: Diagnosis not present

## 2020-10-02 DIAGNOSIS — I2510 Atherosclerotic heart disease of native coronary artery without angina pectoris: Secondary | ICD-10-CM | POA: Diagnosis not present

## 2020-10-02 DIAGNOSIS — N189 Chronic kidney disease, unspecified: Secondary | ICD-10-CM | POA: Diagnosis not present

## 2020-10-02 DIAGNOSIS — I509 Heart failure, unspecified: Secondary | ICD-10-CM | POA: Diagnosis not present

## 2020-10-02 DIAGNOSIS — I130 Hypertensive heart and chronic kidney disease with heart failure and stage 1 through stage 4 chronic kidney disease, or unspecified chronic kidney disease: Secondary | ICD-10-CM | POA: Diagnosis not present

## 2020-10-02 DIAGNOSIS — E1122 Type 2 diabetes mellitus with diabetic chronic kidney disease: Secondary | ICD-10-CM | POA: Diagnosis not present

## 2020-10-03 DIAGNOSIS — I10 Essential (primary) hypertension: Secondary | ICD-10-CM | POA: Diagnosis not present

## 2020-10-03 DIAGNOSIS — I2510 Atherosclerotic heart disease of native coronary artery without angina pectoris: Secondary | ICD-10-CM | POA: Diagnosis not present

## 2020-10-03 DIAGNOSIS — E669 Obesity, unspecified: Secondary | ICD-10-CM | POA: Diagnosis not present

## 2020-10-03 DIAGNOSIS — I509 Heart failure, unspecified: Secondary | ICD-10-CM | POA: Diagnosis not present

## 2020-10-03 DIAGNOSIS — E782 Mixed hyperlipidemia: Secondary | ICD-10-CM | POA: Diagnosis not present

## 2020-10-03 DIAGNOSIS — I25810 Atherosclerosis of coronary artery bypass graft(s) without angina pectoris: Secondary | ICD-10-CM | POA: Diagnosis not present

## 2020-10-04 DIAGNOSIS — E669 Obesity, unspecified: Secondary | ICD-10-CM | POA: Diagnosis not present

## 2020-10-04 DIAGNOSIS — I69354 Hemiplegia and hemiparesis following cerebral infarction affecting left non-dominant side: Secondary | ICD-10-CM | POA: Diagnosis not present

## 2020-10-04 DIAGNOSIS — I509 Heart failure, unspecified: Secondary | ICD-10-CM | POA: Diagnosis not present

## 2020-10-04 DIAGNOSIS — I130 Hypertensive heart and chronic kidney disease with heart failure and stage 1 through stage 4 chronic kidney disease, or unspecified chronic kidney disease: Secondary | ICD-10-CM | POA: Diagnosis not present

## 2020-10-04 DIAGNOSIS — I252 Old myocardial infarction: Secondary | ICD-10-CM | POA: Diagnosis not present

## 2020-10-04 DIAGNOSIS — I2510 Atherosclerotic heart disease of native coronary artery without angina pectoris: Secondary | ICD-10-CM | POA: Diagnosis not present

## 2020-10-04 DIAGNOSIS — E782 Mixed hyperlipidemia: Secondary | ICD-10-CM | POA: Diagnosis not present

## 2020-10-04 DIAGNOSIS — E1122 Type 2 diabetes mellitus with diabetic chronic kidney disease: Secondary | ICD-10-CM | POA: Diagnosis not present

## 2020-10-04 DIAGNOSIS — N189 Chronic kidney disease, unspecified: Secondary | ICD-10-CM | POA: Diagnosis not present

## 2020-10-05 DIAGNOSIS — I2510 Atherosclerotic heart disease of native coronary artery without angina pectoris: Secondary | ICD-10-CM | POA: Diagnosis not present

## 2020-10-05 DIAGNOSIS — I252 Old myocardial infarction: Secondary | ICD-10-CM | POA: Diagnosis not present

## 2020-10-05 DIAGNOSIS — E782 Mixed hyperlipidemia: Secondary | ICD-10-CM | POA: Diagnosis not present

## 2020-10-05 DIAGNOSIS — I509 Heart failure, unspecified: Secondary | ICD-10-CM | POA: Diagnosis not present

## 2020-10-05 DIAGNOSIS — I130 Hypertensive heart and chronic kidney disease with heart failure and stage 1 through stage 4 chronic kidney disease, or unspecified chronic kidney disease: Secondary | ICD-10-CM | POA: Diagnosis not present

## 2020-10-05 DIAGNOSIS — N189 Chronic kidney disease, unspecified: Secondary | ICD-10-CM | POA: Diagnosis not present

## 2020-10-05 DIAGNOSIS — E1122 Type 2 diabetes mellitus with diabetic chronic kidney disease: Secondary | ICD-10-CM | POA: Diagnosis not present

## 2020-10-05 DIAGNOSIS — I69354 Hemiplegia and hemiparesis following cerebral infarction affecting left non-dominant side: Secondary | ICD-10-CM | POA: Diagnosis not present

## 2020-10-05 DIAGNOSIS — E669 Obesity, unspecified: Secondary | ICD-10-CM | POA: Diagnosis not present

## 2020-10-08 ENCOUNTER — Ambulatory Visit (INDEPENDENT_AMBULATORY_CARE_PROVIDER_SITE_OTHER): Payer: BLUE CROSS/BLUE SHIELD | Admitting: Vascular Surgery

## 2020-10-08 ENCOUNTER — Encounter (INDEPENDENT_AMBULATORY_CARE_PROVIDER_SITE_OTHER): Payer: BLUE CROSS/BLUE SHIELD

## 2020-10-08 DIAGNOSIS — I252 Old myocardial infarction: Secondary | ICD-10-CM | POA: Diagnosis not present

## 2020-10-08 DIAGNOSIS — E669 Obesity, unspecified: Secondary | ICD-10-CM | POA: Diagnosis not present

## 2020-10-08 DIAGNOSIS — I509 Heart failure, unspecified: Secondary | ICD-10-CM | POA: Diagnosis not present

## 2020-10-08 DIAGNOSIS — E1122 Type 2 diabetes mellitus with diabetic chronic kidney disease: Secondary | ICD-10-CM | POA: Diagnosis not present

## 2020-10-08 DIAGNOSIS — N189 Chronic kidney disease, unspecified: Secondary | ICD-10-CM | POA: Diagnosis not present

## 2020-10-08 DIAGNOSIS — I130 Hypertensive heart and chronic kidney disease with heart failure and stage 1 through stage 4 chronic kidney disease, or unspecified chronic kidney disease: Secondary | ICD-10-CM | POA: Diagnosis not present

## 2020-10-08 DIAGNOSIS — E782 Mixed hyperlipidemia: Secondary | ICD-10-CM | POA: Diagnosis not present

## 2020-10-08 DIAGNOSIS — I2510 Atherosclerotic heart disease of native coronary artery without angina pectoris: Secondary | ICD-10-CM | POA: Diagnosis not present

## 2020-10-08 DIAGNOSIS — I69354 Hemiplegia and hemiparesis following cerebral infarction affecting left non-dominant side: Secondary | ICD-10-CM | POA: Diagnosis not present

## 2020-10-15 DIAGNOSIS — E113393 Type 2 diabetes mellitus with moderate nonproliferative diabetic retinopathy without macular edema, bilateral: Secondary | ICD-10-CM | POA: Diagnosis not present

## 2020-10-16 DIAGNOSIS — N189 Chronic kidney disease, unspecified: Secondary | ICD-10-CM | POA: Diagnosis not present

## 2020-10-16 DIAGNOSIS — E782 Mixed hyperlipidemia: Secondary | ICD-10-CM | POA: Diagnosis not present

## 2020-10-16 DIAGNOSIS — E669 Obesity, unspecified: Secondary | ICD-10-CM | POA: Diagnosis not present

## 2020-10-16 DIAGNOSIS — I509 Heart failure, unspecified: Secondary | ICD-10-CM | POA: Diagnosis not present

## 2020-10-16 DIAGNOSIS — I2510 Atherosclerotic heart disease of native coronary artery without angina pectoris: Secondary | ICD-10-CM | POA: Diagnosis not present

## 2020-10-16 DIAGNOSIS — I69354 Hemiplegia and hemiparesis following cerebral infarction affecting left non-dominant side: Secondary | ICD-10-CM | POA: Diagnosis not present

## 2020-10-16 DIAGNOSIS — I252 Old myocardial infarction: Secondary | ICD-10-CM | POA: Diagnosis not present

## 2020-10-16 DIAGNOSIS — I130 Hypertensive heart and chronic kidney disease with heart failure and stage 1 through stage 4 chronic kidney disease, or unspecified chronic kidney disease: Secondary | ICD-10-CM | POA: Diagnosis not present

## 2020-10-16 DIAGNOSIS — E1122 Type 2 diabetes mellitus with diabetic chronic kidney disease: Secondary | ICD-10-CM | POA: Diagnosis not present

## 2020-10-17 DIAGNOSIS — I252 Old myocardial infarction: Secondary | ICD-10-CM | POA: Diagnosis not present

## 2020-10-17 DIAGNOSIS — N189 Chronic kidney disease, unspecified: Secondary | ICD-10-CM | POA: Diagnosis not present

## 2020-10-17 DIAGNOSIS — I69354 Hemiplegia and hemiparesis following cerebral infarction affecting left non-dominant side: Secondary | ICD-10-CM | POA: Diagnosis not present

## 2020-10-17 DIAGNOSIS — E782 Mixed hyperlipidemia: Secondary | ICD-10-CM | POA: Diagnosis not present

## 2020-10-17 DIAGNOSIS — I2510 Atherosclerotic heart disease of native coronary artery without angina pectoris: Secondary | ICD-10-CM | POA: Diagnosis not present

## 2020-10-17 DIAGNOSIS — E669 Obesity, unspecified: Secondary | ICD-10-CM | POA: Diagnosis not present

## 2020-10-17 DIAGNOSIS — I130 Hypertensive heart and chronic kidney disease with heart failure and stage 1 through stage 4 chronic kidney disease, or unspecified chronic kidney disease: Secondary | ICD-10-CM | POA: Diagnosis not present

## 2020-10-17 DIAGNOSIS — I509 Heart failure, unspecified: Secondary | ICD-10-CM | POA: Diagnosis not present

## 2020-10-17 DIAGNOSIS — E1122 Type 2 diabetes mellitus with diabetic chronic kidney disease: Secondary | ICD-10-CM | POA: Diagnosis not present

## 2020-10-21 DIAGNOSIS — E782 Mixed hyperlipidemia: Secondary | ICD-10-CM | POA: Diagnosis not present

## 2020-10-21 DIAGNOSIS — E669 Obesity, unspecified: Secondary | ICD-10-CM | POA: Diagnosis not present

## 2020-10-21 DIAGNOSIS — N189 Chronic kidney disease, unspecified: Secondary | ICD-10-CM | POA: Diagnosis not present

## 2020-10-21 DIAGNOSIS — I130 Hypertensive heart and chronic kidney disease with heart failure and stage 1 through stage 4 chronic kidney disease, or unspecified chronic kidney disease: Secondary | ICD-10-CM | POA: Diagnosis not present

## 2020-10-21 DIAGNOSIS — I2510 Atherosclerotic heart disease of native coronary artery without angina pectoris: Secondary | ICD-10-CM | POA: Diagnosis not present

## 2020-10-21 DIAGNOSIS — E1122 Type 2 diabetes mellitus with diabetic chronic kidney disease: Secondary | ICD-10-CM | POA: Diagnosis not present

## 2020-10-21 DIAGNOSIS — I509 Heart failure, unspecified: Secondary | ICD-10-CM | POA: Diagnosis not present

## 2020-10-21 DIAGNOSIS — I69354 Hemiplegia and hemiparesis following cerebral infarction affecting left non-dominant side: Secondary | ICD-10-CM | POA: Diagnosis not present

## 2020-10-21 DIAGNOSIS — I252 Old myocardial infarction: Secondary | ICD-10-CM | POA: Diagnosis not present

## 2020-10-23 DIAGNOSIS — I252 Old myocardial infarction: Secondary | ICD-10-CM | POA: Diagnosis not present

## 2020-10-23 DIAGNOSIS — I69354 Hemiplegia and hemiparesis following cerebral infarction affecting left non-dominant side: Secondary | ICD-10-CM | POA: Diagnosis not present

## 2020-10-23 DIAGNOSIS — I509 Heart failure, unspecified: Secondary | ICD-10-CM | POA: Diagnosis not present

## 2020-10-23 DIAGNOSIS — E1122 Type 2 diabetes mellitus with diabetic chronic kidney disease: Secondary | ICD-10-CM | POA: Diagnosis not present

## 2020-10-23 DIAGNOSIS — E782 Mixed hyperlipidemia: Secondary | ICD-10-CM | POA: Diagnosis not present

## 2020-10-23 DIAGNOSIS — E669 Obesity, unspecified: Secondary | ICD-10-CM | POA: Diagnosis not present

## 2020-10-23 DIAGNOSIS — N189 Chronic kidney disease, unspecified: Secondary | ICD-10-CM | POA: Diagnosis not present

## 2020-10-23 DIAGNOSIS — I2510 Atherosclerotic heart disease of native coronary artery without angina pectoris: Secondary | ICD-10-CM | POA: Diagnosis not present

## 2020-10-23 DIAGNOSIS — I130 Hypertensive heart and chronic kidney disease with heart failure and stage 1 through stage 4 chronic kidney disease, or unspecified chronic kidney disease: Secondary | ICD-10-CM | POA: Diagnosis not present

## 2020-10-30 DIAGNOSIS — E782 Mixed hyperlipidemia: Secondary | ICD-10-CM | POA: Diagnosis not present

## 2020-10-30 DIAGNOSIS — I69354 Hemiplegia and hemiparesis following cerebral infarction affecting left non-dominant side: Secondary | ICD-10-CM | POA: Diagnosis not present

## 2020-10-30 DIAGNOSIS — E1122 Type 2 diabetes mellitus with diabetic chronic kidney disease: Secondary | ICD-10-CM | POA: Diagnosis not present

## 2020-10-30 DIAGNOSIS — I2510 Atherosclerotic heart disease of native coronary artery without angina pectoris: Secondary | ICD-10-CM | POA: Diagnosis not present

## 2020-10-30 DIAGNOSIS — I130 Hypertensive heart and chronic kidney disease with heart failure and stage 1 through stage 4 chronic kidney disease, or unspecified chronic kidney disease: Secondary | ICD-10-CM | POA: Diagnosis not present

## 2020-10-30 DIAGNOSIS — I509 Heart failure, unspecified: Secondary | ICD-10-CM | POA: Diagnosis not present

## 2020-10-30 DIAGNOSIS — I252 Old myocardial infarction: Secondary | ICD-10-CM | POA: Diagnosis not present

## 2020-10-30 DIAGNOSIS — N189 Chronic kidney disease, unspecified: Secondary | ICD-10-CM | POA: Diagnosis not present

## 2020-10-30 DIAGNOSIS — E669 Obesity, unspecified: Secondary | ICD-10-CM | POA: Diagnosis not present

## 2020-11-01 DIAGNOSIS — N189 Chronic kidney disease, unspecified: Secondary | ICD-10-CM | POA: Diagnosis not present

## 2020-11-01 DIAGNOSIS — I130 Hypertensive heart and chronic kidney disease with heart failure and stage 1 through stage 4 chronic kidney disease, or unspecified chronic kidney disease: Secondary | ICD-10-CM | POA: Diagnosis not present

## 2020-11-01 DIAGNOSIS — E782 Mixed hyperlipidemia: Secondary | ICD-10-CM | POA: Diagnosis not present

## 2020-11-01 DIAGNOSIS — I252 Old myocardial infarction: Secondary | ICD-10-CM | POA: Diagnosis not present

## 2020-11-01 DIAGNOSIS — E1122 Type 2 diabetes mellitus with diabetic chronic kidney disease: Secondary | ICD-10-CM | POA: Diagnosis not present

## 2020-11-01 DIAGNOSIS — E669 Obesity, unspecified: Secondary | ICD-10-CM | POA: Diagnosis not present

## 2020-11-01 DIAGNOSIS — I69354 Hemiplegia and hemiparesis following cerebral infarction affecting left non-dominant side: Secondary | ICD-10-CM | POA: Diagnosis not present

## 2020-11-01 DIAGNOSIS — I2510 Atherosclerotic heart disease of native coronary artery without angina pectoris: Secondary | ICD-10-CM | POA: Diagnosis not present

## 2020-11-01 DIAGNOSIS — I509 Heart failure, unspecified: Secondary | ICD-10-CM | POA: Diagnosis not present

## 2020-11-04 DIAGNOSIS — E1122 Type 2 diabetes mellitus with diabetic chronic kidney disease: Secondary | ICD-10-CM | POA: Diagnosis not present

## 2020-11-04 DIAGNOSIS — I509 Heart failure, unspecified: Secondary | ICD-10-CM | POA: Diagnosis not present

## 2020-11-04 DIAGNOSIS — I252 Old myocardial infarction: Secondary | ICD-10-CM | POA: Diagnosis not present

## 2020-11-04 DIAGNOSIS — E669 Obesity, unspecified: Secondary | ICD-10-CM | POA: Diagnosis not present

## 2020-11-04 DIAGNOSIS — I2510 Atherosclerotic heart disease of native coronary artery without angina pectoris: Secondary | ICD-10-CM | POA: Diagnosis not present

## 2020-11-04 DIAGNOSIS — E782 Mixed hyperlipidemia: Secondary | ICD-10-CM | POA: Diagnosis not present

## 2020-11-04 DIAGNOSIS — I130 Hypertensive heart and chronic kidney disease with heart failure and stage 1 through stage 4 chronic kidney disease, or unspecified chronic kidney disease: Secondary | ICD-10-CM | POA: Diagnosis not present

## 2020-11-04 DIAGNOSIS — N189 Chronic kidney disease, unspecified: Secondary | ICD-10-CM | POA: Diagnosis not present

## 2020-11-04 DIAGNOSIS — I69354 Hemiplegia and hemiparesis following cerebral infarction affecting left non-dominant side: Secondary | ICD-10-CM | POA: Diagnosis not present

## 2020-11-05 DIAGNOSIS — I130 Hypertensive heart and chronic kidney disease with heart failure and stage 1 through stage 4 chronic kidney disease, or unspecified chronic kidney disease: Secondary | ICD-10-CM | POA: Diagnosis not present

## 2020-11-05 DIAGNOSIS — E669 Obesity, unspecified: Secondary | ICD-10-CM | POA: Diagnosis not present

## 2020-11-05 DIAGNOSIS — I69354 Hemiplegia and hemiparesis following cerebral infarction affecting left non-dominant side: Secondary | ICD-10-CM | POA: Diagnosis not present

## 2020-11-05 DIAGNOSIS — E782 Mixed hyperlipidemia: Secondary | ICD-10-CM | POA: Diagnosis not present

## 2020-11-05 DIAGNOSIS — I2510 Atherosclerotic heart disease of native coronary artery without angina pectoris: Secondary | ICD-10-CM | POA: Diagnosis not present

## 2020-11-05 DIAGNOSIS — I509 Heart failure, unspecified: Secondary | ICD-10-CM | POA: Diagnosis not present

## 2020-11-05 DIAGNOSIS — E1122 Type 2 diabetes mellitus with diabetic chronic kidney disease: Secondary | ICD-10-CM | POA: Diagnosis not present

## 2020-11-05 DIAGNOSIS — I252 Old myocardial infarction: Secondary | ICD-10-CM | POA: Diagnosis not present

## 2020-11-05 DIAGNOSIS — N189 Chronic kidney disease, unspecified: Secondary | ICD-10-CM | POA: Diagnosis not present

## 2020-11-11 ENCOUNTER — Other Ambulatory Visit (INDEPENDENT_AMBULATORY_CARE_PROVIDER_SITE_OTHER): Payer: Self-pay | Admitting: Nurse Practitioner

## 2020-11-11 DIAGNOSIS — I6523 Occlusion and stenosis of bilateral carotid arteries: Secondary | ICD-10-CM

## 2020-11-12 ENCOUNTER — Encounter (INDEPENDENT_AMBULATORY_CARE_PROVIDER_SITE_OTHER): Payer: Self-pay | Admitting: Vascular Surgery

## 2020-11-12 ENCOUNTER — Ambulatory Visit (INDEPENDENT_AMBULATORY_CARE_PROVIDER_SITE_OTHER): Payer: Medicare HMO

## 2020-11-12 ENCOUNTER — Other Ambulatory Visit: Payer: Self-pay

## 2020-11-12 ENCOUNTER — Ambulatory Visit (INDEPENDENT_AMBULATORY_CARE_PROVIDER_SITE_OTHER): Payer: Medicare HMO | Admitting: Vascular Surgery

## 2020-11-12 VITALS — BP 186/83 | HR 64 | Resp 16

## 2020-11-12 DIAGNOSIS — E119 Type 2 diabetes mellitus without complications: Secondary | ICD-10-CM | POA: Diagnosis not present

## 2020-11-12 DIAGNOSIS — I10 Essential (primary) hypertension: Secondary | ICD-10-CM | POA: Diagnosis not present

## 2020-11-12 DIAGNOSIS — I6523 Occlusion and stenosis of bilateral carotid arteries: Secondary | ICD-10-CM | POA: Diagnosis not present

## 2020-11-12 DIAGNOSIS — N183 Chronic kidney disease, stage 3 unspecified: Secondary | ICD-10-CM | POA: Diagnosis not present

## 2020-11-12 DIAGNOSIS — I639 Cerebral infarction, unspecified: Secondary | ICD-10-CM | POA: Diagnosis not present

## 2020-11-12 NOTE — Progress Notes (Signed)
MRN : [id number]  Danielle Paul is a 65 y.o. (10-14-1955) female who presents with chief complaint of  Chief Complaint  Patient presents with   Follow-up    Ultrasound follow up  .  History of Present Illness: Patient returns in follow-up of her carotid disease.  She seems to be doing reasonably well today.  She has had a previous stroke with resultant right-sided weakness which is stable.  She does not have any new arm or leg weakness or speech or swallowing difficulty. Duplex today shows a known right ICA occlusion and stable 1 to 39% left ICA stenosis.  Current Outpatient Medications  Medication Sig Dispense Refill   aspirin 81 MG EC tablet Take 81 mg by mouth daily.     carvedilol (COREG) 25 MG tablet Take 25 mg by mouth 2 (two) times daily.     clopidogrel (PLAVIX) 75 MG tablet Take 1 tablet (75 mg total) by mouth daily. 30 tablet 0   FLUoxetine (PROZAC) 10 MG tablet Take 10 mg by mouth daily.     insulin lispro (HUMALOG) 100 UNIT/ML injection Inject 3-9 Units into the skin 3 (three) times daily before meals. (based upon blood glucose reading)     LANTUS SOLOSTAR 100 UNIT/ML Solostar Pen Inject 30 Units into the skin at bedtime.     polyethylene glycol (MIRALAX / GLYCOLAX) 17 g packet Take 17 g by mouth daily. (Patient taking differently: Take 17 g by mouth daily as needed.) 14 each 0   rosuvastatin (CRESTOR) 40 MG tablet Take 40 mg by mouth daily.     No current facility-administered medications for this visit.    Past Medical History:  Diagnosis Date   Anxiety    Aortic valve disease    Coronary artery disease    CVA (cerebral vascular accident) (HCC)    Diabetes mellitus without complication (HCC)    Hypercholesteremia    Hypertension     Past Surgical History:  Procedure Laterality Date   AORTIC VALVE REPLACEMENT (AVR)/CORONARY ARTERY BYPASS GRAFTING (CABG)  2009   CAROTID ANGIOGRAPHY N/A 03/21/2020   Procedure: CAROTID ANGIOGRAPHY;  Surgeon: Annice Needy, MD;   Location: [facility];  Service: Cardiovascular;  Laterality: N/A;     Social History   Tobacco Use   Smoking status: Never   Smokeless tobacco: Never  Substance Use Topics   Alcohol use: Never   Drug use: Never      Family History  Problem Relation Age of Onset   Other Mother    Hypertension Mother    Hypertension Father      Allergies  Allergen Reactions   Atorvastatin Other (See Comments)    Muscle Pains   Isosorbide Nitrate Other (See Comments)    Jittery and lightheadedness.   Pravastatin Other (See Comments)   Simvastatin Other (See Comments)    Muscle Pains    REVIEW OF SYSTEMS (Negative unless checked)   Constitutional: [] Weight loss  [] Fever  [] Chills Cardiac: [] Chest pain   [] Chest pressure   [] Palpitations   [] Shortness of breath when laying flat   [] Shortness of breath at rest   [] Shortness of breath with exertion. Vascular:  [] Pain in legs with walking   [] Pain in legs at rest   [] Pain in legs when laying flat   [] Claudication   [] Pain in feet when walking  [] Pain in feet at rest  [] Pain in feet when laying flat   [] History of DVT   [] Phlebitis   [] Swelling in  legs   [] Varicose veins   [] Non-healing ulcers Pulmonary:   [] Uses home oxygen   [] Productive cough   [] Hemoptysis   [] Wheeze  [] COPD   [] Asthma Neurologic:  [] Dizziness  [] Blackouts   [] Seizures   [x] History of stroke   [x] History of TIA  [] Aphasia   [] Temporary blindness   [] Dysphagia   [x] Weakness or numbness in arms   [] Weakness or numbness in legs Musculoskeletal:  [x] Arthritis   [] Joint swelling   [x] Joint pain   [] Low back pain Hematologic:  [] Easy bruising  [] Easy bleeding   [] Hypercoagulable state   [] Anemic   Gastrointestinal:  [] Blood in stool   [] Vomiting blood  [] Gastroesophageal reflux/heartburn   [] Abdominal pain Genitourinary:  [x] Chronic kidney disease   [] Difficult urination  [] Frequent urination  [] Burning with urination   [] Hematuria Skin:  [] Rashes   [] Ulcers    [] Wounds Psychological:  [] History of anxiety   []  History of major depression.  Physical Examination  Vitals:   11/12/20 1454  BP: (!) 186/83  Pulse: 64  Resp: 16   There is no height or weight on file to calculate BMI. Gen:  WD/WN, NAD Head: Power/AT, No temporalis wasting. Ear/Nose/Throat: Hearing grossly intact, nares w/o erythema or drainage, trachea midline Eyes: Conjunctiva clear. Sclera non-icteric Neck: Supple.  Soft bilateral carotid bruit  Pulmonary:  Good air movement, equal and clear to auscultation bilaterally.  Cardiac: RRR, No JVD Vascular:  Vessel Right Left  Radial Palpable Palpable       Musculoskeletal:  Right-sided weakness.  In a wheelchair.  Mild lower extremity edema. Neurologic: CN 2-12 intact. Sensation grossly intact in extremities.  Motor exam as listed above. Psychiatric: Judgment intact, Mood & affect appropriate for pt's clinical situation. Dermatologic: No rashes or ulcers noted.  No cellulitis or open wounds.     CBC Lab Results  Component Value Date   WBC 5.8 02/04/2020   HGB 11.1 (L) 02/04/2020   HCT 34.1 (L) 02/04/2020   MCV 80.2 02/04/2020   PLT 289 02/04/2020    BMET    Component Value Date/Time   NA 134 (L) 02/06/2020 0601   K 4.7 02/06/2020 0601   CL 105 02/06/2020 0601   CO2 22 02/06/2020 0601   GLUCOSE 107 (H) 02/06/2020 0601   BUN 14 03/21/2020 0821   CREATININE 1.72 (H) 03/21/2020 0821   CALCIUM 8.9 02/06/2020 0601   GFRNONAA 33 (L) 03/21/2020 0821   CrCl cannot be calculated (Patient's most recent lab result is older than the maximum 21 days allowed.).  COAG Lab Results  Component Value Date   INR 1.0 02/01/2020    Radiology No results found.   Assessment/Plan Hypertension blood pressure control important in reducing the progression of atherosclerotic disease. On appropriate oral medications.     Diabetes mellitus without complication (HCC) blood glucose control important in reducing the progression of  atherosclerotic disease. Also, involved in wound healing. On appropriate medications.  Carotid stenosis Duplex today shows a known right ICA occlusion and stable 1 to 39% left ICA stenosis.  No major changes since her last visit.  No change in medical regimen.  Recheck in 6 months.    , MD  11/12/2020 3:29 PM    This note was created with Dragon medical transcription system.  Any errors from dictation are purely unintentional

## 2020-11-12 NOTE — Assessment & Plan Note (Signed)
Duplex today shows a known right ICA occlusion and stable 1 to 39% left ICA stenosis.  No major changes since her last visit.  No change in medical regimen.  Recheck in 6 months.

## 2020-11-13 DIAGNOSIS — I2510 Atherosclerotic heart disease of native coronary artery without angina pectoris: Secondary | ICD-10-CM | POA: Diagnosis not present

## 2020-11-13 DIAGNOSIS — I130 Hypertensive heart and chronic kidney disease with heart failure and stage 1 through stage 4 chronic kidney disease, or unspecified chronic kidney disease: Secondary | ICD-10-CM | POA: Diagnosis not present

## 2020-11-13 DIAGNOSIS — I509 Heart failure, unspecified: Secondary | ICD-10-CM | POA: Diagnosis not present

## 2020-11-13 DIAGNOSIS — I69354 Hemiplegia and hemiparesis following cerebral infarction affecting left non-dominant side: Secondary | ICD-10-CM | POA: Diagnosis not present

## 2020-11-13 DIAGNOSIS — E782 Mixed hyperlipidemia: Secondary | ICD-10-CM | POA: Diagnosis not present

## 2020-11-13 DIAGNOSIS — N189 Chronic kidney disease, unspecified: Secondary | ICD-10-CM | POA: Diagnosis not present

## 2020-11-13 DIAGNOSIS — I252 Old myocardial infarction: Secondary | ICD-10-CM | POA: Diagnosis not present

## 2020-11-13 DIAGNOSIS — E669 Obesity, unspecified: Secondary | ICD-10-CM | POA: Diagnosis not present

## 2020-11-13 DIAGNOSIS — E1122 Type 2 diabetes mellitus with diabetic chronic kidney disease: Secondary | ICD-10-CM | POA: Diagnosis not present

## 2020-11-19 DIAGNOSIS — E669 Obesity, unspecified: Secondary | ICD-10-CM | POA: Diagnosis not present

## 2020-11-19 DIAGNOSIS — N189 Chronic kidney disease, unspecified: Secondary | ICD-10-CM | POA: Diagnosis not present

## 2020-11-19 DIAGNOSIS — I252 Old myocardial infarction: Secondary | ICD-10-CM | POA: Diagnosis not present

## 2020-11-19 DIAGNOSIS — I130 Hypertensive heart and chronic kidney disease with heart failure and stage 1 through stage 4 chronic kidney disease, or unspecified chronic kidney disease: Secondary | ICD-10-CM | POA: Diagnosis not present

## 2020-11-19 DIAGNOSIS — I69354 Hemiplegia and hemiparesis following cerebral infarction affecting left non-dominant side: Secondary | ICD-10-CM | POA: Diagnosis not present

## 2020-11-19 DIAGNOSIS — E1122 Type 2 diabetes mellitus with diabetic chronic kidney disease: Secondary | ICD-10-CM | POA: Diagnosis not present

## 2020-11-19 DIAGNOSIS — E782 Mixed hyperlipidemia: Secondary | ICD-10-CM | POA: Diagnosis not present

## 2020-11-19 DIAGNOSIS — I509 Heart failure, unspecified: Secondary | ICD-10-CM | POA: Diagnosis not present

## 2020-11-19 DIAGNOSIS — I2510 Atherosclerotic heart disease of native coronary artery without angina pectoris: Secondary | ICD-10-CM | POA: Diagnosis not present

## 2020-11-21 DIAGNOSIS — Z0100 Encounter for examination of eyes and vision without abnormal findings: Secondary | ICD-10-CM | POA: Diagnosis not present

## 2020-11-21 DIAGNOSIS — E113211 Type 2 diabetes mellitus with mild nonproliferative diabetic retinopathy with macular edema, right eye: Secondary | ICD-10-CM | POA: Diagnosis not present

## 2020-11-21 DIAGNOSIS — H2511 Age-related nuclear cataract, right eye: Secondary | ICD-10-CM | POA: Diagnosis not present

## 2020-11-26 DIAGNOSIS — I509 Heart failure, unspecified: Secondary | ICD-10-CM | POA: Diagnosis not present

## 2020-11-26 DIAGNOSIS — I252 Old myocardial infarction: Secondary | ICD-10-CM | POA: Diagnosis not present

## 2020-11-26 DIAGNOSIS — E782 Mixed hyperlipidemia: Secondary | ICD-10-CM | POA: Diagnosis not present

## 2020-11-26 DIAGNOSIS — E669 Obesity, unspecified: Secondary | ICD-10-CM | POA: Diagnosis not present

## 2020-11-26 DIAGNOSIS — I130 Hypertensive heart and chronic kidney disease with heart failure and stage 1 through stage 4 chronic kidney disease, or unspecified chronic kidney disease: Secondary | ICD-10-CM | POA: Diagnosis not present

## 2020-11-26 DIAGNOSIS — N189 Chronic kidney disease, unspecified: Secondary | ICD-10-CM | POA: Diagnosis not present

## 2020-11-26 DIAGNOSIS — I2510 Atherosclerotic heart disease of native coronary artery without angina pectoris: Secondary | ICD-10-CM | POA: Diagnosis not present

## 2020-11-26 DIAGNOSIS — E1122 Type 2 diabetes mellitus with diabetic chronic kidney disease: Secondary | ICD-10-CM | POA: Diagnosis not present

## 2020-11-26 DIAGNOSIS — I69354 Hemiplegia and hemiparesis following cerebral infarction affecting left non-dominant side: Secondary | ICD-10-CM | POA: Diagnosis not present

## 2020-11-29 DIAGNOSIS — E782 Mixed hyperlipidemia: Secondary | ICD-10-CM | POA: Diagnosis not present

## 2020-11-29 DIAGNOSIS — I509 Heart failure, unspecified: Secondary | ICD-10-CM | POA: Diagnosis not present

## 2020-11-29 DIAGNOSIS — I252 Old myocardial infarction: Secondary | ICD-10-CM | POA: Diagnosis not present

## 2020-11-29 DIAGNOSIS — E669 Obesity, unspecified: Secondary | ICD-10-CM | POA: Diagnosis not present

## 2020-11-29 DIAGNOSIS — N189 Chronic kidney disease, unspecified: Secondary | ICD-10-CM | POA: Diagnosis not present

## 2020-11-29 DIAGNOSIS — I69354 Hemiplegia and hemiparesis following cerebral infarction affecting left non-dominant side: Secondary | ICD-10-CM | POA: Diagnosis not present

## 2020-11-29 DIAGNOSIS — I130 Hypertensive heart and chronic kidney disease with heart failure and stage 1 through stage 4 chronic kidney disease, or unspecified chronic kidney disease: Secondary | ICD-10-CM | POA: Diagnosis not present

## 2020-11-29 DIAGNOSIS — E1122 Type 2 diabetes mellitus with diabetic chronic kidney disease: Secondary | ICD-10-CM | POA: Diagnosis not present

## 2020-11-29 DIAGNOSIS — I2510 Atherosclerotic heart disease of native coronary artery without angina pectoris: Secondary | ICD-10-CM | POA: Diagnosis not present

## 2020-12-04 DIAGNOSIS — E669 Obesity, unspecified: Secondary | ICD-10-CM | POA: Diagnosis not present

## 2020-12-04 DIAGNOSIS — N189 Chronic kidney disease, unspecified: Secondary | ICD-10-CM | POA: Diagnosis not present

## 2020-12-04 DIAGNOSIS — I69354 Hemiplegia and hemiparesis following cerebral infarction affecting left non-dominant side: Secondary | ICD-10-CM | POA: Diagnosis not present

## 2020-12-04 DIAGNOSIS — I252 Old myocardial infarction: Secondary | ICD-10-CM | POA: Diagnosis not present

## 2020-12-04 DIAGNOSIS — E782 Mixed hyperlipidemia: Secondary | ICD-10-CM | POA: Diagnosis not present

## 2020-12-04 DIAGNOSIS — I2510 Atherosclerotic heart disease of native coronary artery without angina pectoris: Secondary | ICD-10-CM | POA: Diagnosis not present

## 2020-12-04 DIAGNOSIS — E1122 Type 2 diabetes mellitus with diabetic chronic kidney disease: Secondary | ICD-10-CM | POA: Diagnosis not present

## 2020-12-04 DIAGNOSIS — I130 Hypertensive heart and chronic kidney disease with heart failure and stage 1 through stage 4 chronic kidney disease, or unspecified chronic kidney disease: Secondary | ICD-10-CM | POA: Diagnosis not present

## 2020-12-04 DIAGNOSIS — I509 Heart failure, unspecified: Secondary | ICD-10-CM | POA: Diagnosis not present

## 2020-12-05 DIAGNOSIS — I130 Hypertensive heart and chronic kidney disease with heart failure and stage 1 through stage 4 chronic kidney disease, or unspecified chronic kidney disease: Secondary | ICD-10-CM | POA: Diagnosis not present

## 2020-12-05 DIAGNOSIS — I69354 Hemiplegia and hemiparesis following cerebral infarction affecting left non-dominant side: Secondary | ICD-10-CM | POA: Diagnosis not present

## 2020-12-05 DIAGNOSIS — I2510 Atherosclerotic heart disease of native coronary artery without angina pectoris: Secondary | ICD-10-CM | POA: Diagnosis not present

## 2020-12-05 DIAGNOSIS — E669 Obesity, unspecified: Secondary | ICD-10-CM | POA: Diagnosis not present

## 2020-12-05 DIAGNOSIS — E1122 Type 2 diabetes mellitus with diabetic chronic kidney disease: Secondary | ICD-10-CM | POA: Diagnosis not present

## 2020-12-05 DIAGNOSIS — N189 Chronic kidney disease, unspecified: Secondary | ICD-10-CM | POA: Diagnosis not present

## 2020-12-05 DIAGNOSIS — E782 Mixed hyperlipidemia: Secondary | ICD-10-CM | POA: Diagnosis not present

## 2020-12-05 DIAGNOSIS — I509 Heart failure, unspecified: Secondary | ICD-10-CM | POA: Diagnosis not present

## 2020-12-05 DIAGNOSIS — H26492 Other secondary cataract, left eye: Secondary | ICD-10-CM | POA: Diagnosis not present

## 2020-12-05 DIAGNOSIS — I252 Old myocardial infarction: Secondary | ICD-10-CM | POA: Diagnosis not present

## 2020-12-09 DIAGNOSIS — N189 Chronic kidney disease, unspecified: Secondary | ICD-10-CM | POA: Diagnosis not present

## 2020-12-09 DIAGNOSIS — I509 Heart failure, unspecified: Secondary | ICD-10-CM | POA: Diagnosis not present

## 2020-12-09 DIAGNOSIS — I130 Hypertensive heart and chronic kidney disease with heart failure and stage 1 through stage 4 chronic kidney disease, or unspecified chronic kidney disease: Secondary | ICD-10-CM | POA: Diagnosis not present

## 2020-12-09 DIAGNOSIS — I2510 Atherosclerotic heart disease of native coronary artery without angina pectoris: Secondary | ICD-10-CM | POA: Diagnosis not present

## 2020-12-09 DIAGNOSIS — E782 Mixed hyperlipidemia: Secondary | ICD-10-CM | POA: Diagnosis not present

## 2020-12-09 DIAGNOSIS — I252 Old myocardial infarction: Secondary | ICD-10-CM | POA: Diagnosis not present

## 2020-12-09 DIAGNOSIS — E669 Obesity, unspecified: Secondary | ICD-10-CM | POA: Diagnosis not present

## 2020-12-09 DIAGNOSIS — E1122 Type 2 diabetes mellitus with diabetic chronic kidney disease: Secondary | ICD-10-CM | POA: Diagnosis not present

## 2020-12-09 DIAGNOSIS — I69354 Hemiplegia and hemiparesis following cerebral infarction affecting left non-dominant side: Secondary | ICD-10-CM | POA: Diagnosis not present

## 2020-12-12 DIAGNOSIS — E782 Mixed hyperlipidemia: Secondary | ICD-10-CM | POA: Diagnosis not present

## 2020-12-12 DIAGNOSIS — E669 Obesity, unspecified: Secondary | ICD-10-CM | POA: Diagnosis not present

## 2020-12-12 DIAGNOSIS — I69354 Hemiplegia and hemiparesis following cerebral infarction affecting left non-dominant side: Secondary | ICD-10-CM | POA: Diagnosis not present

## 2020-12-12 DIAGNOSIS — I130 Hypertensive heart and chronic kidney disease with heart failure and stage 1 through stage 4 chronic kidney disease, or unspecified chronic kidney disease: Secondary | ICD-10-CM | POA: Diagnosis not present

## 2020-12-12 DIAGNOSIS — I252 Old myocardial infarction: Secondary | ICD-10-CM | POA: Diagnosis not present

## 2020-12-12 DIAGNOSIS — E1122 Type 2 diabetes mellitus with diabetic chronic kidney disease: Secondary | ICD-10-CM | POA: Diagnosis not present

## 2020-12-12 DIAGNOSIS — N189 Chronic kidney disease, unspecified: Secondary | ICD-10-CM | POA: Diagnosis not present

## 2020-12-12 DIAGNOSIS — I2510 Atherosclerotic heart disease of native coronary artery without angina pectoris: Secondary | ICD-10-CM | POA: Diagnosis not present

## 2020-12-12 DIAGNOSIS — I509 Heart failure, unspecified: Secondary | ICD-10-CM | POA: Diagnosis not present

## 2020-12-17 DIAGNOSIS — N189 Chronic kidney disease, unspecified: Secondary | ICD-10-CM | POA: Diagnosis not present

## 2020-12-17 DIAGNOSIS — E669 Obesity, unspecified: Secondary | ICD-10-CM | POA: Diagnosis not present

## 2020-12-17 DIAGNOSIS — I130 Hypertensive heart and chronic kidney disease with heart failure and stage 1 through stage 4 chronic kidney disease, or unspecified chronic kidney disease: Secondary | ICD-10-CM | POA: Diagnosis not present

## 2020-12-17 DIAGNOSIS — I69354 Hemiplegia and hemiparesis following cerebral infarction affecting left non-dominant side: Secondary | ICD-10-CM | POA: Diagnosis not present

## 2020-12-17 DIAGNOSIS — I2510 Atherosclerotic heart disease of native coronary artery without angina pectoris: Secondary | ICD-10-CM | POA: Diagnosis not present

## 2020-12-17 DIAGNOSIS — E1122 Type 2 diabetes mellitus with diabetic chronic kidney disease: Secondary | ICD-10-CM | POA: Diagnosis not present

## 2020-12-17 DIAGNOSIS — I252 Old myocardial infarction: Secondary | ICD-10-CM | POA: Diagnosis not present

## 2020-12-17 DIAGNOSIS — I509 Heart failure, unspecified: Secondary | ICD-10-CM | POA: Diagnosis not present

## 2020-12-17 DIAGNOSIS — E782 Mixed hyperlipidemia: Secondary | ICD-10-CM | POA: Diagnosis not present

## 2020-12-23 DIAGNOSIS — E1122 Type 2 diabetes mellitus with diabetic chronic kidney disease: Secondary | ICD-10-CM | POA: Diagnosis not present

## 2020-12-23 DIAGNOSIS — N189 Chronic kidney disease, unspecified: Secondary | ICD-10-CM | POA: Diagnosis not present

## 2020-12-23 DIAGNOSIS — I69354 Hemiplegia and hemiparesis following cerebral infarction affecting left non-dominant side: Secondary | ICD-10-CM | POA: Diagnosis not present

## 2020-12-23 DIAGNOSIS — I2510 Atherosclerotic heart disease of native coronary artery without angina pectoris: Secondary | ICD-10-CM | POA: Diagnosis not present

## 2020-12-23 DIAGNOSIS — I509 Heart failure, unspecified: Secondary | ICD-10-CM | POA: Diagnosis not present

## 2020-12-23 DIAGNOSIS — I130 Hypertensive heart and chronic kidney disease with heart failure and stage 1 through stage 4 chronic kidney disease, or unspecified chronic kidney disease: Secondary | ICD-10-CM | POA: Diagnosis not present

## 2020-12-23 DIAGNOSIS — E782 Mixed hyperlipidemia: Secondary | ICD-10-CM | POA: Diagnosis not present

## 2020-12-23 DIAGNOSIS — I252 Old myocardial infarction: Secondary | ICD-10-CM | POA: Diagnosis not present

## 2020-12-23 DIAGNOSIS — E669 Obesity, unspecified: Secondary | ICD-10-CM | POA: Diagnosis not present

## 2020-12-25 DIAGNOSIS — I252 Old myocardial infarction: Secondary | ICD-10-CM | POA: Diagnosis not present

## 2020-12-25 DIAGNOSIS — I2510 Atherosclerotic heart disease of native coronary artery without angina pectoris: Secondary | ICD-10-CM | POA: Diagnosis not present

## 2020-12-25 DIAGNOSIS — E669 Obesity, unspecified: Secondary | ICD-10-CM | POA: Diagnosis not present

## 2020-12-25 DIAGNOSIS — I509 Heart failure, unspecified: Secondary | ICD-10-CM | POA: Diagnosis not present

## 2020-12-25 DIAGNOSIS — E1122 Type 2 diabetes mellitus with diabetic chronic kidney disease: Secondary | ICD-10-CM | POA: Diagnosis not present

## 2020-12-25 DIAGNOSIS — I130 Hypertensive heart and chronic kidney disease with heart failure and stage 1 through stage 4 chronic kidney disease, or unspecified chronic kidney disease: Secondary | ICD-10-CM | POA: Diagnosis not present

## 2020-12-25 DIAGNOSIS — I69354 Hemiplegia and hemiparesis following cerebral infarction affecting left non-dominant side: Secondary | ICD-10-CM | POA: Diagnosis not present

## 2020-12-25 DIAGNOSIS — E782 Mixed hyperlipidemia: Secondary | ICD-10-CM | POA: Diagnosis not present

## 2020-12-25 DIAGNOSIS — N189 Chronic kidney disease, unspecified: Secondary | ICD-10-CM | POA: Diagnosis not present

## 2020-12-31 DIAGNOSIS — I509 Heart failure, unspecified: Secondary | ICD-10-CM | POA: Diagnosis not present

## 2020-12-31 DIAGNOSIS — I2510 Atherosclerotic heart disease of native coronary artery without angina pectoris: Secondary | ICD-10-CM | POA: Diagnosis not present

## 2020-12-31 DIAGNOSIS — E1122 Type 2 diabetes mellitus with diabetic chronic kidney disease: Secondary | ICD-10-CM | POA: Diagnosis not present

## 2020-12-31 DIAGNOSIS — E669 Obesity, unspecified: Secondary | ICD-10-CM | POA: Diagnosis not present

## 2020-12-31 DIAGNOSIS — E782 Mixed hyperlipidemia: Secondary | ICD-10-CM | POA: Diagnosis not present

## 2020-12-31 DIAGNOSIS — I130 Hypertensive heart and chronic kidney disease with heart failure and stage 1 through stage 4 chronic kidney disease, or unspecified chronic kidney disease: Secondary | ICD-10-CM | POA: Diagnosis not present

## 2020-12-31 DIAGNOSIS — N189 Chronic kidney disease, unspecified: Secondary | ICD-10-CM | POA: Diagnosis not present

## 2020-12-31 DIAGNOSIS — I69354 Hemiplegia and hemiparesis following cerebral infarction affecting left non-dominant side: Secondary | ICD-10-CM | POA: Diagnosis not present

## 2020-12-31 DIAGNOSIS — I252 Old myocardial infarction: Secondary | ICD-10-CM | POA: Diagnosis not present

## 2021-01-01 DIAGNOSIS — E1122 Type 2 diabetes mellitus with diabetic chronic kidney disease: Secondary | ICD-10-CM | POA: Diagnosis not present

## 2021-01-01 DIAGNOSIS — I130 Hypertensive heart and chronic kidney disease with heart failure and stage 1 through stage 4 chronic kidney disease, or unspecified chronic kidney disease: Secondary | ICD-10-CM | POA: Diagnosis not present

## 2021-01-01 DIAGNOSIS — E669 Obesity, unspecified: Secondary | ICD-10-CM | POA: Diagnosis not present

## 2021-01-01 DIAGNOSIS — N189 Chronic kidney disease, unspecified: Secondary | ICD-10-CM | POA: Diagnosis not present

## 2021-01-01 DIAGNOSIS — I252 Old myocardial infarction: Secondary | ICD-10-CM | POA: Diagnosis not present

## 2021-01-01 DIAGNOSIS — I2510 Atherosclerotic heart disease of native coronary artery without angina pectoris: Secondary | ICD-10-CM | POA: Diagnosis not present

## 2021-01-01 DIAGNOSIS — I69354 Hemiplegia and hemiparesis following cerebral infarction affecting left non-dominant side: Secondary | ICD-10-CM | POA: Diagnosis not present

## 2021-01-01 DIAGNOSIS — I509 Heart failure, unspecified: Secondary | ICD-10-CM | POA: Diagnosis not present

## 2021-01-01 DIAGNOSIS — E782 Mixed hyperlipidemia: Secondary | ICD-10-CM | POA: Diagnosis not present

## 2021-01-21 DIAGNOSIS — E119 Type 2 diabetes mellitus without complications: Secondary | ICD-10-CM | POA: Diagnosis not present

## 2021-01-21 DIAGNOSIS — I2510 Atherosclerotic heart disease of native coronary artery without angina pectoris: Secondary | ICD-10-CM | POA: Diagnosis not present

## 2021-01-21 DIAGNOSIS — E782 Mixed hyperlipidemia: Secondary | ICD-10-CM | POA: Diagnosis not present

## 2021-01-21 DIAGNOSIS — I63133 Cerebral infarction due to embolism of bilateral carotid arteries: Secondary | ICD-10-CM | POA: Diagnosis not present

## 2021-01-21 DIAGNOSIS — I10 Essential (primary) hypertension: Secondary | ICD-10-CM | POA: Diagnosis not present

## 2021-01-21 DIAGNOSIS — E559 Vitamin D deficiency, unspecified: Secondary | ICD-10-CM | POA: Diagnosis not present

## 2021-01-21 DIAGNOSIS — Z23 Encounter for immunization: Secondary | ICD-10-CM | POA: Diagnosis not present

## 2021-02-04 DIAGNOSIS — I2510 Atherosclerotic heart disease of native coronary artery without angina pectoris: Secondary | ICD-10-CM | POA: Diagnosis not present

## 2021-02-04 DIAGNOSIS — R0602 Shortness of breath: Secondary | ICD-10-CM | POA: Diagnosis not present

## 2021-02-04 DIAGNOSIS — I10 Essential (primary) hypertension: Secondary | ICD-10-CM | POA: Diagnosis not present

## 2021-02-04 DIAGNOSIS — E782 Mixed hyperlipidemia: Secondary | ICD-10-CM | POA: Diagnosis not present

## 2021-02-04 DIAGNOSIS — I509 Heart failure, unspecified: Secondary | ICD-10-CM | POA: Diagnosis not present

## 2021-02-04 DIAGNOSIS — I25810 Atherosclerosis of coronary artery bypass graft(s) without angina pectoris: Secondary | ICD-10-CM | POA: Diagnosis not present

## 2021-04-04 DIAGNOSIS — E669 Obesity, unspecified: Secondary | ICD-10-CM | POA: Diagnosis not present

## 2021-04-04 DIAGNOSIS — E785 Hyperlipidemia, unspecified: Secondary | ICD-10-CM | POA: Diagnosis not present

## 2021-04-04 DIAGNOSIS — E118 Type 2 diabetes mellitus with unspecified complications: Secondary | ICD-10-CM | POA: Diagnosis not present

## 2021-04-04 DIAGNOSIS — I10 Essential (primary) hypertension: Secondary | ICD-10-CM | POA: Diagnosis not present

## 2021-05-01 DIAGNOSIS — E119 Type 2 diabetes mellitus without complications: Secondary | ICD-10-CM | POA: Diagnosis not present

## 2021-05-01 DIAGNOSIS — I63133 Cerebral infarction due to embolism of bilateral carotid arteries: Secondary | ICD-10-CM | POA: Diagnosis not present

## 2021-05-01 DIAGNOSIS — I2510 Atherosclerotic heart disease of native coronary artery without angina pectoris: Secondary | ICD-10-CM | POA: Diagnosis not present

## 2021-05-01 DIAGNOSIS — E782 Mixed hyperlipidemia: Secondary | ICD-10-CM | POA: Diagnosis not present

## 2021-05-01 DIAGNOSIS — I10 Essential (primary) hypertension: Secondary | ICD-10-CM | POA: Diagnosis not present

## 2021-05-06 DIAGNOSIS — R0602 Shortness of breath: Secondary | ICD-10-CM | POA: Diagnosis not present

## 2021-05-06 DIAGNOSIS — I10 Essential (primary) hypertension: Secondary | ICD-10-CM | POA: Diagnosis not present

## 2021-05-06 DIAGNOSIS — E782 Mixed hyperlipidemia: Secondary | ICD-10-CM | POA: Diagnosis not present

## 2021-05-06 DIAGNOSIS — I25810 Atherosclerosis of coronary artery bypass graft(s) without angina pectoris: Secondary | ICD-10-CM | POA: Diagnosis not present

## 2021-05-06 DIAGNOSIS — I2510 Atherosclerotic heart disease of native coronary artery without angina pectoris: Secondary | ICD-10-CM | POA: Diagnosis not present

## 2021-05-06 DIAGNOSIS — I509 Heart failure, unspecified: Secondary | ICD-10-CM | POA: Diagnosis not present

## 2021-05-13 ENCOUNTER — Ambulatory Visit (INDEPENDENT_AMBULATORY_CARE_PROVIDER_SITE_OTHER): Payer: Medicare HMO | Admitting: Nurse Practitioner

## 2021-05-13 ENCOUNTER — Other Ambulatory Visit: Payer: Self-pay

## 2021-05-13 ENCOUNTER — Encounter (INDEPENDENT_AMBULATORY_CARE_PROVIDER_SITE_OTHER): Payer: Medicare HMO

## 2021-05-13 ENCOUNTER — Ambulatory Visit (INDEPENDENT_AMBULATORY_CARE_PROVIDER_SITE_OTHER): Payer: Medicare HMO

## 2021-05-13 ENCOUNTER — Encounter (INDEPENDENT_AMBULATORY_CARE_PROVIDER_SITE_OTHER): Payer: Self-pay | Admitting: Nurse Practitioner

## 2021-05-13 VITALS — BP 139/84 | HR 59 | Resp 16 | Ht 61.0 in

## 2021-05-13 DIAGNOSIS — E119 Type 2 diabetes mellitus without complications: Secondary | ICD-10-CM

## 2021-05-13 DIAGNOSIS — I6523 Occlusion and stenosis of bilateral carotid arteries: Secondary | ICD-10-CM | POA: Diagnosis not present

## 2021-05-13 DIAGNOSIS — E785 Hyperlipidemia, unspecified: Secondary | ICD-10-CM | POA: Diagnosis not present

## 2021-05-13 DIAGNOSIS — I10 Essential (primary) hypertension: Secondary | ICD-10-CM | POA: Diagnosis not present

## 2021-05-13 NOTE — Progress Notes (Signed)
? ?Subjective:  ? ? Patient ID: Danielle Paul, female    DOB: 04/10/1955, 66 y.o.   MRN: [id number] ?Chief Complaint  ?Patient presents with  ? Follow-up  ?   ?ultrasound  ? ? ?Danielle FillersDoris Paul is a 66 year old female that presents today for follow-up evaluation of her chronic stenosis.  Patient had previous stroke and continues to have right-sided hemiplegia.  However she denies any new strokelike symptoms.  She denies any chest pain shortness of breath.  She denies any open wounds or ulcerations.  Today noninvasive studies show known occlusion of the right ICA.  It is also a 1 to 39% stenosis of the left ICA that is near the higher end ? ? ?Review of Systems  ?Neurological:  Positive for weakness.  ?All other systems reviewed and are negative. ? ?   ?Objective:  ? Physical Exam ?Vitals reviewed.  ?HENT:  ?   Head: Normocephalic.  ?Cardiovascular:  ?   Rate and Rhythm: Normal rate.  ?   Pulses: Normal pulses.  ?Pulmonary:  ?   Effort: Pulmonary effort is normal.  ?Skin: ?   General: Skin is warm and dry.  ?Neurological:  ?   Mental Status: She is alert and oriented to person, place, and time.  ?   Motor: Weakness present.  ?   Gait: Gait abnormal.  ?Psychiatric:     ?   Mood and Affect: Mood normal.     ?   Behavior: Behavior normal.     ?   Thought Content: Thought content normal.     ?   Judgment: Judgment normal.  ? ? ?BP 139/84 (BP Location: Left Arm)   Pulse (!) 59   Resp 16   Ht 5\' 1"  (1.549 m)   BMI 30.78 kg/m?  ? ?Past Medical History:  ?Diagnosis Date  ? Anxiety   ? Aortic valve disease   ? Coronary artery disease   ? CVA (cerebral vascular accident) [facility](HCC)   ? Diabetes mellitus without complication (HCC)   ? Hypercholesteremia   ? Hypertension   ? ? ?Social History  ? ?Socioeconomic History  ? Marital status: Unknown  ?  Spouse name: Not on file  ? Number of children: Not on file  ? Years of education: Not on file  ? Highest education level: Not on file  ?Occupational History  ? Not on file  ?Tobacco Use  ?  Smoking status: Never  ? Smokeless tobacco: Never  ?Substance and Sexual Activity  ? Alcohol use: Never  ? Drug use: Never  ? Sexual activity: Not on file  ?Other Topics Concern  ? Not on file  ?Social History Narrative  ? Not on file  ? ?Social Determinants of Health  ? ?Financial Resource Strain: Not on file  ?Food Insecurity: Not on file  ?Transportation Needs: Not on file  ?Physical Activity: Not on file  ?Stress: Not on file  ?Social Connections: Not on file  ?Intimate Partner Violence: Not on file  ? ? ?Past Surgical History:  ?Procedure Laterality Date  ? AORTIC VALVE REPLACEMENT (AVR)/CORONARY ARTERY BYPASS GRAFTING (CABG)  2009  ? CAROTID ANGIOGRAPHY N/A 03/21/2020  ? Procedure: CAROTID ANGIOGRAPHY;  Surgeon: Annice Needyew, Jason S, MD;  Location: [facility] INVASIVE CV LAB;  Service: Cardiovascular;  Laterality: N/A;  ? ? ?Family History  ?Problem Relation Age of Onset  ? Other Mother   ? Hypertension Mother   ? Hypertension Father   ? ? ?Allergies  ?Allergen Reactions  ? Atorvastatin Other (  See Comments)  ?  Muscle Pains  ? Isosorbide Nitrate Other (See Comments)  ?  Jittery and lightheadedness.  ? Pravastatin Other (See Comments)  ? Simvastatin Other (See Comments)  ?  Muscle Pains  ? ? ?CBC Latest Ref Rng & Units 02/04/2020 02/01/2020  ?WBC 4.0 - 10.5 K/uL 5.8 6.3  ?Hemoglobin 12.0 - 15.0 g/dL 11.1(L) 12.6  ?Hematocrit 36.0 - 46.0 % 34.1(L) 38.1  ?Platelets 150 - 400 K/uL 289 331  ? ? ? ? ?CMP  ?   ?Component Value Date/Time  ? NA 134 (L) 02/06/2020 0601  ? K 4.7 02/06/2020 0601  ? CL 105 02/06/2020 0601  ? CO2 22 02/06/2020 0601  ? GLUCOSE 107 (H) 02/06/2020 0601  ? BUN 14 03/21/2020 0821  ? CREATININE 1.72 (H) 03/21/2020 3532  ? CALCIUM 8.9 02/06/2020 0601  ? PROT 8.0 02/01/2020 2344  ? ALBUMIN 4.1 02/01/2020 2344  ? AST 23 02/01/2020 2344  ? ALT 21 02/01/2020 2344  ? ALKPHOS 76 02/01/2020 2344  ? BILITOT 0.5 02/01/2020 2344  ? GFRNONAA 33 (L) 03/21/2020 9924  ? ? ? ?No results found. ? ?   ?Assessment & Plan:  ? ?1.  Bilateral carotid artery stenosis ?Recommend: ? ?Given the patient's asymptomatic subcritical stenosis no further invasive testing or surgery at this time. ? ?Duplex ultrasound shows known occlusion of the right ICA with 1 to 39% stenosis of left ICA ? ?Continue antiplatelet therapy as prescribed ?Continue management of CAD, HTN and Hyperlipidemia ?Healthy heart diet,  encouraged exercise at least 4 times per week ?Follow up in 6 months with duplex ultrasound and physical exam   ? ?2. Primary hypertension ?Continue antihypertensive medications as already ordered, these medications have been reviewed and there are no changes at this time.  ? ?3. Diabetes mellitus without complication (HCC) ?Continue hypoglycemic medications as already ordered, these medications have been reviewed and there are no changes at this time. ? ?Hgb A1C to be monitored as already arranged by primary service  ? ?4. Hyperlipidemia, unspecified hyperlipidemia type ?Continue statin as ordered and reviewed, no changes at this time  ? ? ?Current Outpatient Medications on File Prior to Visit  ?Medication Sig Dispense Refill  ? ACCU-CHEK GUIDE test strip     ? Accu-Chek Softclix Lancets lancets SMARTSIG:Topical    ? Ascorbic Acid (VITAMIN C) 100 MG tablet     ? aspirin 81 MG EC tablet Take 81 mg by mouth daily.    ? B-D ULTRAFINE III SHORT PEN 31G X 8 MM MISC Inject into the skin.    ? carvedilol (COREG) 25 MG tablet Take 25 mg by mouth 2 (two) times daily.    ? Cholecalciferol 1.25 MG (50000 UT) capsule cholecalciferol (vitamin D3) 1,250 mcg (50,000 unit) capsule ? TAKE 1 CAPSULE BY MOUTH ONCE A WEEK    ? clopidogrel (PLAVIX) 75 MG tablet Take 1 tablet (75 mg total) by mouth daily. 30 tablet 0  ? insulin lispro (HUMALOG) 100 UNIT/ML injection Inject 3-9 Units into the skin 3 (three) times daily before meals. (based upon blood glucose reading)    ? LANTUS SOLOSTAR 100 UNIT/ML Solostar Pen Inject 35 Units into the skin at bedtime.    ? nitroGLYCERIN  (NITROSTAT) 0.3 MG SL tablet     ? rosuvastatin (CRESTOR) 40 MG tablet Take 40 mg by mouth daily.    ? spironolactone (ALDACTONE) 25 MG tablet Take 1 tablet by mouth daily.    ? FLUoxetine (PROZAC) 10 MG tablet Take  10 mg by mouth daily. (Patient not taking: Reported on 05/13/2021)    ? polyethylene glycol (MIRALAX / GLYCOLAX) 17 g packet Take 17 g by mouth daily. (Patient not taking: Reported on 05/13/2021) 14 each 0  ? ?No current facility-administered medications on file prior to visit.  ? ? ?There are no Patient Instructions on file for this visit. ?No follow-ups on file. ? ? ?Georgiana Spinner, NP ? ? ?

## 2021-05-23 DIAGNOSIS — I509 Heart failure, unspecified: Secondary | ICD-10-CM | POA: Diagnosis not present

## 2021-05-23 DIAGNOSIS — I130 Hypertensive heart and chronic kidney disease with heart failure and stage 1 through stage 4 chronic kidney disease, or unspecified chronic kidney disease: Secondary | ICD-10-CM | POA: Diagnosis not present

## 2021-05-23 DIAGNOSIS — N189 Chronic kidney disease, unspecified: Secondary | ICD-10-CM | POA: Diagnosis not present

## 2021-06-20 DIAGNOSIS — H2511 Age-related nuclear cataract, right eye: Secondary | ICD-10-CM | POA: Diagnosis not present

## 2021-06-20 DIAGNOSIS — Z0100 Encounter for examination of eyes and vision without abnormal findings: Secondary | ICD-10-CM | POA: Diagnosis not present

## 2021-06-20 DIAGNOSIS — E113211 Type 2 diabetes mellitus with mild nonproliferative diabetic retinopathy with macular edema, right eye: Secondary | ICD-10-CM | POA: Diagnosis not present

## 2021-06-22 DIAGNOSIS — I509 Heart failure, unspecified: Secondary | ICD-10-CM | POA: Diagnosis not present

## 2021-06-22 DIAGNOSIS — N189 Chronic kidney disease, unspecified: Secondary | ICD-10-CM | POA: Diagnosis not present

## 2021-06-22 DIAGNOSIS — I130 Hypertensive heart and chronic kidney disease with heart failure and stage 1 through stage 4 chronic kidney disease, or unspecified chronic kidney disease: Secondary | ICD-10-CM | POA: Diagnosis not present

## 2021-06-26 ENCOUNTER — Encounter (INDEPENDENT_AMBULATORY_CARE_PROVIDER_SITE_OTHER): Payer: Medicare HMO

## 2021-06-26 ENCOUNTER — Ambulatory Visit (INDEPENDENT_AMBULATORY_CARE_PROVIDER_SITE_OTHER): Payer: Medicare HMO | Admitting: Nurse Practitioner

## 2021-07-03 DIAGNOSIS — E119 Type 2 diabetes mellitus without complications: Secondary | ICD-10-CM | POA: Diagnosis not present

## 2021-07-03 DIAGNOSIS — E782 Mixed hyperlipidemia: Secondary | ICD-10-CM | POA: Diagnosis not present

## 2021-07-03 DIAGNOSIS — I10 Essential (primary) hypertension: Secondary | ICD-10-CM | POA: Diagnosis not present

## 2021-07-03 DIAGNOSIS — I63133 Cerebral infarction due to embolism of bilateral carotid arteries: Secondary | ICD-10-CM | POA: Diagnosis not present

## 2021-07-10 DIAGNOSIS — E1165 Type 2 diabetes mellitus with hyperglycemia: Secondary | ICD-10-CM | POA: Diagnosis not present

## 2021-07-29 DIAGNOSIS — E113211 Type 2 diabetes mellitus with mild nonproliferative diabetic retinopathy with macular edema, right eye: Secondary | ICD-10-CM | POA: Diagnosis not present

## 2021-07-29 DIAGNOSIS — H26492 Other secondary cataract, left eye: Secondary | ICD-10-CM | POA: Diagnosis not present

## 2021-07-29 DIAGNOSIS — H2511 Age-related nuclear cataract, right eye: Secondary | ICD-10-CM | POA: Diagnosis not present

## 2021-08-05 DIAGNOSIS — H2511 Age-related nuclear cataract, right eye: Secondary | ICD-10-CM | POA: Diagnosis not present

## 2021-08-13 ENCOUNTER — Encounter: Payer: Self-pay | Admitting: Ophthalmology

## 2021-08-14 ENCOUNTER — Encounter: Payer: Self-pay | Admitting: Anesthesiology

## 2021-08-14 ENCOUNTER — Encounter: Payer: Self-pay | Admitting: Ophthalmology

## 2021-08-21 ENCOUNTER — Encounter
Admission: RE | Disposition: A | Discharge status: Home / Self Care | Payer: Self-pay | Source: Home / Self Care | Attending: Ophthalmology

## 2021-08-21 SURGERY — PHACOEMULSIFICATION, CATARACT, WITH IOL INSERTION
Anesthesia: Topical | Laterality: Right

## 2021-08-22 DIAGNOSIS — I509 Heart failure, unspecified: Secondary | ICD-10-CM | POA: Diagnosis not present

## 2021-08-22 DIAGNOSIS — N189 Chronic kidney disease, unspecified: Secondary | ICD-10-CM | POA: Diagnosis not present

## 2021-08-22 DIAGNOSIS — I130 Hypertensive heart and chronic kidney disease with heart failure and stage 1 through stage 4 chronic kidney disease, or unspecified chronic kidney disease: Secondary | ICD-10-CM | POA: Diagnosis not present

## 2021-09-04 DIAGNOSIS — I63133 Cerebral infarction due to embolism of bilateral carotid arteries: Secondary | ICD-10-CM | POA: Diagnosis not present

## 2021-09-04 DIAGNOSIS — M8589 Other specified disorders of bone density and structure, multiple sites: Secondary | ICD-10-CM | POA: Diagnosis not present

## 2021-09-04 DIAGNOSIS — I10 Essential (primary) hypertension: Secondary | ICD-10-CM | POA: Diagnosis not present

## 2021-09-04 DIAGNOSIS — E782 Mixed hyperlipidemia: Secondary | ICD-10-CM | POA: Diagnosis not present

## 2021-09-04 DIAGNOSIS — E119 Type 2 diabetes mellitus without complications: Secondary | ICD-10-CM | POA: Diagnosis not present

## 2021-09-08 ENCOUNTER — Other Ambulatory Visit: Payer: Self-pay | Admitting: Internal Medicine

## 2021-09-08 DIAGNOSIS — Z1231 Encounter for screening mammogram for malignant neoplasm of breast: Secondary | ICD-10-CM

## 2021-09-22 ENCOUNTER — Ambulatory Visit: Payer: Medicare Other | Admitting: Physical Therapy

## 2021-09-22 DIAGNOSIS — I130 Hypertensive heart and chronic kidney disease with heart failure and stage 1 through stage 4 chronic kidney disease, or unspecified chronic kidney disease: Secondary | ICD-10-CM | POA: Diagnosis not present

## 2021-09-22 DIAGNOSIS — I509 Heart failure, unspecified: Secondary | ICD-10-CM | POA: Diagnosis not present

## 2021-09-22 DIAGNOSIS — N189 Chronic kidney disease, unspecified: Secondary | ICD-10-CM | POA: Diagnosis not present

## 2021-09-30 ENCOUNTER — Encounter: Payer: Self-pay | Admitting: Anesthesiology

## 2021-09-30 ENCOUNTER — Encounter: Payer: Self-pay | Admitting: Ophthalmology

## 2021-10-01 ENCOUNTER — Ambulatory Visit
Admission: RE | Admit: 2021-10-01 | Discharge: 2021-10-01 | Disposition: A | Discharge status: Home / Self Care | Payer: Medicare Other | Source: Ambulatory Visit | Attending: Internal Medicine | Admitting: Internal Medicine

## 2021-10-01 DIAGNOSIS — Z1231 Encounter for screening mammogram for malignant neoplasm of breast: Secondary | ICD-10-CM | POA: Insufficient documentation

## 2021-10-01 NOTE — Anesthesia Preprocedure Evaluation (Addendum)
Anesthesia Evaluation  Patient identified by MRN, date of birth, ID band Patient awake    Reviewed: Allergy & Precautions, NPO status , Patient's Chart, lab work & pertinent test results, reviewed documented beta blocker date and time   Airway Mallampati: III  TM Distance: >3 FB Neck ROM: full    Dental  (+) Chipped   Pulmonary neg pulmonary ROS,    Pulmonary exam normal        Cardiovascular hypertension, Pt. on medications and Pt. on home beta blockers + CAD and + CABG (2009)  Normal cardiovascular exam  AVR with CABG 2009   Neuro/Psych PSYCHIATRIC DISORDERS Anxiety Depression CVA (2021 Left-sided weakness), Residual Symptoms    GI/Hepatic negative GI ROS, Neg liver ROS,   Endo/Other  diabetes, Poorly Controlled, Type 2, Insulin Dependent  Renal/GU CRFRenal disease  negative genitourinary   Musculoskeletal  (+) Arthritis ,   Abdominal (+) + obese,   Peds  Hematology  (+) Blood dyscrasia, anemia ,   Anesthesia Other Findings Past Medical History: No date: Anxiety No date: Coronary artery disease 01/2020: CVA (cerebral vascular accident) ([place]) No date: Diabetes mellitus without complication ([place]) No date: Hypercholesteremia No date: Hypertension No date: Left-sided weakness     Comment:  S/P stroke  Past Surgical History: 2009: AORTIC VALVE REPLACEMENT (AVR)/CORONARY ARTERY BYPASS GRAFTING  (CABG) 03/21/2020: CAROTID ANGIOGRAPHY; N/A     Comment:  Procedure: CAROTID ANGIOGRAPHY;  Surgeon: Algernon Huxley,               MD;  Location: [facility];  Service:               Cardiovascular;  Laterality: N/A;  BMI    Body Mass Index: 34.01 kg/m      Reproductive/Obstetrics negative OB ROS                             Anesthesia Physical Anesthesia Plan  ASA:   Anesthesia Plan: MAC   Post-op Pain Management: Minimal or no pain anticipated   Induction: Intravenous  PONV  Risk Score and Plan: TIVA and Treatment may vary due to age or medical condition  Airway Management Planned: Natural Airway and Nasal Cannula  Additional Equipment:   Intra-op Plan:   Post-operative Plan:   Informed Consent:     Dental Advisory Given  Plan Discussed with: Anesthesiologist, CRNA and Surgeon  Anesthesia Plan Comments: (Hypertensive urgency 379 systolic with no symptoms of end organ damage or ICP. Treated and discharged to living facility with PCP follow up. )       Anesthesia Quick Evaluation

## 2021-10-01 NOTE — Discharge Instructions (Signed)

## 2021-10-02 ENCOUNTER — Other Ambulatory Visit: Payer: Self-pay | Admitting: *Deleted

## 2021-10-02 ENCOUNTER — Inpatient Hospital Stay
Admission: RE | Admit: 2021-10-02 | Discharge: 2021-10-02 | Disposition: A | Discharge status: Home / Self Care | Payer: Self-pay | Source: Ambulatory Visit | Attending: *Deleted | Admitting: *Deleted

## 2021-10-02 DIAGNOSIS — Z1231 Encounter for screening mammogram for malignant neoplasm of breast: Secondary | ICD-10-CM

## 2021-10-06 ENCOUNTER — Ambulatory Visit: Admission: RE | Admit: 2021-10-06 | Payer: Medicare Other | Source: Home / Self Care | Admitting: Ophthalmology

## 2021-10-06 ENCOUNTER — Encounter
Admission: RE | Disposition: A | Discharge status: Home / Self Care | Payer: Self-pay | Source: Home / Self Care | Attending: Ophthalmology

## 2021-10-06 ENCOUNTER — Ambulatory Visit
Admission: RE | Admit: 2021-10-06 | Discharge: 2021-10-06 | Disposition: A | Discharge status: Home / Self Care | Payer: Medicare Other | Attending: Ophthalmology | Admitting: Ophthalmology

## 2021-10-06 DIAGNOSIS — E119 Type 2 diabetes mellitus without complications: Secondary | ICD-10-CM

## 2021-10-06 DIAGNOSIS — Z01818 Encounter for other preprocedural examination: Secondary | ICD-10-CM | POA: Diagnosis not present

## 2021-10-06 DIAGNOSIS — Z538 Procedure and treatment not carried out for other reasons: Secondary | ICD-10-CM | POA: Diagnosis not present

## 2021-10-06 HISTORY — DX: Weakness: R53.1

## 2021-10-06 LAB — GLUCOSE, CAPILLARY: Glucose-Capillary: 121 mg/dL — ABNORMAL HIGH (ref 70–99)

## 2021-10-06 SURGERY — PHACOEMULSIFICATION, CATARACT, WITH IOL INSERTION
Anesthesia: Monitor Anesthesia Care | Laterality: Right

## 2021-10-06 MED ORDER — HYDRALAZINE HCL 20 MG/ML IJ SOLN
10.0000 mg | Freq: Once | INTRAMUSCULAR | Status: AC
  Administered 2021-10-06: 10 mg via INTRAVENOUS

## 2021-10-06 MED ORDER — TETRACAINE HCL 0.5 % OP SOLN: 1.0000 [drp] | OPHTHALMIC | Status: DC | PRN

## 2021-10-06 MED ORDER — ARMC OPHTHALMIC DILATING DROPS: 1.0000 | OPHTHALMIC | Status: DC | PRN

## 2021-10-06 MED ORDER — LABETALOL HCL 5 MG/ML IV SOLN
10.0000 mg | INTRAVENOUS | Status: DC | PRN
  Administered 2021-10-06 (×2): 10 mg via INTRAVENOUS

## 2021-10-06 SURGICAL SUPPLY — 17 items
CANNULA ANT/CHMB 27GA (MISCELLANEOUS) IMPLANT
CATARACT SUITE SIGHTPATH (MISCELLANEOUS) ×2 IMPLANT
GLOVE SRG 8 PF TXTR STRL LF DI (GLOVE) ×1 IMPLANT
GLOVE SURG ENC TEXT LTX SZ7.5 (GLOVE) ×2 IMPLANT
GLOVE SURG GAMMEX PI TX LF 7.5 (GLOVE) IMPLANT
GLOVE SURG UNDER POLY LF SZ8 (GLOVE) ×1
NDL RETROBULBAR .5 NSTRL (NEEDLE) IMPLANT
NEEDLE FILTER BLUNT 18X 1/2SAF (NEEDLE) ×1
NEEDLE FILTER BLUNT 18X1 1/2 (NEEDLE) ×1 IMPLANT
PACK VIT ANT 23G (MISCELLANEOUS) IMPLANT
RING MALYGIN 7.0 (MISCELLANEOUS) IMPLANT
SUT ETHILON 10-0 CS-B-6CS-B-6 (SUTURE)
SUT VICRYL  9 0 (SUTURE)
SUT VICRYL 9 0 (SUTURE) IMPLANT
SUTURE EHLN 10-0 CS-B-6CS-B-6 (SUTURE) IMPLANT
SYR 3ML LL SCALE MARK (SYRINGE) ×2 IMPLANT
WATER STERILE IRR 250ML POUR (IV SOLUTION) ×2 IMPLANT

## 2021-10-06 NOTE — H&P (Signed)
  Surgery canceled for elevated BP.

## 2021-10-06 NOTE — Progress Notes (Signed)
Patient's procedure cancelled in pre-op due to high blood pressure recording.

## 2021-10-09 ENCOUNTER — Ambulatory Visit: Payer: Medicare Other | Attending: Family | Admitting: Physical Therapy

## 2021-10-09 DIAGNOSIS — M6281 Muscle weakness (generalized): Secondary | ICD-10-CM | POA: Insufficient documentation

## 2021-10-09 NOTE — Therapy (Signed)
[facility] [address] [place], [place], [address] Phone: [phone]   Fax:  [phone]  Patient Details  Name: Danielle Paul MRN: [id number] Date of Birth: 01/25/1956 Referring Provider:  Miki Kins, FNP  Encounter Date: 10/09/2021  Patient arrived to clinic via [facility] transport in W/C for evaluation of symptoms related to history of stroke. Patient had surgery cancelled on 10/06/2021 due to high blood pressure (systolic 220 per chart review). Patient was advised at surgery to follow-up with PCP regarding blood pressure. On arrival, patient stated that she has not yet had appointment with PCP regarding blood pressure. In light of her medical history, we agreed to defer outpatient physical therapy evaluation until we have medical clearance that patient is safe to participate in new exercise program.   Sheria Lang PT, DPT [phone]  10/09/2021, 11:04 AM  [facility] [address] [place], [place], [address] Phone: [phone]   Fax:  [phone]

## 2021-10-13 ENCOUNTER — Encounter: Payer: Medicare Other | Admitting: Physical Therapy

## 2021-10-13 DIAGNOSIS — I10 Essential (primary) hypertension: Secondary | ICD-10-CM | POA: Diagnosis not present

## 2021-10-13 DIAGNOSIS — E782 Mixed hyperlipidemia: Secondary | ICD-10-CM | POA: Diagnosis not present

## 2021-10-13 DIAGNOSIS — E119 Type 2 diabetes mellitus without complications: Secondary | ICD-10-CM | POA: Diagnosis not present

## 2021-10-13 DIAGNOSIS — R252 Cramp and spasm: Secondary | ICD-10-CM | POA: Diagnosis not present

## 2021-10-13 DIAGNOSIS — I69393 Ataxia following cerebral infarction: Secondary | ICD-10-CM | POA: Diagnosis not present

## 2021-10-14 ENCOUNTER — Other Ambulatory Visit: Payer: Self-pay

## 2021-10-14 NOTE — Patient Outreach (Signed)
[facility]) Care Management  10/14/2021  Tamikka Pilger 1955-07-25 [id number]   Telephone Screen    Outreach call to patient to introduce [facility] services and assess care needs as part of benefit of PCP office and insurance plan. No answer. RN CM left HIPAA compliant voicemail message along with contact info.     Plan: RN CM will make outreach attempt to patient within 4 business days.   Antionette Fairy, RN,BSN,CCM [facility] Care Management Telephonic Care Management Coordinator Direct Phone: [phone] Toll Free: [phone] Fax: [phone]

## 2021-10-16 ENCOUNTER — Encounter: Payer: Medicare Other | Admitting: Physical Therapy

## 2021-10-17 ENCOUNTER — Other Ambulatory Visit: Payer: Self-pay

## 2021-10-17 NOTE — Patient Outreach (Signed)
[facility]) Care Management  10/17/2021  Danielle Paul 1955/04/24 [id number]   Telephone Screen       Unsuccessful outreach call to patient to introduce [facility] services and assess care needs as part of benefit of PCP office and insurance plan.       Plan: RN CM will make outreach attempt to patient within 4 business days.  Antionette Fairy, RN,BSN,CCM [facility] Care Management Telephonic Care Management Coordinator Direct Phone: [phone] Toll Free: [phone] Fax: [phone]

## 2021-10-17 NOTE — Patient Outreach (Signed)
[facility]) Care Management  10/17/2021  Aliveah Gallant 22-Jan-1956 [id number]   Telephone Screen       Incoming call from patient returning RN CM to introduce [facility] services and assess care needs as part of benefit of PCP office and insurance plan. She confirms she has [employer] & Medicaid. Advised patient that she is eligible for CM services through insurance.    Main healthcare issue/concern today: Patient states she is waiting for medical clearance on outpatient therapy.She has MD appt earlier this week and awaiting to hear back. Patient wants to start seeing DM specialist and will talk with MD. Patient denies any issues with transportation-uses [facility] as well as [employer]. She gets her meds free of charge.   Health Maintenance/Care Gaps: -Last AWV: Not on file.Educated patient on importance of exam and encouraged to make an appt.       Antionette Fairy, RN,BSN,CCM [facility] Care Management Telephonic Care Management Coordinator Direct Phone: [phone] Toll Free: [phone] Fax: [phone]

## 2021-10-20 ENCOUNTER — Encounter: Payer: Medicare Other | Admitting: Physical Therapy

## 2021-10-23 ENCOUNTER — Encounter: Payer: Medicare Other | Admitting: Physical Therapy

## 2021-10-23 DIAGNOSIS — N189 Chronic kidney disease, unspecified: Secondary | ICD-10-CM | POA: Diagnosis not present

## 2021-10-23 DIAGNOSIS — I509 Heart failure, unspecified: Secondary | ICD-10-CM | POA: Diagnosis not present

## 2021-10-23 DIAGNOSIS — I130 Hypertensive heart and chronic kidney disease with heart failure and stage 1 through stage 4 chronic kidney disease, or unspecified chronic kidney disease: Secondary | ICD-10-CM | POA: Diagnosis not present

## 2021-10-30 ENCOUNTER — Encounter: Payer: Medicare Other | Admitting: Physical Therapy

## 2021-11-03 ENCOUNTER — Encounter: Payer: Medicare Other | Admitting: Physical Therapy

## 2021-11-03 DIAGNOSIS — M79675 Pain in left toe(s): Secondary | ICD-10-CM | POA: Diagnosis not present

## 2021-11-03 DIAGNOSIS — B351 Tinea unguium: Secondary | ICD-10-CM | POA: Diagnosis not present

## 2021-11-03 DIAGNOSIS — M79674 Pain in right toe(s): Secondary | ICD-10-CM | POA: Diagnosis not present

## 2021-11-03 DIAGNOSIS — E1142 Type 2 diabetes mellitus with diabetic polyneuropathy: Secondary | ICD-10-CM | POA: Diagnosis not present

## 2021-11-06 ENCOUNTER — Encounter: Payer: Medicare Other | Admitting: Physical Therapy

## 2021-11-10 ENCOUNTER — Encounter: Payer: Medicare Other | Admitting: Physical Therapy

## 2021-11-12 ENCOUNTER — Other Ambulatory Visit (INDEPENDENT_AMBULATORY_CARE_PROVIDER_SITE_OTHER): Payer: Self-pay | Admitting: Nurse Practitioner

## 2021-11-12 DIAGNOSIS — I6521 Occlusion and stenosis of right carotid artery: Secondary | ICD-10-CM

## 2021-11-13 ENCOUNTER — Encounter (INDEPENDENT_AMBULATORY_CARE_PROVIDER_SITE_OTHER): Payer: Medicare HMO

## 2021-11-13 ENCOUNTER — Ambulatory Visit (INDEPENDENT_AMBULATORY_CARE_PROVIDER_SITE_OTHER): Payer: Medicare HMO | Admitting: Nurse Practitioner

## 2021-11-17 ENCOUNTER — Encounter: Payer: Medicare Other | Admitting: Physical Therapy

## 2021-11-20 ENCOUNTER — Encounter: Payer: Medicare Other | Admitting: Physical Therapy

## 2021-11-22 DIAGNOSIS — N189 Chronic kidney disease, unspecified: Secondary | ICD-10-CM | POA: Diagnosis not present

## 2021-11-22 DIAGNOSIS — I130 Hypertensive heart and chronic kidney disease with heart failure and stage 1 through stage 4 chronic kidney disease, or unspecified chronic kidney disease: Secondary | ICD-10-CM | POA: Diagnosis not present

## 2021-11-22 DIAGNOSIS — I509 Heart failure, unspecified: Secondary | ICD-10-CM | POA: Diagnosis not present

## 2021-11-24 ENCOUNTER — Ambulatory Visit: Payer: Medicare Other | Admitting: Physical Therapy

## 2021-11-27 ENCOUNTER — Encounter: Payer: Medicare Other | Admitting: Physical Therapy

## 2021-12-01 ENCOUNTER — Encounter: Payer: Medicare Other | Admitting: Physical Therapy

## 2021-12-04 ENCOUNTER — Encounter: Payer: Medicare Other | Admitting: Physical Therapy

## 2021-12-08 ENCOUNTER — Encounter: Payer: Medicare Other | Admitting: Physical Therapy

## 2021-12-11 ENCOUNTER — Encounter: Payer: Medicare Other | Admitting: Physical Therapy

## 2021-12-15 ENCOUNTER — Encounter: Payer: Medicare Other | Admitting: Physical Therapy

## 2021-12-18 ENCOUNTER — Encounter: Payer: Medicare Other | Admitting: Physical Therapy

## 2021-12-22 ENCOUNTER — Encounter: Payer: Medicare Other | Admitting: Physical Therapy

## 2021-12-22 ENCOUNTER — Ambulatory Visit: Payer: Medicare Other

## 2021-12-23 DIAGNOSIS — N189 Chronic kidney disease, unspecified: Secondary | ICD-10-CM | POA: Diagnosis not present

## 2021-12-23 DIAGNOSIS — I509 Heart failure, unspecified: Secondary | ICD-10-CM | POA: Diagnosis not present

## 2021-12-23 DIAGNOSIS — I130 Hypertensive heart and chronic kidney disease with heart failure and stage 1 through stage 4 chronic kidney disease, or unspecified chronic kidney disease: Secondary | ICD-10-CM | POA: Diagnosis not present

## 2021-12-25 ENCOUNTER — Encounter: Payer: Medicare Other | Admitting: Physical Therapy

## 2021-12-29 ENCOUNTER — Ambulatory Visit: Payer: Medicare Other | Attending: Family

## 2021-12-29 DIAGNOSIS — R262 Difficulty in walking, not elsewhere classified: Secondary | ICD-10-CM | POA: Diagnosis not present

## 2021-12-29 DIAGNOSIS — G8929 Other chronic pain: Secondary | ICD-10-CM | POA: Diagnosis not present

## 2021-12-29 DIAGNOSIS — R531 Weakness: Secondary | ICD-10-CM | POA: Diagnosis not present

## 2021-12-29 DIAGNOSIS — M6281 Muscle weakness (generalized): Secondary | ICD-10-CM | POA: Diagnosis not present

## 2021-12-29 DIAGNOSIS — M25562 Pain in left knee: Secondary | ICD-10-CM | POA: Insufficient documentation

## 2021-12-29 NOTE — Therapy (Signed)
OUTPATIENT PHYSICAL THERAPY NEURO EVALUATION   Patient Name: Danielle Paul MRN: [id number] DOB:[birth year], 66 y.o., female Today's Date: 12/29/2021   PCP: Perrin Maltese, MD REFERRING PROVIDER: Mechele Claude, FNP   PT End of Session - 12/29/21 1157     Visit Number 1    Number of Visits 24    Date for PT Re-Evaluation 03/23/22    Progress Note Due on Visit 10    PT Start Time 1150    PT Stop Time 1230    PT Time Calculation (min) 40 min    Equipment Utilized During Treatment Gait belt             Past Medical History:  Diagnosis Date   Anxiety    Coronary artery disease    CVA (cerebral vascular accident) ([place]) 01/2020   Diabetes mellitus without complication ([place])    Hypercholesteremia    Hypertension    Left-sided weakness    S/P stroke   Past Surgical History:  Procedure Laterality Date   AORTIC VALVE REPLACEMENT (AVR)/CORONARY ARTERY BYPASS GRAFTING (CABG)  2009   CAROTID ANGIOGRAPHY N/A 03/21/2020   Procedure: CAROTID ANGIOGRAPHY;  Surgeon: Algernon Huxley, MD;  Location: [facility];  Service: Cardiovascular;  Laterality: N/A;   Patient Active Problem List   Diagnosis Date Noted   Carotid stenosis 03/05/2020   CVA (cerebral vascular accident) ([place]) 02/03/2020   TIA (transient ischemic attack) 02/02/2020   AKI (acute kidney injury) ([place]) 02/02/2020   Acute lower UTI 02/02/2020   Diabetes mellitus without complication ([place])    Hypertension    CKD (chronic kidney disease) stage 3, GFR 30-59 ml/min (HCC) 01/09/2016   Iron deficiency anemia 40/98/1191   Systolic heart failure, chronic (HCC) 03/28/2014   Obesity (BMI 30-39.9) 11/26/2012   Primary osteoarthritis of right hand 02/29/2012   Diabetic retinopathy associated with type 2 diabetes mellitus ([place]) 06/17/2011   Esophageal dysmotility 06/17/2011   CAD (coronary artery disease), native coronary artery 10/01/2010   Cataracts, bilateral 10/01/2010   Hyperlipidemia 10/01/2010   Depression  09/30/2010   Sarcoidosis of lung ([place]) 09/30/2010    ONSET DATE: 01/2020 CVA  REFERRING DIAG: Muscle weakness, Stroke  THERAPY DIAG:  Difficulty in walking, not elsewhere classified  Muscle weakness (generalized)  Rationale for Evaluation and Treatment: Rehabilitation  SUBJECTIVE:                                                                                                                                                                                             SUBJECTIVE STATEMENT: Pt reports she had a stroke 2 years ago and had received  limited therapy since then.  Pt notes she has not walked in 2 months, the last being inside of her home and she ambulated without an AD or without someone and it was scary.  Pt states she is able to perform transfers at home with the use of the grab bars in the bathroom, but otherwise she needs assistance from husband for all other forms of transfers at this time.    Pt accompanied by: self  PERTINENT HISTORY: Danielle Paul is a 66 y.o. with a complaint of imbalance and LE weakness s/p CVA in 2021.  Pt has PMH including: CAD, cataracts, depression, diabetic retinopathy due Type 2 diabetes mellitus, DM2, and HTN.  PAIN:  Are you having pain? No  PRECAUTIONS: None  WEIGHT BEARING RESTRICTIONS: No  FALLS: Has patient fallen in last 6 months? Yes. Number of falls 5-6  LIVING ENVIRONMENT: Lives with: lives with their spouse Lives in: House/apartment Stairs: Yes: Internal: 13 steps; pt has a chair to take her upstairs Has following equipment at home: Single point cane, Walker - 4 wheeled, Hemi walker, Wheelchair (manual), shower chair, bed side commode, Grab bars, and Ramped entry  PLOF: Needs assistance with ADLs, Needs assistance with homemaking, Needs assistance with gait, and Needs assistance with transfers  PATIENT GOALS: Return to PLOF and be able to walk again and be independent.  OBJECTIVE:   DIAGNOSTIC FINDINGS: N/A  BP: 157/67  mmHg; HR: 66  COGNITION: Overall cognitive status: Within functional limits for tasks assessed.   SENSATION: WFL  COORDINATION: WFL  POSTURE: rounded shoulders, forward head, increased lumbar lordosis, and weight shift left  LOWER EXTREMITY ROM:     Active  Right Eval Left Eval  Hip flexion    Hip extension    Hip abduction    Hip adduction    Hip internal rotation    Hip external rotation    Knee flexion    Knee extension    Ankle dorsiflexion    Ankle plantarflexion    Ankle inversion    Ankle eversion     (Blank rows = not tested)   LOWER EXTREMITY MMT:    MMT Right Eval Left Eval  Hip flexion 4- 3-  Hip extension    Hip abduction 4- 3-  Hip adduction 4- 3-  Hip internal rotation    Hip external rotation    Knee flexion 4- 3-  Knee extension 4- 3-  Ankle dorsiflexion 5 4-  Ankle plantarflexion    Ankle inversion    Ankle eversion    (Blank rows = not tested)  L LE unable to perform again gravity   TRANSFERS: Assistive device utilized: Hemi walker  Sit to stand: Mod A Stand to sit: Min A   FUNCTIONAL TESTS:  5 times sit to stand: Unable to perform at this time.  PATIENT SURVEYS:  FOTO 26/37  TODAY'S TREATMENT:  DATE: 12/29/21   Eval only.  PATIENT EDUCATION: Education details: Pt educated on role of PT and services provided during current POC, along with prognosis and information about the clinic.  Person educated: Patient Education method: Explanation Education comprehension: verbalized understanding  HOME EXERCISE PROGRAM: Give to pt at next visit.  GOALS: Goals reviewed with patient? Yes  SHORT TERM GOALS: Target date: 01/26/2022  Pt will be independent with HEP in order to demonstrate increased ability to perform tasks related to occupation/hobbies. Baseline:  Pt to be given HEP at next session Goal  status: INITIAL   LONG TERM GOALS: Target date: 03/23/2022  1.  Patient (> 10 years old) will complete one STS with modified independence indicating an increased LE strength and improved balance. Baseline: Unable to perform without modA from therapist Goal status: INITIAL  2.  Patient will increase FOTO score to equal to or greater than 37 to demonstrate statistically significant improvement in mobility and quality of life.  Baseline: FOTO: 26 Goal status: INITIAL   3.  Patient will increase MMT of B LE by 1/2 grade to demonstrate improved strength necessary for safe mobility/transfers. Baseline: Gross L LE: 3-/5 Goal status: INITIAL     ASSESSMENT:  CLINICAL IMPRESSION: Patient is a 66 y.o. female who was seen today for physical therapy evaluation and treatment for imbalance and generalized weakness s/p CVA affecting the L side.  Pt presents with physical impairments of decreased activity tolerance, decreased balance, and decreased strength in B LE as noted.  Pt will benefit from skilled therapy to address tolerance, balance, and strength impairments necessary for improvement in quality of life.  Pt. demonstrates understanding of this plan of care and agrees with this plan.    OBJECTIVE IMPAIRMENTS: Abnormal gait, decreased activity tolerance, decreased balance, decreased endurance, decreased knowledge of use of DME, decreased mobility, difficulty walking, decreased strength, decreased safety awareness, increased fascial restrictions, and obesity.   ACTIVITY LIMITATIONS: carrying, lifting, bending, standing, squatting, stairs, transfers, bed mobility, continence, bathing, toileting, dressing, reach over head, hygiene/grooming, and caring for others  PARTICIPATION LIMITATIONS: meal prep, cleaning, laundry, driving, shopping, community activity, and yard work  PERSONAL FACTORS: Fitness, Past/current experiences, Time since onset of injury/illness/exacerbation, Transportation, and 3+  comorbidities: Kidney disease, DM2, HTN  are also affecting patient's functional outcome.   REHAB POTENTIAL: Fair pt is 2 years s/p CVA  CLINICAL DECISION MAKING: Stable/uncomplicated  EVALUATION COMPLEXITY: Moderate  PLAN:  PT FREQUENCY: 2x/week  PT DURATION: 12 weeks  PLANNED INTERVENTIONS: Therapeutic exercises, Therapeutic activity, Neuromuscular re-education, Balance training, Gait training, Patient/Family education, Self Care, Joint mobilization, Dry Needling, Wheelchair mobility training, and Manual therapy  PLAN FOR NEXT SESSION: Give HEP,  continue with assessment for balance level.  Perform transfers.   Nolon Bussing, PT, DPT Physical Therapist- [facility]  12/29/21, 4:58 PM

## 2021-12-31 NOTE — Addendum Note (Signed)
Addended by: Phineas Real on: 12/31/2021 05:57 PM   Modules accepted: Orders

## 2022-01-05 ENCOUNTER — Ambulatory Visit: Payer: Medicare Other

## 2022-01-05 DIAGNOSIS — R262 Difficulty in walking, not elsewhere classified: Secondary | ICD-10-CM | POA: Diagnosis not present

## 2022-01-05 DIAGNOSIS — M6281 Muscle weakness (generalized): Secondary | ICD-10-CM

## 2022-01-05 DIAGNOSIS — G8929 Other chronic pain: Secondary | ICD-10-CM | POA: Diagnosis not present

## 2022-01-05 DIAGNOSIS — M25562 Pain in left knee: Secondary | ICD-10-CM | POA: Diagnosis not present

## 2022-01-05 DIAGNOSIS — R531 Weakness: Secondary | ICD-10-CM | POA: Diagnosis not present

## 2022-01-05 NOTE — Therapy (Signed)
OUTPATIENT PHYSICAL THERAPY NEURO TREATMENT   Patient Name: Danielle Paul MRN: [id number] DOB:19-May-1955, 66 y.o., female Today's Date: 01/05/2022   PCP: Danielle Loveless, MD REFERRING PROVIDER: Miki Kins, FNP   PT End of Session - 01/05/22 1205     Visit Number 2    Number of Visits 24    Date for PT Re-Evaluation 03/23/22    Progress Note Due on Visit 10    PT Start Time 1200    PT Stop Time 1230    PT Time Calculation (min) 30 min    Equipment Utilized During Treatment Gait belt             Past Medical History:  Diagnosis Date   Anxiety    Coronary artery disease    CVA (cerebral vascular accident) (HCC) 01/2020   Diabetes mellitus without complication (HCC)    Hypercholesteremia    Hypertension    Left-sided weakness    S/P stroke   Past Surgical History:  Procedure Laterality Date   AORTIC VALVE REPLACEMENT (AVR)/CORONARY ARTERY BYPASS GRAFTING (CABG)  2009   CAROTID ANGIOGRAPHY N/A 03/21/2020   Procedure: CAROTID ANGIOGRAPHY;  Surgeon: Danielle Needy, MD;  Location: [facility];  Service: Cardiovascular;  Laterality: N/A;   Patient Active Problem List   Diagnosis Date Noted   Carotid stenosis 03/05/2020   CVA (cerebral vascular accident) (HCC) 02/03/2020   TIA (transient ischemic attack) 02/02/2020   AKI (acute kidney injury) (HCC) 02/02/2020   Acute lower UTI 02/02/2020   Diabetes mellitus without complication (HCC)    Hypertension    CKD (chronic kidney disease) stage 3, GFR 30-59 ml/min (HCC) 01/09/2016   Iron deficiency anemia 04/15/2014   Systolic heart failure, chronic (HCC) 03/28/2014   Obesity (BMI 30-39.9) 11/26/2012   Primary osteoarthritis of right hand 02/29/2012   Diabetic retinopathy associated with type 2 diabetes mellitus (HCC) 06/17/2011   Esophageal dysmotility 06/17/2011   CAD (coronary artery disease), native coronary artery 10/01/2010   Cataracts, bilateral 10/01/2010   Hyperlipidemia 10/01/2010   Depression  09/30/2010   Sarcoidosis of lung (HCC) 09/30/2010    ONSET DATE: 01/2020 CVA  REFERRING DIAG: Muscle weakness, Stroke  THERAPY DIAG:  Difficulty in walking, not elsewhere classified  Muscle weakness (generalized)  Rationale for Evaluation and Treatment: Rehabilitation  SUBJECTIVE:                                                                                                                                                                                             SUBJECTIVE STATEMENT: Pt reports she hasn't been able to do much and denies any  falls since the evaluation.  Pt accompanied by: self  PERTINENT HISTORY: Danielle Paul is a 66 y.o. with a complaint of imbalance and LE weakness s/p CVA in 2021.  Pt has PMH including: CAD, cataracts, depression, diabetic retinopathy due Type 2 diabetes mellitus, DM2, and HTN.  PAIN:  Are you having pain? No  PRECAUTIONS: None  WEIGHT BEARING RESTRICTIONS: No  FALLS: Has patient fallen in last 6 months? Yes. Number of falls 5-6  LIVING ENVIRONMENT: Lives with: lives with their spouse Lives in: House/apartment Stairs: Yes: Internal: 13 steps; pt has a chair to take her upstairs Has following equipment at home: Single point cane, Walker - 4 wheeled, Hemi walker, Wheelchair (manual), shower chair, bed side commode, Grab bars, and Ramped entry  PLOF: Needs assistance with ADLs, Needs assistance with homemaking, Needs assistance with gait, and Needs assistance with transfers  PATIENT GOALS: Return to PLOF and be able to walk again and be independent.  OBJECTIVE:   DIAGNOSTIC FINDINGS: N/A  BP: 157/67 mmHg; HR: 66  COGNITION: Overall cognitive status: Within functional limits for tasks assessed.   SENSATION: WFL  COORDINATION: WFL  POSTURE: rounded shoulders, forward head, increased lumbar lordosis, and weight shift left  LOWER EXTREMITY ROM:     Active  Right Eval Left Eval  Hip flexion    Hip extension    Hip  abduction    Hip adduction    Hip internal rotation    Hip external rotation    Knee flexion    Knee extension    Ankle dorsiflexion    Ankle plantarflexion    Ankle inversion    Ankle eversion     (Blank rows = not tested)   LOWER EXTREMITY MMT:    MMT Right Eval Left Eval  Hip flexion 4- 3-  Hip extension    Hip abduction 4- 3-  Hip adduction 4- 3-  Hip internal rotation    Hip external rotation    Knee flexion 4- 3-  Knee extension 4- 3-  Ankle dorsiflexion 5 4-  Ankle plantarflexion    Ankle inversion    Ankle eversion    (Blank rows = not tested)  L LE unable to perform again gravity   TRANSFERS: Assistive device utilized: Hemi walker  Sit to stand: Mod A Stand to sit: Min A   FUNCTIONAL TESTS:  5 times sit to stand: Unable to perform at this time.  PATIENT SURVEYS:  FOTO 26/37  TODAY'S TREATMENT:                                                                                                                                TherEx:  Seated marches, 2x10 with AAROM applied by the therapist Seated LAQ, 2x10 with AAROM applied by the therapist Heel raises, 2x15 each LE Seated isometric abduction/adduction into pressure applied by therapist, 3 sec holds, 2x15 each LE   TherAct:  Transfer training to the plinth  and the wheelchair with minA applied and verbal cuing for correct technique and blocking of the L LE to prevent buckling    PATIENT EDUCATION: Education details: Pt educated on role of PT and services provided during current POC, along with prognosis and information about the clinic.  Person educated: Patient Education method: Explanation Education comprehension: verbalized understanding  HOME EXERCISE PROGRAM: Give to pt at next visit.  GOALS: Goals reviewed with patient? Yes  SHORT TERM GOALS: Target date: 02/02/2022  Pt will be independent with HEP in order to demonstrate increased ability to perform tasks related to  occupation/hobbies. Baseline:  Pt to be given HEP at next session Goal status: INITIAL   LONG TERM GOALS: Target date: 03/30/2022  1.  Patient (> 70 years old) will complete one STS with modified independence indicating an increased LE strength and improved balance. Baseline: Unable to perform without modA from therapist Goal status: INITIAL  2.  Patient will increase FOTO score to equal to or greater than 37 to demonstrate statistically significant improvement in mobility and quality of life.  Baseline: FOTO: 26 Goal status: INITIAL   3.  Patient will increase MMT of B LE by 1/2 grade to demonstrate improved strength necessary for safe mobility/transfers. Baseline: Gross L LE: 3-/5 Goal status: INITIAL     ASSESSMENT:  CLINICAL IMPRESSION:  Treatment limited due to pt arriving to the clinic late.  Pt able to put forth good effort throughout the session even with time limited activities.  Pt also transfers well with good safety awareness.  Pt will continue to benefit from improving strength of the LE's specifically the L.  Pt seems to be ready to begin OT during visit next week as well.   Pt will continue to benefit from skilled therapy to address remaining deficits in order to improve overall QoL and return to PLOF.       OBJECTIVE IMPAIRMENTS: Abnormal gait, decreased activity tolerance, decreased balance, decreased endurance, decreased knowledge of use of DME, decreased mobility, difficulty walking, decreased strength, decreased safety awareness, increased fascial restrictions, and obesity.   ACTIVITY LIMITATIONS: carrying, lifting, bending, standing, squatting, stairs, transfers, bed mobility, continence, bathing, toileting, dressing, reach over head, hygiene/grooming, and caring for others  PARTICIPATION LIMITATIONS: meal prep, cleaning, laundry, driving, shopping, community activity, and yard work  PERSONAL FACTORS: Fitness, Past/current experiences, Time since onset of  injury/illness/exacerbation, Transportation, and 3+ comorbidities: Kidney disease, DM2, HTN  are also affecting patient's functional outcome.   REHAB POTENTIAL: Fair pt is 2 years s/p CVA  CLINICAL DECISION MAKING: Stable/uncomplicated  EVALUATION COMPLEXITY: Moderate  PLAN:  PT FREQUENCY: 2x/week  PT DURATION: 12 weeks  PLANNED INTERVENTIONS: Therapeutic exercises, Therapeutic activity, Neuromuscular re-education, Balance training, Gait training, Patient/Family education, Self Care, Joint mobilization, Dry Needling, Wheelchair mobility training, and Manual therapy  PLAN FOR NEXT SESSION: Give HEP,  continue with assessment for balance level.  Perform transfers.   Nolon Bussing, PT, DPT Physical Therapist- [facility]  01/05/22, 2:21 PM

## 2022-01-08 ENCOUNTER — Ambulatory Visit: Payer: Medicare Other

## 2022-01-08 DIAGNOSIS — M25562 Pain in left knee: Secondary | ICD-10-CM | POA: Diagnosis not present

## 2022-01-08 DIAGNOSIS — G8929 Other chronic pain: Secondary | ICD-10-CM | POA: Diagnosis not present

## 2022-01-08 DIAGNOSIS — R262 Difficulty in walking, not elsewhere classified: Secondary | ICD-10-CM | POA: Diagnosis not present

## 2022-01-08 DIAGNOSIS — R531 Weakness: Secondary | ICD-10-CM

## 2022-01-08 DIAGNOSIS — M6281 Muscle weakness (generalized): Secondary | ICD-10-CM | POA: Diagnosis not present

## 2022-01-08 NOTE — Therapy (Signed)
OUTPATIENT PHYSICAL THERAPY NEURO TREATMENT   Patient Name: Danielle Paul MRN: [id number] DOB:September 29, 1955, 66 y.o., female Today's Date: 01/08/2022   PCP: Danielle Loveless, MD REFERRING PROVIDER: Miki Kins, FNP   PT End of Session - 01/08/22 1352     Visit Number 3    Number of Visits 24    Date for PT Re-Evaluation 03/23/22    Progress Note Due on Visit 10    PT Start Time 1345    PT Stop Time 1430    PT Time Calculation (min) 45 min    Equipment Utilized During Treatment Gait belt              Past Medical History:  Diagnosis Date   Anxiety    Coronary artery disease    CVA (cerebral vascular accident) (HCC) 01/2020   Diabetes mellitus without complication (HCC)    Hypercholesteremia    Hypertension    Left-sided weakness    S/P stroke   Past Surgical History:  Procedure Laterality Date   AORTIC VALVE REPLACEMENT (AVR)/CORONARY ARTERY BYPASS GRAFTING (CABG)  2009   CAROTID ANGIOGRAPHY N/A 03/21/2020   Procedure: CAROTID ANGIOGRAPHY;  Surgeon: Danielle Needy, MD;  Location: [facility];  Service: Cardiovascular;  Laterality: N/A;   Patient Active Problem List   Diagnosis Date Noted   Carotid stenosis 03/05/2020   CVA (cerebral vascular accident) (HCC) 02/03/2020   TIA (transient ischemic attack) 02/02/2020   AKI (acute kidney injury) (HCC) 02/02/2020   Acute lower UTI 02/02/2020   Diabetes mellitus without complication (HCC)    Hypertension    CKD (chronic kidney disease) stage 3, GFR 30-59 ml/min (HCC) 01/09/2016   Iron deficiency anemia 04/15/2014   Systolic heart failure, chronic (HCC) 03/28/2014   Obesity (BMI 30-39.9) 11/26/2012   Primary osteoarthritis of right hand 02/29/2012   Diabetic retinopathy associated with type 2 diabetes mellitus (HCC) 06/17/2011   Esophageal dysmotility 06/17/2011   CAD (coronary artery disease), native coronary artery 10/01/2010   Cataracts, bilateral 10/01/2010   Hyperlipidemia 10/01/2010   Depression  09/30/2010   Sarcoidosis of lung (HCC) 09/30/2010    ONSET DATE: 01/2020 CVA  REFERRING DIAG: Muscle weakness, Stroke  THERAPY DIAG:  No diagnosis found.  Rationale for Evaluation and Treatment: Rehabilitation  SUBJECTIVE:                                                                                                                                                                                             SUBJECTIVE STATEMENT:  Pt denies any falls since last visit but is curious as to when her OT evaluation is.  Printed scheduled for pt to prepare for evaluation next Monday.   Pt accompanied by: self  PERTINENT HISTORY: Danielle Paul is a 66 y.o. with a complaint of imbalance and LE weakness s/p CVA in 2021.  Pt has PMH including: CAD, cataracts, depression, diabetic retinopathy due Type 2 diabetes mellitus, DM2, and HTN.  PAIN:  Are you having pain? No  PRECAUTIONS: None  WEIGHT BEARING RESTRICTIONS: No  FALLS: Has patient fallen in last 6 months? Yes. Number of falls 5-6  LIVING ENVIRONMENT: Lives with: lives with their spouse Lives in: House/apartment Stairs: Yes: Internal: 13 steps; pt has a chair to take her upstairs Has following equipment at home: Single point cane, Walker - 4 wheeled, Hemi walker, Wheelchair (manual), shower chair, bed side commode, Grab bars, and Ramped entry  PLOF: Needs assistance with ADLs, Needs assistance with homemaking, Needs assistance with gait, and Needs assistance with transfers  PATIENT GOALS: Return to PLOF and be able to walk again and be independent.  OBJECTIVE:   DIAGNOSTIC FINDINGS: N/A  BP: 157/67 mmHg; HR: 66  COGNITION: Overall cognitive status: Within functional limits for tasks assessed.   SENSATION: WFL  COORDINATION: WFL  POSTURE: rounded shoulders, forward head, increased lumbar lordosis, and weight shift left  LOWER EXTREMITY ROM:     Active  Right Eval Left Eval  Hip flexion    Hip extension     Hip abduction    Hip adduction    Hip internal rotation    Hip external rotation    Knee flexion    Knee extension    Ankle dorsiflexion    Ankle plantarflexion    Ankle inversion    Ankle eversion     (Blank rows = not tested)   LOWER EXTREMITY MMT:    MMT Right Eval Left Eval  Hip flexion 4- 3-  Hip extension    Hip abduction 4- 3-  Hip adduction 4- 3-  Hip internal rotation    Hip external rotation    Knee flexion 4- 3-  Knee extension 4- 3-  Ankle dorsiflexion 5 4-  Ankle plantarflexion    Ankle inversion    Ankle eversion    (Blank rows = not tested)  L LE unable to perform again gravity   TRANSFERS: Assistive device utilized: Hemi walker  Sit to stand: Mod A Stand to sit: Min A   FUNCTIONAL TESTS:  5 times sit to stand: Unable to perform at this time.  PATIENT SURVEYS:  FOTO 26/37   TODAY'S TREATMENT:                                                                                                                               Neuro:  In // bars:  Standing weight shifts to each side to promote proprioception of the L LE and weight tolerance of the L LE, 3x15 each direction Standing weight shifts with R LE on Theradisc to improve weight tolerance  of the L LE, 3x15 each direction Ambulation with heavy use of the R UE on the bars, 2x length of the // bars, pt with decreased safety awareness    TherEx:  Seated marches, 2x10 with AAROM applied by the therapist Seated LAQ, 2x10 with AAROM applied by the therapist Heel raises, 2x15 each LE Seated isometric abduction/adduction into physioball/pressure applied by therapist, 3 sec holds, 2x15 each LE    PATIENT EDUCATION: Education details: Pt educated on role of PT and services provided during current POC, along with prognosis and information about the clinic.  Person educated: Patient Education method: Explanation Education comprehension: verbalized understanding  HOME EXERCISE PROGRAM: Give  to pt at next visit.  GOALS: Goals reviewed with patient? Yes  SHORT TERM GOALS: Target date: 02/05/2022  Pt will be independent with HEP in order to demonstrate increased ability to perform tasks related to occupation/hobbies. Baseline:  Pt to be given HEP at next session Goal status: INITIAL   LONG TERM GOALS: Target date: 04/02/2022  1.  Patient (> 52 years old) will complete one STS with modified independence indicating an increased LE strength and improved balance. Baseline: Unable to perform without modA from therapist Goal status: INITIAL  2.  Patient will increase FOTO score to equal to or greater than 37 to demonstrate statistically significant improvement in mobility and quality of life.  Baseline: FOTO: 26 Goal status: INITIAL   3.  Patient will increase MMT of B LE by 1/2 grade to demonstrate improved strength necessary for safe mobility/transfers. Baseline: Gross L LE: 3-/5 Goal status: INITIAL     ASSESSMENT:  CLINICAL IMPRESSION: Pt performed well with the exercises given and put forth great effort throughout the session.  Pt did have some fatigue with the standing exercises and the ambulation within the // bars.  Once pt attempted the laps in the // bars, she was extremely fatigued and needed assistance to the chair.  Pt to continue working on ambulation and muscle activation of the L LE.  Orders for AFO and wheelchair have been sent to MD for signing off.   Pt will continue to benefit from skilled therapy to address remaining deficits in order to improve overall QoL and return to PLOF.       OBJECTIVE IMPAIRMENTS: Abnormal gait, decreased activity tolerance, decreased balance, decreased endurance, decreased knowledge of use of DME, decreased mobility, difficulty walking, decreased strength, decreased safety awareness, increased fascial restrictions, and obesity.   ACTIVITY LIMITATIONS: carrying, lifting, bending, standing, squatting, stairs, transfers, bed  mobility, continence, bathing, toileting, dressing, reach over head, hygiene/grooming, and caring for others  PARTICIPATION LIMITATIONS: meal prep, cleaning, laundry, driving, shopping, community activity, and yard work  PERSONAL FACTORS: Fitness, Past/current experiences, Time since onset of injury/illness/exacerbation, Transportation, and 3+ comorbidities: Kidney disease, DM2, HTN  are also affecting patient's functional outcome.   REHAB POTENTIAL: Fair pt is 2 years s/p CVA  CLINICAL DECISION MAKING: Stable/uncomplicated  EVALUATION COMPLEXITY: Moderate  PLAN:  PT FREQUENCY: 2x/week  PT DURATION: 12 weeks  PLANNED INTERVENTIONS: Therapeutic exercises, Therapeutic activity, Neuromuscular re-education, Balance training, Gait training, Patient/Family education, Self Care, Joint mobilization, Dry Needling, Wheelchair mobility training, and Manual therapy  PLAN FOR NEXT SESSION:   Give HEP,  continue with assessment for balance level.  Perform transfers.   Nolon Bussing, PT, DPT Physical Therapist- [facility]  01/08/22, 1:53 PM

## 2022-01-09 ENCOUNTER — Ambulatory Visit: Payer: Medicare Other | Admitting: Physical Therapy

## 2022-01-09 ENCOUNTER — Telehealth: Payer: Self-pay | Admitting: Physical Therapy

## 2022-01-09 NOTE — Telephone Encounter (Signed)
I called [facility] because Mrs Galli said they cant pick her up any days but Tuesday and Friday. Marcelino Duster Emergency planning/management officer at [employer]) said the only day they were full was November 27. I was calling to tell her she is schedled for a ride 20th but couldnt LVM

## 2022-01-12 ENCOUNTER — Ambulatory Visit: Payer: Medicare Other | Admitting: Occupational Therapy

## 2022-01-12 ENCOUNTER — Ambulatory Visit: Payer: Medicare Other

## 2022-01-19 ENCOUNTER — Ambulatory Visit: Payer: Medicare Other

## 2022-01-23 ENCOUNTER — Ambulatory Visit: Payer: Medicare Other

## 2022-01-23 ENCOUNTER — Ambulatory Visit: Payer: Medicare Other | Admitting: Physical Therapy

## 2022-01-26 ENCOUNTER — Ambulatory Visit: Payer: Medicare Other

## 2022-01-26 ENCOUNTER — Encounter: Payer: Medicare Other | Admitting: Occupational Therapy

## 2022-01-30 ENCOUNTER — Ambulatory Visit: Payer: Medicare Other

## 2022-01-30 ENCOUNTER — Encounter: Payer: Self-pay | Admitting: Physical Therapy

## 2022-01-30 ENCOUNTER — Ambulatory Visit: Payer: Medicare Other | Attending: Family | Admitting: Physical Therapy

## 2022-01-30 DIAGNOSIS — G8929 Other chronic pain: Secondary | ICD-10-CM | POA: Diagnosis not present

## 2022-01-30 DIAGNOSIS — R531 Weakness: Secondary | ICD-10-CM | POA: Insufficient documentation

## 2022-01-30 DIAGNOSIS — M6281 Muscle weakness (generalized): Secondary | ICD-10-CM | POA: Diagnosis not present

## 2022-01-30 DIAGNOSIS — R262 Difficulty in walking, not elsewhere classified: Secondary | ICD-10-CM | POA: Insufficient documentation

## 2022-01-30 DIAGNOSIS — R278 Other lack of coordination: Secondary | ICD-10-CM | POA: Insufficient documentation

## 2022-01-30 DIAGNOSIS — M25562 Pain in left knee: Secondary | ICD-10-CM | POA: Insufficient documentation

## 2022-01-30 NOTE — Therapy (Signed)
OUTPATIENT PHYSICAL THERAPY NEURO TREATMENT   Patient Name: Danielle Paul MRN: [id number] DOB:[birth year], 66 y.o., female Today's Date: 01/30/2022   PCP: Margaretann Loveless, MD REFERRING PROVIDER: Miki Kins, FNP   Danielle Paul End of Session - 01/30/22 1022     Visit Number 4    Number of Visits 24    Date for Danielle Paul Re-Evaluation 03/23/22    Progress Note Due on Visit 10    Danielle Paul Start Time 1018    Danielle Paul Stop Time 1058    Danielle Paul Time Calculation (min) 40 min    Equipment Utilized During Treatment Gait belt               Past Medical History:  Diagnosis Date   Anxiety    Coronary artery disease    CVA (cerebral vascular accident) (HCC) 01/2020   Diabetes mellitus without complication (HCC)    Hypercholesteremia    Hypertension    Left-sided weakness    S/P stroke   Past Surgical History:  Procedure Laterality Date   AORTIC VALVE REPLACEMENT (AVR)/CORONARY ARTERY BYPASS GRAFTING (CABG)  2009   CAROTID ANGIOGRAPHY N/A 03/21/2020   Procedure: CAROTID ANGIOGRAPHY;  Surgeon: Annice Needy, MD;  Location: [facility];  Service: Cardiovascular;  Laterality: N/A;   Patient Active Problem List   Diagnosis Date Noted   Carotid stenosis 03/05/2020   CVA (cerebral vascular accident) (HCC) 02/03/2020   TIA (transient ischemic attack) 02/02/2020   AKI (acute kidney injury) (HCC) 02/02/2020   Acute lower UTI 02/02/2020   Diabetes mellitus without complication (HCC)    Hypertension    CKD (chronic kidney disease) stage 3, GFR 30-59 ml/min (HCC) 01/09/2016   Iron deficiency anemia 04/15/2014   Systolic heart failure, chronic (HCC) 03/28/2014   Obesity (BMI 30-39.9) 11/26/2012   Primary osteoarthritis of right hand 02/29/2012   Diabetic retinopathy associated with type 2 diabetes mellitus (HCC) 06/17/2011   Esophageal dysmotility 06/17/2011   CAD (coronary artery disease), native coronary artery 10/01/2010   Cataracts, bilateral 10/01/2010   Hyperlipidemia 10/01/2010   Depression  09/30/2010   Sarcoidosis of lung (HCC) 09/30/2010    ONSET DATE: 01/2020 CVA  REFERRING DIAG: Muscle weakness, Stroke  THERAPY DIAG:  Difficulty in walking, not elsewhere classified  Muscle weakness (generalized)  Left-sided weakness  Rationale for Evaluation and Treatment: Rehabilitation  SUBJECTIVE:                                                                                                                                                                                             SUBJECTIVE STATEMENT:  Danielle Paul denies any falls since last  visit but is curious as to when her OT evaluation is.  Printed scheduled for Danielle Paul to prepare for evaluation next Monday.   Danielle Paul accompanied by: self  PERTINENT HISTORY: Danielle Paul is a 66 y.o. with a complaint of imbalance and LE weakness s/p CVA in 2021.  Danielle Paul has PMH including: CAD, cataracts, depression, diabetic retinopathy due Type 2 diabetes mellitus, DM2, and HTN.  PAIN:  Are you having pain? No  PRECAUTIONS: None  WEIGHT BEARING RESTRICTIONS: No  FALLS: Has patient fallen in last 6 months? Yes. Number of falls 5-6  LIVING ENVIRONMENT: Lives with: lives with their spouse Lives in: House/apartment Stairs: Yes: Internal: 13 steps; Danielle Paul has a chair to take her upstairs Has following equipment at home: Single point cane, Walker - 4 wheeled, Hemi walker, Wheelchair (manual), shower chair, bed side commode, Grab bars, and Ramped entry  PLOF: Needs assistance with ADLs, Needs assistance with homemaking, Needs assistance with gait, and Needs assistance with transfers  PATIENT GOALS: Return to PLOF and be able to walk again and be independent.  OBJECTIVE:   DIAGNOSTIC FINDINGS: N/A  BP: 157/67 mmHg; HR: 66  COGNITION: Overall cognitive status: Within functional limits for tasks assessed.   SENSATION: WFL  COORDINATION: WFL  POSTURE: rounded shoulders, forward head, increased lumbar lordosis, and weight shift left  LOWER  EXTREMITY ROM:     Active  Right Eval Left Eval  Hip flexion    Hip extension    Hip abduction    Hip adduction    Hip internal rotation    Hip external rotation    Knee flexion    Knee extension    Ankle dorsiflexion    Ankle plantarflexion    Ankle inversion    Ankle eversion     (Blank rows = not tested)   LOWER EXTREMITY MMT:    MMT Right Eval Left Eval  Hip flexion 4- 3-  Hip extension    Hip abduction 4- 3-  Hip adduction 4- 3-  Hip internal rotation    Hip external rotation    Knee flexion 4- 3-  Knee extension 4- 3-  Ankle dorsiflexion 5 4-  Ankle plantarflexion    Ankle inversion    Ankle eversion    (Blank rows = not tested)  L LE unable to perform again gravity   TRANSFERS: Assistive device utilized: Hemi walker  Sit to stand: Mod A Stand to sit: Min A   FUNCTIONAL TESTS:  5 times sit to stand: Unable to perform at this time.  PATIENT SURVEYS:  FOTO 26/37   TODAY'S TREATMENT:                                                                                                                               Neuro:  In // bars:  Standing weight shifts to each side to promote proprioception of the L LE and weight tolerance of the L LE, 2x15 each direction  Standing Heel raise on R LE to promote increased WB on L LE. X 10 reps    TherEx:  Seated marches, 2x10 with AAROM applied by the therapist on the L, 3# AW applied to the right to increase resistance and muscular challenge  Seated LAQ, 2x10 with AAROM applied by the therapist Heel raises, 2x15 each LE Seated isometric abduction/adduction into physioball/pressure applied by therapist, 3 sec holds, 2x15 each LE Seated isometric L gluteal muscle activation with feedback from sphygmomanometer 2 x 10 reps.  -cuff inflated and put under heel and Danielle Paul provided with visual feedback of force through heel.      PATIENT EDUCATION: Education details: Danielle Paul educated on role of Danielle Paul and services provided  during current POC, along with prognosis and information about the clinic.  Person educated: Patient Education method: Explanation Education comprehension: verbalized understanding  HOME EXERCISE PROGRAM: Give to Danielle Paul at next visit.  GOALS: Goals reviewed with patient? Yes  SHORT TERM GOALS: Target date: 02/05/2022  Danielle Paul will be independent with HEP in order to demonstrate increased ability to perform tasks related to occupation/hobbies. Baseline:  Danielle Paul to be given HEP at next session Goal status: INITIAL   LONG TERM GOALS: Target date: 04/02/2022  1.  Patient (> 17 years old) will complete one STS with modified independence indicating an increased LE strength and improved balance. Baseline: Unable to perform without modA from therapist Goal status: INITIAL  2.  Patient will increase FOTO score to equal to or greater than 37 to demonstrate statistically significant improvement in mobility and quality of life.  Baseline: FOTO: 26 Goal status: INITIAL   3.  Patient will increase MMT of B LE by 1/2 grade to demonstrate improved strength necessary for safe mobility/transfers. Baseline: Gross L LE: 3-/5 Goal status: INITIAL     ASSESSMENT:  CLINICAL IMPRESSION:  Danielle Paul presents with great motivation to complete Danielle Paul activities. Continued with current plan of care as laid out in evaluation and recent prior sessions. Danielle Paul remains motivated to advance progress toward goals in order to maximize independence and safety at home. Danielle Paul requires high level assistance and cuing for completion of exercises in order to provide adequate level of stimulation challenge  to ensure proper muscular activation. Danielle Paul will continue to benefit from skilled physical therapy intervention to address impairments, improve QOL, and attain therapy goals.        OBJECTIVE IMPAIRMENTS: Abnormal gait, decreased activity tolerance, decreased balance, decreased endurance, decreased knowledge of use of DME, decreased mobility,  difficulty walking, decreased strength, decreased safety awareness, increased fascial restrictions, and obesity.   ACTIVITY LIMITATIONS: carrying, lifting, bending, standing, squatting, stairs, transfers, bed mobility, continence, bathing, toileting, dressing, reach over head, hygiene/grooming, and caring for others  PARTICIPATION LIMITATIONS: meal prep, cleaning, laundry, driving, shopping, community activity, and yard work  PERSONAL FACTORS: Fitness, Past/current experiences, Time since onset of injury/illness/exacerbation, Transportation, and 3+ comorbidities: Kidney disease, DM2, HTN  are also affecting patient's functional outcome.   REHAB POTENTIAL: Fair Danielle Paul is 2 years s/p CVA  CLINICAL DECISION MAKING: Stable/uncomplicated  EVALUATION COMPLEXITY: Moderate  PLAN:  Danielle Paul FREQUENCY: 2x/week  Danielle Paul DURATION: 12 weeks  PLANNED INTERVENTIONS: Therapeutic exercises, Therapeutic activity, Neuromuscular re-education, Balance training, Gait training, Patient/Family education, Self Care, Joint mobilization, Dry Needling, Wheelchair mobility training, and Manual therapy  PLAN FOR NEXT SESSION:   Give HEP,  continue with assessment for balance level.  Perform transfers.   Norman Herrlich Danielle Paul  01/30/22, 11:11 AM

## 2022-01-30 NOTE — Therapy (Unsigned)
OUTPATIENT OCCUPATIONAL THERAPY NEURO EVALUATION  Patient Name: Danielle Paul MRN: [id number] DOB:[birth year], 66 y.o., female Today's Date: 01/30/2022  PCP:  REFERRING PROVIDER: ***  END OF SESSION:   Past Medical History:  Diagnosis Date   Anxiety    Coronary artery disease    CVA (cerebral vascular accident) (HCC) 01/2020   Diabetes mellitus without complication (HCC)    Hypercholesteremia    Hypertension    Left-sided weakness    S/P stroke   Past Surgical History:  Procedure Laterality Date   AORTIC VALVE REPLACEMENT (AVR)/CORONARY ARTERY BYPASS GRAFTING (CABG)  2009   CAROTID ANGIOGRAPHY N/A 03/21/2020   Procedure: CAROTID ANGIOGRAPHY;  Surgeon: Annice Needy, MD;  Location: [facility];  Service: Cardiovascular;  Laterality: N/A;   Patient Active Problem List   Diagnosis Date Noted   Carotid stenosis 03/05/2020   CVA (cerebral vascular accident) (HCC) 02/03/2020   TIA (transient ischemic attack) 02/02/2020   AKI (acute kidney injury) (HCC) 02/02/2020   Acute lower UTI 02/02/2020   Diabetes mellitus without complication (HCC)    Hypertension    CKD (chronic kidney disease) stage 3, GFR 30-59 ml/min (HCC) 01/09/2016   Iron deficiency anemia 04/15/2014   Systolic heart failure, chronic (HCC) 03/28/2014   Obesity (BMI 30-39.9) 11/26/2012   Primary osteoarthritis of right hand 02/29/2012   Diabetic retinopathy associated with type 2 diabetes mellitus (HCC) 06/17/2011   Esophageal dysmotility 06/17/2011   CAD (coronary artery disease), native coronary artery 10/01/2010   Cataracts, bilateral 10/01/2010   Hyperlipidemia 10/01/2010   Depression 09/30/2010   Sarcoidosis of lung (HCC) 09/30/2010    ONSET DATE: ***  REFERRING DIAG: ***  THERAPY DIAG:  No diagnosis found.  Rationale for Evaluation and Treatment: Rehabilitation  SUBJECTIVE:   SUBJECTIVE STATEMENT: *** Pt accompanied by: {accompnied:27141}  PERTINENT HISTORY: ***  PRECAUTIONS:  {Therapy precautions:24002}  WEIGHT BEARING RESTRICTIONS: {Yes ***/No:24003}  PAIN:  Are you having pain? Yes: NPRS scale: 3/10 Pain location: bilat knees Pain description: dull ache Aggravating factors: sitting in wc too long  Relieving factors: repositioning, rubbing, voltaren  8/10 pain in L shoulder when spouse helps with bathing arm   FALLS: Has patient fallen in last 6 months? Yes. Number of falls pt estimates 1 or 2   LIVING ENVIRONMENT: Lives with: lives with their spouse Lives in: 2 level home  Stairs: Yes: Internal: 1 flight; ramp to enter house for front and back door steps; pt has stair lift Has following equipment at home: Wheelchair (manual), hemi-walker, shower chair with back/no arm rests, walk in shower with grab bars, hand held shower hose but not yet installed, grab bars both sides of toilet, standard height toilet  PLOF: Independent  PATIENT GOALS: improve indep with self care and to walk  OBJECTIVE:   HAND DOMINANCE: Right  ADLs: Overall ADLs: *** Transfers/ambulation related to ADLs: Eating: 1 handed  Grooming: set up UB Dressing: max A LB Dressing: max A; unable to don L shoe Toileting: mod A (clothing management) Bathing: 1x per week  Tub Shower transfers: min A Equipment: {equipment:25573}  IADLs: Shopping: cousin assists  Light housekeeping: pt does what she can from wc level Meal Prep: Meals on Wheels; pt can manage micorwavable and frozen meals in the oven.  Pt can do a little cooking from wc level. Spouse adjusts stove dials. Community mobility: *** Medication management: pt gets pill packs, spouse makes sure pt takes them Financial management: pt manages  Handwriting: {OTWRITTENEXPRESSION:25361}  MOBILITY STATUS: {OTMOBILITY:25360}  POSTURE  COMMENTS:  rounded shoulders, L sided hemiparesis Sitting balance: {sitting balance:25483}  ACTIVITY TOLERANCE: Activity tolerance: ***  FUNCTIONAL OUTCOME MEASURES: FOTO: ***  UPPER  EXTREMITY ROM:    {AROM/PROM:27142} ROM Right eval Left eval  Shoulder flexion    Shoulder abduction    Shoulder adduction    Shoulder extension    Shoulder internal rotation    Shoulder external rotation    Elbow flexion    Elbow extension    Wrist flexion    Wrist extension    Wrist ulnar deviation    Wrist radial deviation    Wrist pronation    Wrist supination    (Blank rows = not tested)  UPPER EXTREMITY MMT:   RUE 4+/5, L 1/5 through elbow  MMT Right eval Left eval  Shoulder flexion    Shoulder abduction    Shoulder adduction    Shoulder extension    Shoulder internal rotation    Shoulder external rotation    Middle trapezius    Lower trapezius    Elbow flexion    Elbow extension    Wrist flexion    Wrist extension    Wrist ulnar deviation    Wrist radial deviation    Wrist pronation    Wrist supination    (Blank rows = not tested)  HAND FUNCTION: Grip strength: Right: 63 lbs; Left: *** lbs, Lateral pinch: Right: 14 lbs, Left: *** lbs, and 3 point pinch: Right: 15 lbs, Left: *** lbs  COORDINATION: 9 Hole Peg test: Right: *** sec; Left: *** sec  SENSATION: {sensation:27233}  EDEMA: ***  MUSCLE TONE: {UETONE:25567}  COGNITION: Overall cognitive status: {cognition:24006}  VISION: Subjective report: *** Baseline vision: {OTBASELINEVISION:25363} Visual history: {OTVISUALHISTORY:25364}  VISION ASSESSMENT: {visionassessment:27231}  Patient has difficulty with following activities due to following visual impairments: ***  PERCEPTION: {Perception:25564}  PRAXIS: {Praxis:25565}  OBSERVATIONS: ***   TODAY'S TREATMENT:                                                                                                                              DATE: ***   PATIENT EDUCATION: Education details: *** Person educated: {Person educated:25204} Education method: {Education Method:25205} Education comprehension: {Education  Comprehension:25206}  HOME EXERCISE PROGRAM: ***   GOALS: Goals reviewed with patient? {yes/no:20286}  SHORT TERM GOALS: Target date: ***  *** Baseline: Goal status: {GOALSTATUS:25110}  2.  *** Baseline:  Goal status: {GOALSTATUS:25110}  3.  *** Baseline:  Goal status: {GOALSTATUS:25110}  4.  *** Baseline:  Goal status: {GOALSTATUS:25110}  5.  *** Baseline:  Goal status: {GOALSTATUS:25110}  6.  *** Baseline:  Goal status: {GOALSTATUS:25110}  LONG TERM GOALS: Target date: ***  *** Baseline:  Goal status: {GOALSTATUS:25110}  2.  *** Baseline:  Goal status: {GOALSTATUS:25110}  3.  *** Baseline:  Goal status: {GOALSTATUS:25110}  4.  *** Baseline:  Goal status: {GOALSTATUS:25110}  5.  *** Baseline:  Goal status: {GOALSTATUS:25110}  6.  *** Baseline:  Goal status: {GOALSTATUS:25110}  ASSESSMENT:  CLINICAL IMPRESSION: Patient is a *** y.o. *** who was seen today for occupational therapy evaluation for ***.   PERFORMANCE DEFICITS: in functional skills including {OT physical skills:25468}, cognitive skills including {OT cognitive skills:25469}, and psychosocial skills including {OT psychosocial skills:25470}.   IMPAIRMENTS: are limiting patient from {OT performance deficits:25471}.   CO-MORBIDITIES: {Comorbidities:25485} that affects occupational performance. Patient will benefit from skilled OT to address above impairments and improve overall function.  MODIFICATION OR ASSISTANCE TO COMPLETE EVALUATION: {OT modification:25474}  OT OCCUPATIONAL PROFILE AND HISTORY: {OT PROFILE AND HISTORY:25484}  CLINICAL DECISION MAKING: {OT CDM:25475}  REHAB POTENTIAL: {rehabpotential:25112}  EVALUATION COMPLEXITY: {Evaluation complexity:25115}    PLAN:  OT FREQUENCY: {rehab frequency:25116}  OT DURATION: {rehab duration:25117}  PLANNED INTERVENTIONS: {OT Interventions:25467}  RECOMMENDED OTHER SERVICES: ***  CONSULTED AND AGREED WITH PLAN OF  CARE: {TDD:22025}  PLAN FOR NEXT SESSION: ***   Otis Dials, OT 01/30/2022, 9:40 AM

## 2022-02-02 ENCOUNTER — Ambulatory Visit: Payer: Medicare Other

## 2022-02-02 ENCOUNTER — Encounter: Payer: Medicare Other | Admitting: Occupational Therapy

## 2022-02-06 ENCOUNTER — Ambulatory Visit: Payer: Medicare Other | Admitting: Physical Therapy

## 2022-02-06 ENCOUNTER — Ambulatory Visit: Payer: Medicare Other

## 2022-02-09 ENCOUNTER — Ambulatory Visit: Payer: Medicare Other

## 2022-02-09 ENCOUNTER — Encounter: Payer: Medicare Other | Admitting: Occupational Therapy

## 2022-02-13 ENCOUNTER — Ambulatory Visit: Payer: Medicare Other | Admitting: Physical Therapy

## 2022-02-13 ENCOUNTER — Ambulatory Visit: Payer: Medicare Other

## 2022-02-13 DIAGNOSIS — M6281 Muscle weakness (generalized): Secondary | ICD-10-CM

## 2022-02-13 DIAGNOSIS — R531 Weakness: Secondary | ICD-10-CM

## 2022-02-13 DIAGNOSIS — G8929 Other chronic pain: Secondary | ICD-10-CM | POA: Diagnosis not present

## 2022-02-13 DIAGNOSIS — R262 Difficulty in walking, not elsewhere classified: Secondary | ICD-10-CM

## 2022-02-13 DIAGNOSIS — R278 Other lack of coordination: Secondary | ICD-10-CM | POA: Diagnosis not present

## 2022-02-13 DIAGNOSIS — M25562 Pain in left knee: Secondary | ICD-10-CM | POA: Diagnosis not present

## 2022-02-13 NOTE — Therapy (Signed)
OUTPATIENT PHYSICAL THERAPY NEURO TREATMENT   Patient Name: Danielle Paul MRN: [id number] DOB:07/31/1955, 66 y.o., female Today's Date: 02/13/2022   PCP: Perrin Maltese, MD REFERRING PROVIDER: Mechele Claude, FNP   PT End of Session - 02/13/22 0945     Visit Number 5    Number of Visits 24    Date for PT Re-Evaluation 03/23/22    Progress Note Due on Visit 10    PT Start Time 1016    PT Stop Time [id number]    PT Time Calculation (min) 42 min    Equipment Utilized During Treatment Gait belt               Past Medical History:  Diagnosis Date   Anxiety    Coronary artery disease    CVA (cerebral vascular accident) ([place]) 01/2020   Diabetes mellitus without complication ([place])    Hypercholesteremia    Hypertension    Left-sided weakness    S/P stroke   Past Surgical History:  Procedure Laterality Date   AORTIC VALVE REPLACEMENT (AVR)/CORONARY ARTERY BYPASS GRAFTING (CABG)  2009   CAROTID ANGIOGRAPHY N/A 03/21/2020   Procedure: CAROTID ANGIOGRAPHY;  Surgeon: Algernon Huxley, MD;  Location: [facility];  Service: Cardiovascular;  Laterality: N/A;   Patient Active Problem List   Diagnosis Date Noted   Carotid stenosis 03/05/2020   CVA (cerebral vascular accident) ([place]) 02/03/2020   TIA (transient ischemic attack) 02/02/2020   AKI (acute kidney injury) ([place]) 02/02/2020   Acute lower UTI 02/02/2020   Diabetes mellitus without complication ([place])    Hypertension    CKD (chronic kidney disease) stage 3, GFR 30-59 ml/min (HCC) 01/09/2016   Iron deficiency anemia [id number]   Systolic heart failure, chronic (HCC) 03/28/2014   Obesity (BMI 30-39.9) 11/26/2012   Primary osteoarthritis of right hand 02/29/2012   Diabetic retinopathy associated with type 2 diabetes mellitus ([place]) 06/17/2011   Esophageal dysmotility 06/17/2011   CAD (coronary artery disease), native coronary artery 10/01/2010   Cataracts, bilateral 10/01/2010   Hyperlipidemia 10/01/2010   Depression  09/30/2010   Sarcoidosis of lung ([place]) 09/30/2010    ONSET DATE: 01/2020 CVA  REFERRING DIAG: Muscle weakness, Stroke  THERAPY DIAG:  Left-sided weakness  Difficulty in walking, not elsewhere classified  Muscle weakness (generalized)  Chronic pain of left knee  Rationale for Evaluation and Treatment: Rehabilitation  SUBJECTIVE:                                                                                                                                                                                             SUBJECTIVE STATEMENT:  Pt denies any falls since last visit but is curious as to when her OT evaluation is.  Printed scheduled for pt to prepare for evaluation next Monday.   Pt accompanied by: self  PERTINENT HISTORY: Danielle Paul is a 66 y.o. with a complaint of imbalance and LE weakness s/p CVA in 2021.  Pt has PMH including: CAD, cataracts, depression, diabetic retinopathy due Type 2 diabetes mellitus, DM2, and HTN.  PAIN:  Are you having pain? No  PRECAUTIONS: None  WEIGHT BEARING RESTRICTIONS: No  FALLS: Has patient fallen in last 6 months? Yes. Number of falls 5-6  LIVING ENVIRONMENT: Lives with: lives with their spouse Lives in: House/apartment Stairs: Yes: Internal: 13 steps; pt has a chair to take her upstairs Has following equipment at home: Single point cane, Walker - 4 wheeled, Hemi walker, Wheelchair (manual), shower chair, bed side commode, Grab bars, and Ramped entry  PLOF: Needs assistance with ADLs, Needs assistance with homemaking, Needs assistance with gait, and Needs assistance with transfers  PATIENT GOALS: Return to PLOF and be able to walk again and be independent.  OBJECTIVE:   DIAGNOSTIC FINDINGS: N/A  BP: 157/67 mmHg; HR: 66  COGNITION: Overall cognitive status: Within functional limits for tasks assessed.   SENSATION: WFL  COORDINATION: WFL  POSTURE: rounded shoulders, forward head, increased lumbar lordosis, and  weight shift left  LOWER EXTREMITY ROM:     Active  Right Eval Left Eval  Hip flexion    Hip extension    Hip abduction    Hip adduction    Hip internal rotation    Hip external rotation    Knee flexion    Knee extension    Ankle dorsiflexion    Ankle plantarflexion    Ankle inversion    Ankle eversion     (Blank rows = not tested)   LOWER EXTREMITY MMT:    MMT Right Eval Left Eval  Hip flexion 4- 3-  Hip extension    Hip abduction 4- 3-  Hip adduction 4- 3-  Hip internal rotation    Hip external rotation    Knee flexion 4- 3-  Knee extension 4- 3-  Ankle dorsiflexion 5 4-  Ankle plantarflexion    Ankle inversion    Ankle eversion    (Blank rows = not tested)  L LE unable to perform again gravity   TRANSFERS: Assistive device utilized: Hemi walker  Sit to stand: Mod A Stand to sit: Min A   FUNCTIONAL TESTS:  5 times sit to stand: Unable to perform at this time.  PATIENT SURVEYS:  FOTO 26/37   TODAY'S TREATMENT:                                                                                                                               Neuro:  In // bars:  Patient assisted to standing with min assist or to come to standing position as well as with  blocking of left knee and lower extremity.  Standing weight shifts to each side to promote proprioception of the L LE and weight tolerance of the L LE,  Standing heel raise on right lower extremity to improve her left lower extremity weight shifting for progressing to taking steps.  Completed 2 x 10 reps with seated rest break in between.  Patient encouraged to perform heel raise and then attempt to take step with right lower extremity.  Physical therapist provides support to left lower extremity to prevent knee buckling and patient is able to take 2 steps with each lower extremity prior to needing seated rest break   TherEx:  Seated marches, 2x10 with AAROM applied by the therapist on the L, 3# AW  applied to the right to increase resistance and muscular challenge  Seated LAQ, 2x15 with limited Rom on the left and with Max A from PT for assisted hip flexion to allow foot clearance.  Heel raises, 2x15 each LE Seated isometric abduction/adduction into physioball/pressure applied by therapist, 3 sec holds, 2x15 each LE Seated isometric L gluteal muscle activation with feedback from sphygmomanometer 2 x 10 reps.  -cuff inflated and put under heel and pt provided with visual feedback of force through heel.      PATIENT EDUCATION: Education details: Pt educated on role of PT and services provided during current POC, along with prognosis and information about the clinic.  Person educated: Patient Education method: Explanation Education comprehension: verbalized understanding  HOME EXERCISE PROGRAM: Give to pt at next visit.  GOALS: Goals reviewed with patient? Yes  SHORT TERM GOALS: Target date: 02/05/2022  Pt will be independent with HEP in order to demonstrate increased ability to perform tasks related to occupation/hobbies. Baseline:  Pt to be given HEP at next session Goal status: INITIAL   LONG TERM GOALS: Target date: 04/02/2022  1.  Patient (> 38 years old) will complete one STS with modified independence indicating an increased LE strength and improved balance. Baseline: Unable to perform without modA from therapist Goal status: INITIAL  2.  Patient will increase FOTO score to equal to or greater than 37 to demonstrate statistically significant improvement in mobility and quality of life.  Baseline: FOTO: 26 Goal status: INITIAL   3.  Patient will increase MMT of B LE by 1/2 grade to demonstrate improved strength necessary for safe mobility/transfers. Baseline: Gross L LE: 3-/5 Goal status: INITIAL     ASSESSMENT:  CLINICAL IMPRESSION:  Pt presents with great motivation to complete PT activities. Continued with current plan of care as laid out in evaluation and  recent prior sessions. Pt remains motivated to advance progress toward goals in order to maximize independence and safety at home. Pt requires high level assistance and cuing for completion of exercises in order to provide adequate level of stimulation challenge  to ensure proper muscular activation.  Provided with signed orders from MD for both a wheelchair for safety in her home as well as for an AFO.  Patient provided with information about where to go to get these orders filled.  Pt will continue to benefit from skilled physical therapy intervention to address impairments, improve QOL, and attain therapy goals.        OBJECTIVE IMPAIRMENTS: Abnormal gait, decreased activity tolerance, decreased balance, decreased endurance, decreased knowledge of use of DME, decreased mobility, difficulty walking, decreased strength, decreased safety awareness, increased fascial restrictions, and obesity.   ACTIVITY LIMITATIONS: carrying, lifting, bending, standing, squatting, stairs, transfers, bed mobility, continence, bathing,  toileting, dressing, reach over head, hygiene/grooming, and caring for others  PARTICIPATION LIMITATIONS: meal prep, cleaning, laundry, driving, shopping, community activity, and yard work  PERSONAL FACTORS: Fitness, Past/current experiences, Time since onset of injury/illness/exacerbation, Transportation, and 3+ comorbidities: Kidney disease, DM2, HTN  are also affecting patient's functional outcome.   REHAB POTENTIAL: Fair pt is 2 years s/p CVA  CLINICAL DECISION MAKING: Stable/uncomplicated  EVALUATION COMPLEXITY: Moderate  PLAN:  PT FREQUENCY: 2x/week  PT DURATION: 12 weeks  PLANNED INTERVENTIONS: Therapeutic exercises, Therapeutic activity, Neuromuscular re-education, Balance training, Gait training, Patient/Family education, Self Care, Joint mobilization, Dry Needling, Wheelchair mobility training, and Manual therapy  PLAN FOR NEXT SESSION:   Give HEP,  continue with  assessment for balance level.  Perform transfers.   Particia Lather PT  02/13/22, 12:02 PM

## 2022-02-13 NOTE — Therapy (Signed)
OUTPATIENT OCCUPATIONAL THERAPY NEURO TREATMENT  Patient Name: Danielle Paul MRN: [id number] DOB:20-Jan-1956, 66 y.o., female Today's Date: 02/13/2022  PCP: Dr. Yves DillNeelam Khan REFERRING PROVIDER: Grayling CongressAmanda Shirley, FNP  END OF SESSION:  OT End of Session - 02/13/22 1038     Visit Number 2    Number of Visits 24    Date for OT Re-Evaluation 04/24/22    OT Start Time 0930    OT Stop Time 1015    OT Time Calculation (min) 45 min    Equipment Utilized During Treatment wc    Activity Tolerance Patient tolerated treatment well    Behavior [facility] for tasks assessed/performed             Past Medical History:  Diagnosis Date   Anxiety    Coronary artery disease    CVA (cerebral vascular accident) ([facility]) 01/2020   Diabetes mellitus without complication ([facility]CC)    Hypercholesteremia    Hypertension    Left-sided weakness    S/P stroke   Past Surgical History:  Procedure Laterality Date   AORTIC VALVE REPLACEMENT (AVR)/CORONARY ARTERY BYPASS GRAFTING (CABG)  2009   CAROTID ANGIOGRAPHY N/A 03/21/2020   Procedure: CAROTID ANGIOGRAPHY;  Surgeon: Annice Needyew, Jason S, MD;  Location: [facility];  Service: Cardiovascular;  Laterality: N/A;   Patient Active Problem List   Diagnosis Date Noted   Carotid stenosis 03/05/2020   CVA (cerebral vascular accident) ([facility]) 02/03/2020   TIA (transient ischemic attack) 02/02/2020   AKI (acute kidney injury) ([facility]) 02/02/2020   Acute lower UTI 02/02/2020   Diabetes mellitus without complication ([facility])    Hypertension    CKD (chronic kidney disease) stage 3, GFR 30-59 ml/min (HCC) 01/09/2016   Iron deficiency anemia 04/15/2014   Systolic heart failure, chronic (HCC) 03/28/2014   Obesity (BMI 30-39.9) 11/26/2012   Primary osteoarthritis of right hand 02/29/2012   Diabetic retinopathy associated with type 2 diabetes mellitus ([facility]CC) 06/17/2011   Esophageal dysmotility 06/17/2011   CAD (coronary artery disease), native coronary artery  10/01/2010   Cataracts, bilateral 10/01/2010   Hyperlipidemia 10/01/2010   Depression 09/30/2010   Sarcoidosis of lung ([facility]CC) 09/30/2010    ONSET DATE: 01/2020  REFERRING DIAG: Muscle weakness, stroke  THERAPY DIAG:  Left-sided weakness  Other lack of coordination  Rationale for Evaluation and Treatment: Rehabilitation  SUBJECTIVE SUBJECTIVE STATEMENT: Pt reports she was unable to secure a ride last week to make it to therapy. Pt accompanied by: self  PERTINENT HISTORY: Hx of CVA 2 years ago resulting in L sided spastic hemiplegia.  Pt reports limited therapy following CVA and wants to gain more indep with ADLs and mobility.  Other hx includes CAD, cataracts, depression, diabetic retinopathy due Type 2 diabetes mellitus, DM2, and HTN.    PRECAUTIONS: None  WEIGHT BEARING RESTRICTIONS: No  PAIN:  Are you having pain? Yes: NPRS scale: 3/10 Pain location: bilat knees, L shoulder Pain description: dull ache Aggravating factors: sitting in wc too long, lifting arm away from side during bathing  Relieving factors: repositioning, voltaren gel,  *8/10 pain in L shoulder with activity  FALLS: Has patient fallen in last 6 months? Yes. Number of falls pt estimates 1 or 2  .    LIVING ENVIRONMENT: Lives with: lives with their spouse Lives in: 2 level home  Stairs: Yes: Internal: 1 flight; ramp to enter house for front and back door steps; pt has stair lift Has following equipment at home: Wheelchair (manual), hemi-walker, shower chair with back/no  arm rests, walk in shower with grab bars, hand held shower hose but not yet installed, grab bars both sides of toilet, standard height toilet  PLOF: Independent  PATIENT GOALS: improve indep with self care and to walk  OBJECTIVE:   HAND DOMINANCE: Right  ADLs: Overall ADLs: from wc level Transfers/ambulation related to ADLs: supv-min A SPT wc<>toilet Eating: 1 handed with R dominant hand Grooming: set up to brush teeth, wash  face; dep for braiding hair UB Dressing: max A LB Dressing: max A; unable to don L shoe Toileting: mod A (clothing management), set up for hygiene Bathing: 1x per week in shower with min-mod A from tub bench; min A-mod A for sink bathing Tub Shower transfers: min A with tub bench Equipment: Transfer tub bench, Grab bars, and Walk in shower  IADLs: Shopping: cousin assists  Light housekeeping: pt does what she can from wc level Meal Prep: Meals on Wheels; pt can manage microwave and frozen meals in the oven.  Pt states she can do a little cooking from wc level. Spouse adjusts stove dials. Community mobility: reliant on wc and caregiver to propel community distances  Medication management: pt gets pill packs, spouse makes sure pt takes them Financial management: pt manages   MOBILITY STATUS: wc dependent  POSTURE COMMENTS:  rounded shoulders, L sided hemiparesis, mild L scapular winging, LUE flexor synergy pattern  FUNCTIONAL OUTCOME MEASURES: FOTO: to complete next session  UPPER EXTREMITY ROM:    Active ROM Right eval Left eval  Shoulder flexion WNL 0 (90)  Shoulder abduction WNL 55 (90)  Shoulder adduction    Shoulder extension WNL   Shoulder internal rotation WNL   Shoulder external rotation WNL 0 (35)  Elbow flexion    Elbow extension    Wrist flexion    Wrist extension    Wrist ulnar deviation    Wrist radial deviation    Wrist pronation    Wrist supination    (Blank rows = not tested)   UPPER EXTREMITY MMT:    MMT Right eval Left eval  Shoulder flexion 4+ 1  Shoulder abduction 4+ 2  Shoulder adduction    Shoulder extension    Shoulder internal rotation 4+ 1  Shoulder external rotation 4+ 1  Middle trapezius    Lower trapezius    Elbow flexion 4+ 1  Elbow extension 4+ 1  Wrist flexion 4+ 0  Wrist extension 4+ 0  Wrist ulnar deviation    Wrist radial deviation    Wrist pronation    Wrist supination    (Blank rows = not tested)  HAND FUNCTION:  LUE NT d/t spasticity Grip strength: Right: 63 lbs; Left: NT lbs, Lateral pinch: Right: 14 lbs, Left: NT lbs, and 3 point pinch: Right: 15 lbs, Left: NT lbs  COORDINATION: 9 Hole Peg test: Right: NT sec; Left: NT sec  SENSATION: Light touch: WFL Proprioception: Impaired   EDEMA: none  MUSCLE TONE: LUE: Moderate and Hypertonic  COGNITION: Overall cognitive status: No family/caregiver present to determine baseline cognitive functioning; some forgetfulness noted  VISION ASSESSMENT: To be further assessed in functional context  PERCEPTION: Not tested  PRAXIS: Impaired: Motor planning  TODAY'S TREATMENT:  Self Care: Reviewed bathing routines.  Pt reports that she uses her wc in the shower, and stores the wc in the garage to dry following her shower.  Pt reports she needs another wc, 1 for top floor and 1 for main floor so that spouse doesn't have to carry wc up the stairs when she wants to be on the second level.  Pt does have a stair lift for herself.  Communicated this to PT.  PT received referral for wc and will provide script to pt.  OT educated pt on shower chair options, specifically recommending transfer tub bench to enable pt to transfer from wc to shower bench.  Pt receptive to this.  Will plan to practice transfers on bench before ordering to ensure safety with transfer.   Reinforced importance of daily PROM to LUE in order to keep arm more mobile for self care, ie moving arm away from side to bathe under arm, thread arm through a shirt sleeve, apply deodorant, and to stretch fingers away from palm to ensure through hand hygiene.  Pt verbalized understanding and stated that she tends to have pain when her husband helps to bathe her under her arm.   Therapeutic Exercise:  Performed gentle PROM for L scapular depression and retraction, L shoulder flex/abd 0-90  degrees, L elbow flex/ext, forearm pron/sup, and wrist and digit flex/ext.  OT provided handout for PROM throughout the LUE.  Instructed pt in table slides for L shoulder abd within a pain free range, requiring min A for positioning.  Pt performs self passive shoulder flexion with hands clasped with cues to keep L shoulder at shoulder height or below d/t small sublux.  Pt able to return demo of self PROM throughout the L arm with mod vc for technique.  Pt will need review of handout.    *OT faxed prescription for L soft pro resting hand splint to PCP.  Pt was receptive to this and understands benefits of keeping fingers away from palm and wrist in neutral.    PATIENT EDUCATION: Education details: tub bench; resting hand splint; PROM throughout the LUE Person educated: Patient Education method: Explanation Education comprehension: verbalized understanding  HOME EXERCISE PROGRAM: Self PROM throughout the LUE   GOALS: Goals reviewed with patient? Yes  SHORT TERM GOALS: Target date: 03/13/22 (6 weeks)  Pt will perform HEP for maximizing PROM throughout the LUE to reduce pain with ADLs. Baseline: Not yet initiated Goal status: INITIAL  2.  Pt will demo indep with donning/doffing palm protector or splint and consistently follow wearing schedule to prevent L hand flexion contracture and promote healthy skin integrity. Baseline: Not yet issued Goal status: INITIAL  3. Pt will tolerate standing with RUE support for 2 min and min guard to promote standing ADLs. Baseline: ADLs performed in sitting Goal status: initial  LONG TERM GOALS: Target date: 04/24/22 (12 weeks)  Pt will increase FOTO score to indicate clinically relevant change related to greater indep with daily tasks.  Baseline: To be completed 2nd visit Goal status: INITIAL  2.  Pt will perform UB dressing with min A. Baseline: Max A UB dressing Goal status: INITIAL  3.  Pt will perform LB dressing with mod A. Baseline: Max A LB  dressing Goal status: INITIAL  4.  Pt will stand to brush teeth with set up and min guard. Baseline: Performs in sitting  Goal status: INITIAL  5.  Pt will complete wc<>toilet transfer with modified indep Baseline: supv-min A using grab bar  Goal status: INITIAL  6.  Pt will complete clothing management for toileting tasks with supv. Baseline: mod-max A Goal status: INITIAL  ASSESSMENT:  CLINICAL IMPRESSION: Pt tolerated all PROM throughout the LUE well this date.  OT provided handout for self PROM and reinforced importance of daily stretching to keep LUE mobile for self care.  Pt required mod vc/tactile cues for PROM techniques.  Will need review of handout.  Faxed script for L soft pro resting hand splint.  Pt receptive to this.  May benefit from transfer tub bench, but will plan to practice tub bench transfers in the clinic to ensure safety before ordering bench for home.  Pt will continue to benefit from instruction in self PROM to reduce pain and stiffness in L shoulder and throughout the LUE, and positioning strategies for L hemiparetic limb.  OT focus will be on maximizing indep with basic self care with instruction in 1 handed strategies, and functional transfer training to maximize indep and reduce fall risk.     PERFORMANCE DEFICITS: in functional skills including ADLs, IADLs, coordination, dexterity, proprioception, sensation, tone, ROM, strength, pain, flexibility, Fine motor control, Gross motor control, mobility, balance, body mechanics, decreased knowledge of use of DME, and UE functional use, cognitive skills including memory.  IMPAIRMENTS: are limiting patient from ADLs and IADLs.   CO-MORBIDITIES: may have co-morbidities  that affects occupational performance. Patient will benefit from skilled OT to address above impairments and improve overall function.  MODIFICATION OR ASSISTANCE TO COMPLETE EVALUATION: Min-Moderate modification of tasks or assist with assess necessary to  complete an evaluation.  OT OCCUPATIONAL PROFILE AND HISTORY: Problem focused assessment: Including review of records relating to presenting problem.  CLINICAL DECISION MAKING: Moderate - several treatment options, min-mod task modification necessary  REHAB POTENTIAL: Fair 2 years post CVA; limited family support (pt reports spouse has some dementia); pt must rely on community transit to get to appointments  EVALUATION COMPLEXITY: Moderate    PLAN:  OT FREQUENCY: 2x/week  OT DURATION: 12 weeks  PLANNED INTERVENTIONS: self care/ADL training, therapeutic exercise, therapeutic activity, neuromuscular re-education, manual therapy, passive range of motion, balance training, functional mobility training, splinting, moist heat, cryotherapy, patient/family education, and DME and/or AE instructions  RECOMMENDED OTHER SERVICES: N/A  CONSULTED AND AGREED WITH PLAN OF CARE: Patient  PLAN FOR NEXT SESSION: see above  Danelle Earthly, MS, OTR/L  Otis Dials, OT 02/13/2022, 10:40 AM

## 2022-02-20 ENCOUNTER — Ambulatory Visit: Payer: Medicare Other | Admitting: Physical Therapy

## 2022-02-20 ENCOUNTER — Ambulatory Visit: Payer: Medicare Other | Admitting: Occupational Therapy

## 2022-02-20 NOTE — Therapy (Deleted)
OUTPATIENT PHYSICAL THERAPY NEURO TREATMENT   Patient Name: Danielle Paul MRN: [id number] DOB:Jan 16, 1956, 66 y.o., female Today's Date: 02/20/2022   PCP: Margaretann Loveless, MD REFERRING PROVIDER: Miki Kins, FNP       Past Medical History:  Diagnosis Date   Anxiety    Coronary artery disease    CVA (cerebral vascular accident) (HCC) 01/2020   Diabetes mellitus without complication (HCC)    Hypercholesteremia    Hypertension    Left-sided weakness    S/P stroke   Past Surgical History:  Procedure Laterality Date   AORTIC VALVE REPLACEMENT (AVR)/CORONARY ARTERY BYPASS GRAFTING (CABG)  2009   CAROTID ANGIOGRAPHY N/A 03/21/2020   Procedure: CAROTID ANGIOGRAPHY;  Surgeon: Annice Needy, MD;  Location: [facility];  Service: Cardiovascular;  Laterality: N/A;   Patient Active Problem List   Diagnosis Date Noted   Carotid stenosis 03/05/2020   CVA (cerebral vascular accident) (HCC) 02/03/2020   TIA (transient ischemic attack) 02/02/2020   AKI (acute kidney injury) (HCC) 02/02/2020   Acute lower UTI 02/02/2020   Diabetes mellitus without complication (HCC)    Hypertension    CKD (chronic kidney disease) stage 3, GFR 30-59 ml/min (HCC) 01/09/2016   Iron deficiency anemia 04/15/2014   Systolic heart failure, chronic (HCC) 03/28/2014   Obesity (BMI 30-39.9) 11/26/2012   Primary osteoarthritis of right hand 02/29/2012   Diabetic retinopathy associated with type 2 diabetes mellitus (HCC) 06/17/2011   Esophageal dysmotility 06/17/2011   CAD (coronary artery disease), native coronary artery 10/01/2010   Cataracts, bilateral 10/01/2010   Hyperlipidemia 10/01/2010   Depression 09/30/2010   Sarcoidosis of lung (HCC) 09/30/2010    ONSET DATE: 01/2020 CVA  REFERRING DIAG: Muscle weakness, Stroke  THERAPY DIAG:  No diagnosis found.  Rationale for Evaluation and Treatment: Rehabilitation  SUBJECTIVE:                                                                                                                                                                                              SUBJECTIVE STATEMENT:  Pt denies any falls since last visit but is curious as to when her OT evaluation is.  Printed scheduled for pt to prepare for evaluation next Monday.   Pt accompanied by: self  PERTINENT HISTORY: Danielle Paul is a 66 y.o. with a complaint of imbalance and LE weakness s/p CVA in 2021.  Pt has PMH including: CAD, cataracts, depression, diabetic retinopathy due Type 2 diabetes mellitus, DM2, and HTN.  PAIN:  Are you having pain? No  PRECAUTIONS: None  WEIGHT BEARING RESTRICTIONS: No  FALLS: Has patient fallen in  last 6 months? Yes. Number of falls 5-6  LIVING ENVIRONMENT: Lives with: lives with their spouse Lives in: House/apartment Stairs: Yes: Internal: 13 steps; pt has a chair to take her upstairs Has following equipment at home: Single point cane, Walker - 4 wheeled, Hemi walker, Wheelchair (manual), shower chair, bed side commode, Grab bars, and Ramped entry  PLOF: Needs assistance with ADLs, Needs assistance with homemaking, Needs assistance with gait, and Needs assistance with transfers  PATIENT GOALS: Return to PLOF and be able to walk again and be independent.  OBJECTIVE:   DIAGNOSTIC FINDINGS: N/A  BP: 157/67 mmHg; HR: 66  COGNITION: Overall cognitive status: Within functional limits for tasks assessed.   SENSATION: WFL  COORDINATION: WFL  POSTURE: rounded shoulders, forward head, increased lumbar lordosis, and weight shift left  LOWER EXTREMITY ROM:     Active  Right Eval Left Eval  Hip flexion    Hip extension    Hip abduction    Hip adduction    Hip internal rotation    Hip external rotation    Knee flexion    Knee extension    Ankle dorsiflexion    Ankle plantarflexion    Ankle inversion    Ankle eversion     (Blank rows = not tested)   LOWER EXTREMITY MMT:    MMT Right Eval Left Eval  Hip  flexion 4- 3-  Hip extension    Hip abduction 4- 3-  Hip adduction 4- 3-  Hip internal rotation    Hip external rotation    Knee flexion 4- 3-  Knee extension 4- 3-  Ankle dorsiflexion 5 4-  Ankle plantarflexion    Ankle inversion    Ankle eversion    (Blank rows = not tested)  L LE unable to perform again gravity   TRANSFERS: Assistive device utilized: Hemi walker  Sit to stand: Mod A Stand to sit: Min A   FUNCTIONAL TESTS:  5 times sit to stand: Unable to perform at this time.  PATIENT SURVEYS:  FOTO 26/37   TODAY'S TREATMENT:                                                                                                                               Neuro:  In // bars:  Patient assisted to standing with min assist or to come to standing position as well as with blocking of left knee and lower extremity.  Standing weight shifts to each side to promote proprioception of the L LE and weight tolerance of the L LE,  Standing heel raise on right lower extremity to improve her left lower extremity weight shifting for progressing to taking steps.  Completed 2 x 10 reps with seated rest break in between.  Patient encouraged to perform heel raise and then attempt to take step with right lower extremity.  Physical therapist provides support to left lower extremity to prevent knee buckling and patient is able to  take 2 steps with each lower extremity prior to needing seated rest break   TherEx:  Access Code: [email or web address] URL: [email or web address] Date: 02/20/2022 Prepared by: Thresa Rosshristopher Ahmaud Duthie  Exercises - Seated Heel Raise  - 1 x daily - 7 x weekly - 3 sets - 15 reps - 2 sec hold - Seated Hip Adduction Isometrics with Ball  - 1 x daily - 7 x weekly - 2 sets - 10 reps - 5 sec hold - Seated March  - 1 x daily - 7 x weekly - 3 sets - 10 reps - Seated Gluteal Sets  - 1 x daily - 7 x weekly - 2 sets - 15 reps - 5 second hold  Seated marches, 2x10 with AAROM  applied by the therapist on the L, 3# AW applied to the right to increase resistance and muscular challenge  Seated LAQ, 2x15 with limited Rom on the left and with Max A from PT for assisted hip flexion to allow foot clearance.  Heel raises, 2x15 each LE Seated isometric abduction/adduction into physioball/pressure applied by therapist, 3 sec holds, 2x15 each LE     PATIENT EDUCATION: Education details: Pt educated throughout session about proper posture and technique with exercises. Improved exercise technique, movement at target joints, use of target muscles after min to mod verbal, visual, tactile cues.  Person educated: Patient Education method: Explanation Education comprehension: verbalized understanding  HOME EXERCISE PROGRAM: Access Code: [email or web address] URL: [email or web address] Date: 02/20/2022 Prepared by: Thresa Rosshristopher Archimedes Harold  Exercises - Seated Heel Raise  - 1 x daily - 7 x weekly - 3 sets - 15 reps - 2 sec hold - Seated Hip Adduction Isometrics with Ball  - 1 x daily - 7 x weekly - 2 sets - 10 reps - 5 sec hold - Seated March  - 1 x daily - 7 x weekly - 3 sets - 10 reps - Seated Gluteal Sets  - 1 x daily - 7 x weekly - 2 sets - 15 reps - 5 second hold  GOALS: Goals reviewed with patient? Yes  SHORT TERM GOALS: Target date: 02/05/2022  Pt will be independent with HEP in order to demonstrate increased ability to perform tasks related to occupation/hobbies. Baseline:  Pt to be given HEP at next session Goal status: INITIAL   LONG TERM GOALS: Target date: 04/02/2022  1.  Patient (> 66 years old) will complete one STS with modified independence indicating an increased LE strength and improved balance. Baseline: Unable to perform without modA from therapist Goal status: INITIAL  2.  Patient will increase FOTO score to equal to or greater than 37 to demonstrate statistically significant improvement in mobility and quality of life.  Baseline: FOTO: 26 Goal status:  INITIAL   3.  Patient will increase MMT of B LE by 1/2 grade to demonstrate improved strength necessary for safe mobility/transfers. Baseline: Gross L LE: 3-/5 Goal status: INITIAL     ASSESSMENT:  CLINICAL IMPRESSION:  Pt presents with great motivation to complete PT activities. Continued with current plan of care as laid out in evaluation and recent prior sessions. Pt remains motivated to advance progress toward goals in order to maximize independence and safety at home. Pt requires high level assistance and cuing for completion of exercises in order to provide adequate level of stimulation challenge  to ensure proper muscular activation.  Provided with signed orders from MD for both a wheelchair for safety in her home as well as for an AFO.  Patient provided with  information about where to go to get these orders filled.  Pt will continue to benefit from skilled physical therapy intervention to address impairments, improve QOL, and attain therapy goals.        OBJECTIVE IMPAIRMENTS: Abnormal gait, decreased activity tolerance, decreased balance, decreased endurance, decreased knowledge of use of DME, decreased mobility, difficulty walking, decreased strength, decreased safety awareness, increased fascial restrictions, and obesity.   ACTIVITY LIMITATIONS: carrying, lifting, bending, standing, squatting, stairs, transfers, bed mobility, continence, bathing, toileting, dressing, reach over head, hygiene/grooming, and caring for others  PARTICIPATION LIMITATIONS: meal prep, cleaning, laundry, driving, shopping, community activity, and yard work  PERSONAL FACTORS: Fitness, Past/current experiences, Time since onset of injury/illness/exacerbation, Transportation, and 3+ comorbidities: Kidney disease, DM2, HTN  are also affecting patient's functional outcome.   REHAB POTENTIAL: Fair pt is 2 years s/p CVA  CLINICAL DECISION MAKING: Stable/uncomplicated  EVALUATION COMPLEXITY:  Moderate  PLAN:  PT FREQUENCY: 2x/week  PT DURATION: 12 weeks  PLANNED INTERVENTIONS: Therapeutic exercises, Therapeutic activity, Neuromuscular re-education, Balance training, Gait training, Patient/Family education, Self Care, Joint mobilization, Dry Needling, Wheelchair mobility training, and Manual therapy  PLAN FOR NEXT SESSION:   Give HEP,  continue with assessment for balance level.  Perform transfers.   Norman Herrlich PT  02/20/22, 7:57 AM

## 2022-02-21 DIAGNOSIS — N189 Chronic kidney disease, unspecified: Secondary | ICD-10-CM | POA: Diagnosis not present

## 2022-02-21 DIAGNOSIS — I509 Heart failure, unspecified: Secondary | ICD-10-CM | POA: Diagnosis not present

## 2022-02-21 DIAGNOSIS — I130 Hypertensive heart and chronic kidney disease with heart failure and stage 1 through stage 4 chronic kidney disease, or unspecified chronic kidney disease: Secondary | ICD-10-CM | POA: Diagnosis not present

## 2022-02-27 ENCOUNTER — Ambulatory Visit: Payer: Medicare Other | Attending: Internal Medicine | Admitting: Occupational Therapy

## 2022-02-27 ENCOUNTER — Telehealth: Payer: Self-pay | Admitting: Physical Therapy

## 2022-02-27 ENCOUNTER — Ambulatory Visit: Payer: Medicare Other | Attending: Family | Admitting: Physical Therapy

## 2022-02-27 NOTE — Telephone Encounter (Signed)
Pt contacted via telephone and author left voice mail informing of missed appointment and informed pt of future PT appointment date and time. She was instructed to call and confirm she will be present for this appointment as she has missed her last 2 visits.   Rivka Barbara PT, DPT

## 2022-03-02 ENCOUNTER — Encounter: Payer: Medicare Other | Admitting: Occupational Therapy

## 2022-03-02 ENCOUNTER — Ambulatory Visit: Payer: Medicare Other

## 2022-03-06 ENCOUNTER — Ambulatory Visit: Payer: Medicare Other

## 2022-03-06 ENCOUNTER — Ambulatory Visit: Payer: Medicare Other | Admitting: Physical Therapy

## 2022-03-09 ENCOUNTER — Encounter: Payer: Medicare Other | Admitting: Occupational Therapy

## 2022-03-09 ENCOUNTER — Ambulatory Visit: Payer: Medicare Other

## 2022-03-13 ENCOUNTER — Ambulatory Visit: Payer: Medicare Other

## 2022-03-13 ENCOUNTER — Ambulatory Visit: Payer: Medicare Other | Admitting: Physical Therapy

## 2022-03-16 ENCOUNTER — Ambulatory Visit: Payer: Medicare Other

## 2022-03-16 ENCOUNTER — Encounter: Payer: Medicare Other | Admitting: Occupational Therapy

## 2022-03-20 ENCOUNTER — Ambulatory Visit: Payer: Medicare Other | Admitting: Physical Therapy

## 2022-03-24 DIAGNOSIS — I130 Hypertensive heart and chronic kidney disease with heart failure and stage 1 through stage 4 chronic kidney disease, or unspecified chronic kidney disease: Secondary | ICD-10-CM

## 2022-03-24 DIAGNOSIS — N189 Chronic kidney disease, unspecified: Secondary | ICD-10-CM

## 2022-03-24 DIAGNOSIS — I509 Heart failure, unspecified: Secondary | ICD-10-CM

## 2022-03-27 ENCOUNTER — Ambulatory Visit: Payer: Medicare Other | Admitting: Physical Therapy

## 2022-04-03 ENCOUNTER — Ambulatory Visit: Payer: Medicare Other | Admitting: Physical Therapy

## 2022-04-10 ENCOUNTER — Ambulatory Visit: Payer: Medicare Other | Admitting: Physical Therapy

## 2022-04-13 ENCOUNTER — Telehealth: Payer: Self-pay

## 2022-04-13 NOTE — Telephone Encounter (Signed)
Diabetes (DM) Review Call  Danielle Paul,Danielle Paul  66 years, Female  DOB: April 24, 1955  M: [phone]  __________________________________________________ Diabetes (DM) Review (HC) Chart Review A1C Reading #1 (last): 6.4 on: 09/04/2021 A1C Reading #2: 9.1 on: 05/01/2021 When was patient's last eye exam?: About 1 year ago  The patient's glycemic control has: Improved What recent interventions have been made by any provider to improve the patient's conditions in the last 3 months?: None . Has there been any documented recent hospitalizations or ED visits since last visit with Clinical Lead?: No Is the patient currently on a STATIN medication?: Yes Is the patient currently on ACE/ARB medication?: No Adherence Review Does the [facility] have access to medication refill data?: Yes Adherence rates for STAR metric medications: Rosuvastatin 40 mg - 02/09/22 30 DS Farxiga 10 mg - Free Adherence rates for medications indicated for disease state being reviewed: Farxiga 10 mg - Does the patient have >5 day gap between last estimated fill dates for any of the above medications?: Yes Reasons for medication gaps: Rosuvastatin 40 mg - 02/09/22 30 DS - Patient has not been seen since Aug of 2023 and needs to be seen and I continue to advise her to do so. Farxiga 10 mg - Samples -  Patient has not been seen since Aug of 2023 and needs to be seen and I continue to advise her to do so. Disease State Questions Able to connect with the Patient?: Yes Are you currently checking your blood sugars?: No Review recommendations from CPP of how often should be checking and encourage monitoring blood sugars.: Done Is the patient having any signs or symptoms of low blood sugars (breaking out in sweat, shaky, confusion, dizziness)?: No Is the patient checking their feet daily/regularly?: Yes Are there any cuts, swelling, blisters, redness, or any signs of infections?: No When was your last dentist appointment? (6 Month  recommendation): Dentures  What diet changes have you made to improve your Blood Sugar level?: limiting processed foods, eating more home-cooked meals What exercise are you doing to improve your Blood Sugar level?: no formal exercise Misc. Response/Information:: R/S Appointment to 10/05/22 at [place] . - Patient needs help with transportation , she is going to call me with her appointment date and time so I can help her with transportation for her Doctors appointment and possibly her CCM appointment . Engagement Notes Jerral Ralph on 04/13/2022 07:19 PM reviewed 24mns 6secs McLemore, Veronica on 04/07/2022 03:17 PM [facility] Review: 8 min 04/07/22  [facility]essment [facility] time spent: 17 min 04/13/22   HBurman Riison 03/27/2022 12:02 PM pt was scheduled for 3/12, please r/s to sometime in april  Clinical Lead Review Review Adherence gaps identified?: No Drug Therapy Problems identified?: No Assessment: Controlled

## 2022-04-17 ENCOUNTER — Ambulatory Visit: Payer: Medicare Other | Admitting: Physical Therapy

## 2022-04-21 ENCOUNTER — Ambulatory Visit (INDEPENDENT_AMBULATORY_CARE_PROVIDER_SITE_OTHER): Payer: Medicare HMO | Admitting: Internal Medicine

## 2022-04-21 ENCOUNTER — Encounter: Payer: Self-pay | Admitting: Internal Medicine

## 2022-04-21 VITALS — BP 132/70 | HR 61

## 2022-04-21 DIAGNOSIS — E559 Vitamin D deficiency, unspecified: Secondary | ICD-10-CM

## 2022-04-21 DIAGNOSIS — E782 Mixed hyperlipidemia: Secondary | ICD-10-CM

## 2022-04-21 DIAGNOSIS — M8580 Other specified disorders of bone density and structure, unspecified site: Secondary | ICD-10-CM

## 2022-04-21 DIAGNOSIS — E1165 Type 2 diabetes mellitus with hyperglycemia: Secondary | ICD-10-CM | POA: Diagnosis not present

## 2022-04-21 DIAGNOSIS — I25718 Atherosclerosis of autologous vein coronary artery bypass graft(s) with other forms of angina pectoris: Secondary | ICD-10-CM | POA: Diagnosis not present

## 2022-04-21 DIAGNOSIS — Z780 Asymptomatic menopausal state: Secondary | ICD-10-CM | POA: Diagnosis not present

## 2022-04-21 DIAGNOSIS — Z794 Long term (current) use of insulin: Secondary | ICD-10-CM | POA: Diagnosis not present

## 2022-04-21 DIAGNOSIS — I63133 Cerebral infarction due to embolism of bilateral carotid arteries: Secondary | ICD-10-CM

## 2022-04-21 DIAGNOSIS — I10 Essential (primary) hypertension: Secondary | ICD-10-CM | POA: Diagnosis not present

## 2022-04-21 LAB — POCT CBG (FASTING - GLUCOSE)-MANUAL ENTRY: Glucose Fasting, POC: 268 mg/dL — AB (ref 70–99)

## 2022-04-21 NOTE — Progress Notes (Signed)
Established Patient Office Visit  Subjective:  Patient ID: Danielle Paul, female    DOB: 24-May-1955  Age: 67 y.o. MRN: [id number]  No chief complaint on file.   Patient comes in for a follow-up.  She has not been in the office since the last year.  She did not undergo cataract surgery. Today she complains about right-sided headache, and she has not eaten all day as she was fasting for her blood work.  She denies any chest pain or shortness of breath.  We will check her blood work today and then she is advised to return and discuss the results.     Past Medical History:  Diagnosis Date   Anxiety    Coronary artery disease    CVA (cerebral vascular accident) ([place]) 01/2020   Diabetes mellitus without complication (HCC)    Hypercholesteremia    Hypertension    Left-sided weakness    S/P stroke    Social History   Socioeconomic History   Marital status: Unknown    Spouse name: Not on file   Number of children: Not on file   Years of education: Not on file   Highest education level: Not on file  Occupational History   Not on file  Tobacco Use   Smoking status: Never   Smokeless tobacco: Never  Vaping Use   Vaping Use: Never used  Substance and Sexual Activity   Alcohol use: Never   Drug use: Never   Sexual activity: Not on file  Other Topics Concern   Not on file  Social History Narrative   Not on file   Social Determinants of Health   Financial Resource Strain: Not on file  Food Insecurity: No Food Insecurity (10/17/2021)   Hunger Vital Sign    Worried About Running Out of Food in the Last Year: Never true    Ran Out of Food in the Last Year: Never true  Transportation Needs: No Transportation Needs (10/17/2021)   PRAPARE - Hydrologist (Medical): No    Lack of Transportation (Non-Medical): No  Recent Concern: Transportation Needs - Unmet Transportation Needs (10/17/2021)   PRAPARE - Hydrologist  (Medical): Yes    Lack of Transportation (Non-Medical): Yes  Physical Activity: Not on file  Stress: Not on file  Social Connections: Not on file  Intimate Partner Violence: Not on file   Past Surgical History:  Procedure Laterality Date   AORTIC VALVE REPLACEMENT (AVR)/CORONARY ARTERY BYPASS GRAFTING (CABG)  2009   CAROTID ANGIOGRAPHY N/A 03/21/2020   Procedure: CAROTID ANGIOGRAPHY;  Surgeon: Algernon Huxley, MD;  Location: [facility];  Service: Cardiovascular;  Laterality: N/A;    Family History  Problem Relation Age of Onset   Other Mother    Hypertension Mother    Hypertension Father     Allergies  Allergen Reactions   Atorvastatin Other (See Comments)    Muscle Pains   Isosorbide Nitrate Other (See Comments)    Jittery and lightheadedness.   Pravastatin Other (See Comments)   Simvastatin Other (See Comments)    Muscle Pains    Review of Systems  Constitutional:  Positive for diaphoresis and malaise/fatigue. Negative for chills, fever and weight loss.  HENT:  Negative for congestion, ear discharge, ear pain, hearing loss, nosebleeds, sinus pain, sore throat and tinnitus.   Eyes:  Positive for blurred vision. Negative for double vision, photophobia, pain, discharge and redness.  Respiratory: Negative.  Negative  for stridor.   Cardiovascular:  Negative for chest pain, palpitations and PND.  Gastrointestinal: Negative.   Genitourinary: Negative.   Musculoskeletal: Negative.   Skin: Negative.   Neurological: Negative.   Psychiatric/Behavioral: Negative.         Objective:   BP 132/70   Pulse 61   SpO2 99%   Vitals:   04/21/22 1337  BP: 132/70  Pulse: 61  SpO2: 99%    Physical Exam Constitutional:      Appearance: Normal appearance. She is obese.  HENT:     Head: Normocephalic and atraumatic.  Cardiovascular:     Rate and Rhythm: Normal rate and regular rhythm.  Pulmonary:     Effort: Pulmonary effort is normal.     Breath sounds: Normal  breath sounds.  Abdominal:     General: Abdomen is flat.     Palpations: Abdomen is soft.  Musculoskeletal:        General: Normal range of motion.     Cervical back: Normal range of motion and neck supple.  Skin:    General: Skin is warm.  Neurological:     Mental Status: She is alert and oriented to person, place, and time.  Psychiatric:        Mood and Affect: Mood normal.      Results for orders placed or performed in visit on 04/21/22  POCT CBG (Fasting - Glucose)  Result Value Ref Range   Glucose Fasting, POC 268 (A) 70 - 99 mg/dL    Recent Results (from the past 2160 hour(s))  POCT CBG (Fasting - Glucose)     Status: Abnormal   Collection Time: 04/21/22  1:37 PM  Result Value Ref Range   Glucose Fasting, POC 268 (A) 70 - 99 mg/dL      Assessment & Plan:  Patient will get her labs right now and that she is advised to go home and eat her lunch.  She is also going to take a Tylenol for her headache.  Patient will return in 1 week so we can discuss the results and and further evaluate the headache if she continues to have 1.  Her vision is a problem since she did not get her cataract surgery done last year.  Will set up an ophthalmology consult at follow-up . Problem List Items Addressed This Visit     Diabetes mellitus without complication ([place]) - Primary   Essential hypertension, benign   Relevant Orders   CMP14+EGFR   Hyperlipidemia   Relevant Orders   Lipid Panel w/o Chol/HDL Ratio   Osteopenia after menopause   Relevant Orders   DG Bone Density   Cerebral infarction due to bilateral embolism of carotid arteries (HCC)   Vitamin D deficiency   Relevant Orders   Vitamin D (25 hydroxy)   Coronary artery disease of autologous vein bypass graft with stable angina pectoris ([place])    Return in about 1 week (around 04/28/2022).   Total time spent: 30 minutes  Perrin Maltese, MD  04/21/2022

## 2022-04-22 LAB — CMP14+EGFR
ALT: 12 IU/L (ref 0–32)
AST: 13 IU/L (ref 0–40)
Albumin/Globulin Ratio: 1.5 (ref 1.2–2.2)
Albumin: 3.9 g/dL (ref 3.9–4.9)
Alkaline Phosphatase: 113 IU/L (ref 44–121)
BUN/Creatinine Ratio: 16 (ref 12–28)
BUN: 19 mg/dL (ref 8–27)
Bilirubin Total: 0.3 mg/dL (ref 0.0–1.2)
CO2: 24 mmol/L (ref 20–29)
Calcium: 9.5 mg/dL (ref 8.7–10.3)
Chloride: 100 mmol/L (ref 96–106)
Creatinine, Ser: 1.18 mg/dL — ABNORMAL HIGH (ref 0.57–1.00)
Globulin, Total: 2.6 g/dL (ref 1.5–4.5)
Glucose: 246 mg/dL — ABNORMAL HIGH (ref 70–99)
Potassium: 4.5 mmol/L (ref 3.5–5.2)
Sodium: 138 mmol/L (ref 134–144)
Total Protein: 6.5 g/dL (ref 6.0–8.5)
eGFR: 51 mL/min/{1.73_m2} — ABNORMAL LOW (ref >59)

## 2022-04-22 LAB — VITAMIN D 25 HYDROXY (VIT D DEFICIENCY, FRACTURES): Vit D, 25-Hydroxy: 34.5 ng/mL (ref 30.0–100.0)

## 2022-04-22 LAB — LIPID PANEL W/O CHOL/HDL RATIO
Cholesterol, Total: 237 mg/dL — ABNORMAL HIGH (ref 100–199)
HDL: 44 mg/dL (ref >39)
LDL Chol Calc (NIH): 178 mg/dL — ABNORMAL HIGH (ref 0–99)
Triglycerides: 85 mg/dL (ref 0–149)
VLDL Cholesterol Cal: 15 mg/dL (ref 5–40)

## 2022-04-22 LAB — HEMOGLOBIN A1C
Est. average glucose Bld gHb Est-mCnc: 240 mg/dL
Hgb A1c MFr Bld: 10 % — ABNORMAL HIGH (ref 4.8–5.6)

## 2022-04-23 DIAGNOSIS — I130 Hypertensive heart and chronic kidney disease with heart failure and stage 1 through stage 4 chronic kidney disease, or unspecified chronic kidney disease: Secondary | ICD-10-CM | POA: Diagnosis not present

## 2022-04-23 DIAGNOSIS — N189 Chronic kidney disease, unspecified: Secondary | ICD-10-CM | POA: Diagnosis not present

## 2022-04-23 DIAGNOSIS — I509 Heart failure, unspecified: Secondary | ICD-10-CM | POA: Diagnosis not present

## 2022-04-24 ENCOUNTER — Other Ambulatory Visit: Payer: Self-pay | Admitting: Internal Medicine

## 2022-04-24 ENCOUNTER — Ambulatory Visit: Payer: Medicare Other | Admitting: Physical Therapy

## 2022-04-24 DIAGNOSIS — I209 Angina pectoris, unspecified: Secondary | ICD-10-CM

## 2022-04-28 ENCOUNTER — Encounter: Payer: Self-pay | Admitting: Internal Medicine

## 2022-04-28 ENCOUNTER — Ambulatory Visit (INDEPENDENT_AMBULATORY_CARE_PROVIDER_SITE_OTHER): Payer: Medicare HMO | Admitting: Internal Medicine

## 2022-04-28 VITALS — BP 118/80 | HR 63 | Ht 62.0 in | Wt 185.0 lb

## 2022-04-28 DIAGNOSIS — I25110 Atherosclerotic heart disease of native coronary artery with unstable angina pectoris: Secondary | ICD-10-CM | POA: Diagnosis not present

## 2022-04-28 DIAGNOSIS — E669 Obesity, unspecified: Secondary | ICD-10-CM

## 2022-04-28 DIAGNOSIS — I10 Essential (primary) hypertension: Secondary | ICD-10-CM | POA: Diagnosis not present

## 2022-04-28 DIAGNOSIS — E559 Vitamin D deficiency, unspecified: Secondary | ICD-10-CM | POA: Diagnosis not present

## 2022-04-28 DIAGNOSIS — E782 Mixed hyperlipidemia: Secondary | ICD-10-CM | POA: Diagnosis not present

## 2022-04-28 DIAGNOSIS — E1165 Type 2 diabetes mellitus with hyperglycemia: Secondary | ICD-10-CM

## 2022-04-28 DIAGNOSIS — Z794 Long term (current) use of insulin: Secondary | ICD-10-CM | POA: Diagnosis not present

## 2022-04-28 LAB — POCT CBG (FASTING - GLUCOSE)-MANUAL ENTRY: Glucose Fasting, POC: 217 mg/dL — AB (ref 70–99)

## 2022-04-28 MED ORDER — SEMAGLUTIDE(0.25 OR 0.5MG/DOS) 2 MG/3ML ~~LOC~~ SOPN: 0.2500 mg | PEN_INJECTOR | SUBCUTANEOUS | 3 refills | Status: DC

## 2022-04-28 NOTE — Progress Notes (Signed)
Established Patient Office Visit  Subjective:  Patient ID: Danielle Paul, female    DOB: 1956-01-02  Age: 67 y.o. MRN: [id number]  Chief Complaint  Patient presents with   Follow-up    1 week follow up    Patient comes in for her 1 week follow-up so she can discuss her lab results.  Her hemoglobin A1c was found to be very high at 10.0.  Patient admits to dietary indiscretions.  At the last visit her medications were adjusted including Lantus and Humalog sliding scale.  Today patient asked if she could be started on Ozempic.  Due to her stroke she is not very active.  She is mostly wheelchair-bound.  Will try to set up physical therapy especially for her left arm.  Patient will also benefit from home health care including home PT and OT.     Past Medical History:  Diagnosis Date   Anxiety    Coronary artery disease    CVA (cerebral vascular accident) ([place]) 01/2020   Diabetes mellitus without complication ([place])    Hypercholesteremia    Hypertension    Left-sided weakness    S/P stroke    Past Surgical History:  Procedure Laterality Date   AORTIC VALVE REPLACEMENT (AVR)/CORONARY ARTERY BYPASS GRAFTING (CABG)  2009   CAROTID ANGIOGRAPHY N/A 03/21/2020   Procedure: CAROTID ANGIOGRAPHY;  Surgeon: Algernon Huxley, MD;  Location: [facility];  Service: Cardiovascular;  Laterality: N/A;    Social History   Socioeconomic History   Marital status: Unknown    Spouse name: Not on file   Number of children: Not on file   Years of education: Not on file   Highest education level: Not on file  Occupational History   Not on file  Tobacco Use   Smoking status: Never   Smokeless tobacco: Never  Vaping Use   Vaping Use: Never used  Substance and Sexual Activity   Alcohol use: Never   Drug use: Never   Sexual activity: Not on file  Other Topics Concern   Not on file  Social History Narrative   Not on file   Social Determinants of Health   Financial Resource Strain: Not  on file  Food Insecurity: No Food Insecurity (10/17/2021)   Hunger Vital Sign    Worried About Running Out of Food in the Last Year: Never true    Ran Out of Food in the Last Year: Never true  Transportation Needs: No Transportation Needs (10/17/2021)   PRAPARE - Hydrologist (Medical): No    Lack of Transportation (Non-Medical): No  Recent Concern: Transportation Needs - Unmet Transportation Needs (10/17/2021)   PRAPARE - Hydrologist (Medical): Yes    Lack of Transportation (Non-Medical): Yes  Physical Activity: Not on file  Stress: Not on file  Social Connections: Not on file  Intimate Partner Violence: Not on file    Family History  Problem Relation Age of Onset   Other Mother    Hypertension Mother    Hypertension Father     Allergies  Allergen Reactions   Atorvastatin Other (See Comments)    Muscle Pains   Isosorbide Nitrate Other (See Comments)    Jittery and lightheadedness.   Pravastatin Other (See Comments)   Simvastatin Other (See Comments)    Muscle Pains    Review of Systems  Constitutional:  Positive for malaise/fatigue. Negative for chills and fever.  HENT:  Positive for hearing  loss. Negative for congestion, ear pain, sinus pain, sore throat and tinnitus.   Eyes:  Positive for blurred vision. Negative for double vision, photophobia, pain and discharge.  Respiratory: Negative.  Negative for stridor.   Cardiovascular:  Positive for palpitations and leg swelling. Negative for claudication and PND.  Gastrointestinal: Negative.   Genitourinary: Negative.   Musculoskeletal:  Positive for myalgias.  Skin: Negative.   Neurological:  Positive for dizziness, tremors and sensory change. Negative for headaches.  Psychiatric/Behavioral: Negative.         Objective:   BP 118/80   Pulse 63   Ht '5\' 2"'$  (1.575 m)   Wt 185 lb (83.9 kg)   SpO2 99%   BMI 33.84 kg/m   Vitals:   04/28/22 1305  BP: 118/80   Pulse: 63  Height: '5\' 2"'$  (1.575 m)  Weight: 185 lb (83.9 kg)  SpO2: 99%  BMI (Calculated): 33.83    Physical Exam Vitals and nursing note reviewed.  Constitutional:      Appearance: She is obese.  HENT:     Head: Normocephalic.  Cardiovascular:     Rate and Rhythm: Normal rate and regular rhythm.  Pulmonary:     Effort: Pulmonary effort is normal.     Breath sounds: Normal breath sounds.  Abdominal:     General: Abdomen is flat. Bowel sounds are normal.     Palpations: Abdomen is soft.  Musculoskeletal:     Cervical back: Normal range of motion.  Neurological:     Mental Status: She is alert.     Motor: Weakness (left arm) present.      Results for orders placed or performed in visit on 04/28/22  POCT CBG (Fasting - Glucose)  Result Value Ref Range   Glucose Fasting, POC 217 (A) 70 - 99 mg/dL    Recent Results (from the past 2160 hour(s))  POCT CBG (Fasting - Glucose)     Status: Abnormal   Collection Time: 04/21/22  1:37 PM  Result Value Ref Range   Glucose Fasting, POC 268 (A) 70 - 99 mg/dL  CMP14+EGFR     Status: Abnormal   Collection Time: 04/21/22  2:12 PM  Result Value Ref Range   Glucose 246 (H) 70 - 99 mg/dL   BUN 19 8 - 27 mg/dL   Creatinine, Ser 1.18 (H) 0.57 - 1.00 mg/dL   eGFR 51 (L) >59 mL/min/1.73   BUN/Creatinine Ratio 16 12 - 28   Sodium 138 134 - 144 mmol/L   Potassium 4.5 3.5 - 5.2 mmol/L   Chloride 100 96 - 106 mmol/L   CO2 24 20 - 29 mmol/L   Calcium 9.5 8.7 - 10.3 mg/dL   Total Protein 6.5 6.0 - 8.5 g/dL   Albumin 3.9 3.9 - 4.9 g/dL   Globulin, Total 2.6 1.5 - 4.5 g/dL   Albumin/Globulin Ratio 1.5 1.2 - 2.2   Bilirubin Total 0.3 0.0 - 1.2 mg/dL   Alkaline Phosphatase 113 44 - 121 IU/L   AST 13 0 - 40 IU/L   ALT 12 0 - 32 IU/L  Lipid Panel w/o Chol/HDL Ratio     Status: Abnormal   Collection Time: 04/21/22  2:12 PM  Result Value Ref Range   Cholesterol, Total 237 (H) 100 - 199 mg/dL   Triglycerides 85 0 - 149 mg/dL   HDL 44 >39  mg/dL   VLDL Cholesterol Cal 15 5 - 40 mg/dL   LDL Chol Calc (NIH) 178 (H) 0 - 99 mg/dL  Hemoglobin A1c     Status: Abnormal   Collection Time: 04/21/22  2:12 PM  Result Value Ref Range   Hgb A1c MFr Bld 10.0 (H) 4.8 - 5.6 %    Comment:          Prediabetes: 5.7 - 6.4          Diabetes: >6.4          Glycemic control for adults with diabetes: <7.0    Est. average glucose Bld gHb Est-mCnc 240 mg/dL  Vitamin D (25 hydroxy)     Status: None   Collection Time: 04/21/22  2:12 PM  Result Value Ref Range   Vit D, 25-Hydroxy 34.5 30.0 - 100.0 ng/mL    Comment: Vitamin D deficiency has been defined by the [place] and an Endocrine Society practice guideline as a level of serum 25-OH vitamin D less than 20 ng/mL (1,2). The Endocrine Society went on to further define vitamin D insufficiency as a level between 21 and 29 ng/mL (2). 1. IOM (Institute of Medicine). 2010. Dietary reference    intakes for calcium and D. [place]: The    [employer]. 2. Holick MF, Binkley [place], Bischoff-Ferrari HA, et al.    Evaluation, treatment, and prevention of vitamin D    deficiency: an Endocrine Society clinical practice    guideline. JCEM. 2011 Jul; 96(7):1911-30.   POCT CBG (Fasting - Glucose)     Status: Abnormal   Collection Time: 04/28/22  1:08 PM  Result Value Ref Range   Glucose Fasting, POC 217 (A) 70 - 99 mg/dL      Assessment & Plan:  Strict diet control emphasized.  Will try to set up home health care along with PT and OT.  Prescription sent for Ozempic injections starting at .25 mg/week.  Will see her progress and tolerability in 4 weeks before increasing the dose. Problem List Items Addressed This Visit     Essential hypertension, benign   CAD (coronary artery disease), native coronary artery   Hyperlipidemia   Obesity (BMI 30-39.9)   Relevant Medications   Semaglutide,0.25 or 0.'5MG'$ /DOS, 2 MG/3ML SOPN   Vitamin D deficiency   Type 2 diabetes mellitus with  hyperglycemia, with long-term current use of insulin (HCC) - Primary   Relevant Medications   Semaglutide,0.25 or 0.'5MG'$ /DOS, 2 MG/3ML SOPN   Other Relevant Orders   POCT CBG (Fasting - Glucose) (Completed)    Return in about 4 weeks (around 05/26/2022).   Total time spent: 30 minutes  Perrin Maltese, MD  04/28/2022

## 2022-04-29 ENCOUNTER — Other Ambulatory Visit: Payer: Self-pay

## 2022-04-29 DIAGNOSIS — I10 Essential (primary) hypertension: Secondary | ICD-10-CM

## 2022-04-29 DIAGNOSIS — E782 Mixed hyperlipidemia: Secondary | ICD-10-CM

## 2022-04-29 MED ORDER — TRUE METRIX BLOOD GLUCOSE TEST VI STRP: 1.0000 | ORAL_STRIP | Freq: Three times a day (TID) | 3 refills | Status: DC

## 2022-04-29 MED ORDER — CLOPIDOGREL BISULFATE 75 MG PO TABS: 75.0000 mg | ORAL_TABLET | Freq: Every day | ORAL | 3 refills | Status: DC

## 2022-04-29 MED ORDER — CARVEDILOL 25 MG PO TABS: 25.0000 mg | ORAL_TABLET | Freq: Two times a day (BID) | ORAL | 3 refills | Status: DC

## 2022-04-29 MED ORDER — ROSUVASTATIN CALCIUM 40 MG PO TABS: 40.0000 mg | ORAL_TABLET | Freq: Every day | ORAL | 3 refills | Status: DC

## 2022-04-29 MED ORDER — GABAPENTIN 100 MG PO CAPS: 100.0000 mg | ORAL_CAPSULE | Freq: Every day | ORAL | 3 refills | Status: DC

## 2022-04-29 MED ORDER — TRUE METRIX AIR GLUCOSE METER W/DEVICE KIT: 1.0000 | PACK | Freq: Three times a day (TID) | 0 refills | Status: DC

## 2022-04-29 MED ORDER — AMLODIPINE BESYLATE 5 MG PO TABS: 5.0000 mg | ORAL_TABLET | Freq: Every day | ORAL | 3 refills | Status: DC

## 2022-04-29 MED ORDER — INSULIN LISPRO 100 UNIT/ML IJ SOLN: INTRAMUSCULAR | 3 refills | Status: DC

## 2022-04-29 MED ORDER — FLUOXETINE HCL 20 MG PO CAPS: 20.0000 mg | ORAL_CAPSULE | Freq: Every day | ORAL | 3 refills | Status: DC

## 2022-05-01 ENCOUNTER — Ambulatory Visit: Payer: Medicare Other | Admitting: Physical Therapy

## 2022-05-04 ENCOUNTER — Telehealth: Payer: Self-pay

## 2022-05-04 NOTE — Telephone Encounter (Signed)
Pt called and left vm regarding if referral for physical therapy can be sent to the hospital for her? Please advise

## 2022-05-07 ENCOUNTER — Other Ambulatory Visit: Payer: Self-pay | Admitting: Family

## 2022-05-07 ENCOUNTER — Other Ambulatory Visit: Payer: Self-pay | Admitting: Internal Medicine

## 2022-05-07 DIAGNOSIS — I10 Essential (primary) hypertension: Secondary | ICD-10-CM

## 2022-05-07 DIAGNOSIS — E782 Mixed hyperlipidemia: Secondary | ICD-10-CM

## 2022-05-08 ENCOUNTER — Ambulatory Visit: Payer: Medicare Other | Admitting: Physical Therapy

## 2022-05-15 ENCOUNTER — Ambulatory Visit: Payer: Medicare Other | Admitting: Physical Therapy

## 2022-06-09 ENCOUNTER — Ambulatory Visit: Payer: Medicare HMO | Admitting: Internal Medicine

## 2022-06-12 ENCOUNTER — Ambulatory Visit: Payer: Medicare HMO | Admitting: Internal Medicine

## 2022-06-18 ENCOUNTER — Encounter: Payer: Self-pay | Admitting: Internal Medicine

## 2022-06-18 ENCOUNTER — Ambulatory Visit (INDEPENDENT_AMBULATORY_CARE_PROVIDER_SITE_OTHER): Payer: Medicare HMO | Admitting: Internal Medicine

## 2022-06-18 VITALS — BP 132/78 | HR 74 | Ht 62.0 in | Wt 182.0 lb

## 2022-06-18 DIAGNOSIS — I10 Essential (primary) hypertension: Secondary | ICD-10-CM | POA: Diagnosis not present

## 2022-06-18 DIAGNOSIS — E1165 Type 2 diabetes mellitus with hyperglycemia: Secondary | ICD-10-CM | POA: Diagnosis not present

## 2022-06-18 DIAGNOSIS — G8194 Hemiplegia, unspecified affecting left nondominant side: Secondary | ICD-10-CM | POA: Diagnosis not present

## 2022-06-18 DIAGNOSIS — Z794 Long term (current) use of insulin: Secondary | ICD-10-CM

## 2022-06-18 DIAGNOSIS — E782 Mixed hyperlipidemia: Secondary | ICD-10-CM | POA: Diagnosis not present

## 2022-06-18 DIAGNOSIS — I25110 Atherosclerotic heart disease of native coronary artery with unstable angina pectoris: Secondary | ICD-10-CM

## 2022-06-18 LAB — POCT CBG (FASTING - GLUCOSE)-MANUAL ENTRY: Glucose Fasting, POC: 155 mg/dL — AB (ref 70–99)

## 2022-06-18 MED ORDER — SEMAGLUTIDE(0.25 OR 0.5MG/DOS) 2 MG/3ML ~~LOC~~ SOPN: 0.2500 mg | PEN_INJECTOR | SUBCUTANEOUS | 3 refills | Status: DC

## 2022-06-18 NOTE — Progress Notes (Signed)
Established Patient Office Visit  Subjective:  Patient ID: Danielle Paul, female    DOB: [birth year]  Age: 67 y.o. MRN: [id number]  Chief Complaint  Patient presents with   Follow-up    4 week follow up    Patient comes in for her follow-up today.  Her sugar control has improved with better diet control but she has not received her Ozempic injections yet.  Patient has also not started her PT and OT. Overall she is feeling better.  Will try sending prescription for Ozempic again.   No other concerns at this time.   Past Medical History:  Diagnosis Date   Anxiety    Coronary artery disease    CVA (cerebral vascular accident) ([facility]) 01/2020   Diabetes mellitus without complication ([facility])    Hypercholesteremia    Hypertension    Left-sided weakness    S/P stroke    Past Surgical History:  Procedure Laterality Date   AORTIC VALVE REPLACEMENT (AVR)/CORONARY ARTERY BYPASS GRAFTING (CABG)  2009   CAROTID ANGIOGRAPHY N/A 03/21/2020   Procedure: CAROTID ANGIOGRAPHY;  Surgeon: Annice Needy, MD;  Location: [facility];  Service: Cardiovascular;  Laterality: N/A;    Social History   Socioeconomic History   Marital status: Unknown    Spouse name: Not on file   Number of children: Not on file   Years of education: Not on file   Highest education level: Not on file  Occupational History   Not on file  Tobacco Use   Smoking status: Never   Smokeless tobacco: Never  Vaping Use   Vaping Use: Never used  Substance and Sexual Activity   Alcohol use: Never   Drug use: Never   Sexual activity: Not on file  Other Topics Concern   Not on file  Social History Narrative   Not on file   Social Determinants of Health   Financial Resource Strain: Not on file  Food Insecurity: No Food Insecurity (10/17/2021)   Hunger Vital Sign    Worried About Running Out of Food in the Last Year: Never true    Ran Out of Food in the Last Year: Never true  Transportation Needs: No  Transportation Needs (10/17/2021)   PRAPARE - Administrator, Civil Service (Medical): No    Lack of Transportation (Non-Medical): No  Recent Concern: Transportation Needs - Unmet Transportation Needs (10/17/2021)   PRAPARE - Administrator, Civil Service (Medical): Yes    Lack of Transportation (Non-Medical): Yes  Physical Activity: Not on file  Stress: Not on file  Social Connections: Not on file  Intimate Partner Violence: Not on file    Family History  Problem Relation Age of Onset   Other Mother    Hypertension Mother    Hypertension Father     Allergies  Allergen Reactions   Atorvastatin Other (See Comments)    Muscle Pains   Isosorbide Nitrate Other (See Comments)    Jittery and lightheadedness.   Pravastatin Other (See Comments)   Simvastatin Other (See Comments)    Muscle Pains    Review of Systems  Constitutional:  Negative for chills, fever and weight loss.  HENT:  Negative for ear discharge, hearing loss, sinus pain and tinnitus.   Eyes: Negative.   Respiratory: Negative.    Cardiovascular:  Negative for chest pain, palpitations and leg swelling.  Gastrointestinal: Negative.   Genitourinary: Negative.   Musculoskeletal: Negative.   Neurological:  Positive for focal weakness  and seizures. Negative for sensory change.  Psychiatric/Behavioral:  Negative for depression. The patient is not nervous/anxious.        Objective:   BP 132/78   Pulse 74   Ht  (1.575 m)   Wt 182 lb (82.6 kg)   SpO2 98%   BMI 33.29 kg/m   Vitals:   06/18/22 1004  BP: 132/78  Pulse: 74  Height:  (1.575 m)  Weight: 182 lb (82.6 kg)  SpO2: 98%  BMI (Calculated): 33.28    Physical Exam Vitals and nursing note reviewed.  Constitutional:      Appearance: Normal appearance.  Cardiovascular:     Rate and Rhythm: Normal rate and regular rhythm.     Pulses: Normal pulses.     Heart sounds: Normal heart sounds.  Pulmonary:     Effort:  Pulmonary effort is normal.     Breath sounds: Normal breath sounds.  Abdominal:     General: Bowel sounds are normal.     Palpations: Abdomen is soft.  Musculoskeletal:     Cervical back: Normal range of motion.  Neurological:     Mental Status: She is alert.     Motor: Weakness (Left sided weakness due to stroke.) present.     Gait: Gait abnormal.  Psychiatric:        Mood and Affect: Mood normal.      Results for orders placed or performed in visit on 06/18/22  POCT CBG (Fasting - Glucose)  Result Value Ref Range   Glucose Fasting, POC 155 (A) 70 - 99 mg/dL        Assessment & Plan:  Patient will try getting her Ozempic prescription again.  She will continue all the rest of her medications in the meantime.  Will send referral again for her home health agency as well as the PT and OT for her left hemiparesis as a result of her stroke. Problem List Items Addressed This Visit     Essential hypertension, benign   CAD (coronary artery disease), native coronary artery   Hyperlipidemia   Type 2 diabetes mellitus with hyperglycemia, with long-term current use of insulin - Primary   Relevant Medications   Semaglutide,0.25 or 0.5MG /DOS, 2 MG/3ML SOPN   Other Relevant Orders   POCT CBG (Fasting - Glucose) (Completed)   Left hemiparesis   Relevant Orders   Ambulatory referral to Physical Therapy    Return in about 5 weeks (around 07/23/2022).   Total time spent: 30 minutes  Margaretann Loveless, MD  06/18/2022

## 2022-07-01 ENCOUNTER — Telehealth: Payer: Self-pay | Admitting: Internal Medicine

## 2022-07-01 NOTE — Telephone Encounter (Signed)
Patient left VM and said that Danielle Paul mentioned getting her some assistance in her home and she hasn't heard anything else about it. All I see is a Physical Therapy referral. Please advise.

## 2022-07-03 NOTE — Telephone Encounter (Signed)
Sending orders for [facility] for pt, tried to call pt but unable to [employer] full

## 2022-07-14 DIAGNOSIS — E1122 Type 2 diabetes mellitus with diabetic chronic kidney disease: Secondary | ICD-10-CM | POA: Diagnosis not present

## 2022-07-14 DIAGNOSIS — N189 Chronic kidney disease, unspecified: Secondary | ICD-10-CM | POA: Diagnosis not present

## 2022-07-14 DIAGNOSIS — I2510 Atherosclerotic heart disease of native coronary artery without angina pectoris: Secondary | ICD-10-CM | POA: Diagnosis not present

## 2022-07-14 DIAGNOSIS — F419 Anxiety disorder, unspecified: Secondary | ICD-10-CM | POA: Diagnosis not present

## 2022-07-14 DIAGNOSIS — E782 Mixed hyperlipidemia: Secondary | ICD-10-CM | POA: Diagnosis not present

## 2022-07-14 DIAGNOSIS — E1140 Type 2 diabetes mellitus with diabetic neuropathy, unspecified: Secondary | ICD-10-CM | POA: Diagnosis not present

## 2022-07-14 DIAGNOSIS — E1136 Type 2 diabetes mellitus with diabetic cataract: Secondary | ICD-10-CM | POA: Diagnosis not present

## 2022-07-14 DIAGNOSIS — I129 Hypertensive chronic kidney disease with stage 1 through stage 4 chronic kidney disease, or unspecified chronic kidney disease: Secondary | ICD-10-CM | POA: Diagnosis not present

## 2022-07-14 DIAGNOSIS — I69354 Hemiplegia and hemiparesis following cerebral infarction affecting left non-dominant side: Secondary | ICD-10-CM | POA: Diagnosis not present

## 2022-07-15 ENCOUNTER — Telehealth: Payer: Self-pay | Admitting: Internal Medicine

## 2022-07-15 NOTE — Telephone Encounter (Signed)
Danielle Paul with [facility] left VM needing PT orders for patient and needed an order for a nursing assessment. She also states the patient does not know how to use her Ozempic and is out of spironolactone.   Call [phone] for PT orders. Call [phone] for nursing assessment order.

## 2022-07-16 ENCOUNTER — Telehealth: Payer: Self-pay | Admitting: Internal Medicine

## 2022-07-16 MED ORDER — SPIRONOLACTONE 25 MG PO TABS: 25.0000 mg | ORAL_TABLET | Freq: Every day | ORAL | 3 refills | Status: DC

## 2022-07-16 NOTE — Telephone Encounter (Signed)
Danielle Paul at [facility] called and patient needs orders for nursing for medication education for her insulin and Ozempic. Please advise.  [phone] can leave VM with yes or no

## 2022-07-17 DIAGNOSIS — E1122 Type 2 diabetes mellitus with diabetic chronic kidney disease: Secondary | ICD-10-CM | POA: Diagnosis not present

## 2022-07-17 DIAGNOSIS — E1136 Type 2 diabetes mellitus with diabetic cataract: Secondary | ICD-10-CM | POA: Diagnosis not present

## 2022-07-17 DIAGNOSIS — F419 Anxiety disorder, unspecified: Secondary | ICD-10-CM | POA: Diagnosis not present

## 2022-07-17 DIAGNOSIS — I69354 Hemiplegia and hemiparesis following cerebral infarction affecting left non-dominant side: Secondary | ICD-10-CM | POA: Diagnosis not present

## 2022-07-17 DIAGNOSIS — E782 Mixed hyperlipidemia: Secondary | ICD-10-CM | POA: Diagnosis not present

## 2022-07-17 DIAGNOSIS — N189 Chronic kidney disease, unspecified: Secondary | ICD-10-CM | POA: Diagnosis not present

## 2022-07-17 DIAGNOSIS — E1140 Type 2 diabetes mellitus with diabetic neuropathy, unspecified: Secondary | ICD-10-CM | POA: Diagnosis not present

## 2022-07-17 DIAGNOSIS — I129 Hypertensive chronic kidney disease with stage 1 through stage 4 chronic kidney disease, or unspecified chronic kidney disease: Secondary | ICD-10-CM | POA: Diagnosis not present

## 2022-07-17 DIAGNOSIS — I2510 Atherosclerotic heart disease of native coronary artery without angina pectoris: Secondary | ICD-10-CM | POA: Diagnosis not present

## 2022-07-20 DIAGNOSIS — N189 Chronic kidney disease, unspecified: Secondary | ICD-10-CM | POA: Diagnosis not present

## 2022-07-20 DIAGNOSIS — E782 Mixed hyperlipidemia: Secondary | ICD-10-CM | POA: Diagnosis not present

## 2022-07-20 DIAGNOSIS — E1122 Type 2 diabetes mellitus with diabetic chronic kidney disease: Secondary | ICD-10-CM | POA: Diagnosis not present

## 2022-07-20 DIAGNOSIS — E1140 Type 2 diabetes mellitus with diabetic neuropathy, unspecified: Secondary | ICD-10-CM | POA: Diagnosis not present

## 2022-07-20 DIAGNOSIS — I129 Hypertensive chronic kidney disease with stage 1 through stage 4 chronic kidney disease, or unspecified chronic kidney disease: Secondary | ICD-10-CM | POA: Diagnosis not present

## 2022-07-20 DIAGNOSIS — E1136 Type 2 diabetes mellitus with diabetic cataract: Secondary | ICD-10-CM | POA: Diagnosis not present

## 2022-07-20 DIAGNOSIS — I2510 Atherosclerotic heart disease of native coronary artery without angina pectoris: Secondary | ICD-10-CM | POA: Diagnosis not present

## 2022-07-20 DIAGNOSIS — I69354 Hemiplegia and hemiparesis following cerebral infarction affecting left non-dominant side: Secondary | ICD-10-CM | POA: Diagnosis not present

## 2022-07-20 DIAGNOSIS — F419 Anxiety disorder, unspecified: Secondary | ICD-10-CM | POA: Diagnosis not present

## 2022-07-21 DIAGNOSIS — I129 Hypertensive chronic kidney disease with stage 1 through stage 4 chronic kidney disease, or unspecified chronic kidney disease: Secondary | ICD-10-CM | POA: Diagnosis not present

## 2022-07-21 DIAGNOSIS — I2510 Atherosclerotic heart disease of native coronary artery without angina pectoris: Secondary | ICD-10-CM | POA: Diagnosis not present

## 2022-07-21 DIAGNOSIS — I69354 Hemiplegia and hemiparesis following cerebral infarction affecting left non-dominant side: Secondary | ICD-10-CM | POA: Diagnosis not present

## 2022-07-21 DIAGNOSIS — E1136 Type 2 diabetes mellitus with diabetic cataract: Secondary | ICD-10-CM | POA: Diagnosis not present

## 2022-07-21 DIAGNOSIS — E1140 Type 2 diabetes mellitus with diabetic neuropathy, unspecified: Secondary | ICD-10-CM | POA: Diagnosis not present

## 2022-07-21 DIAGNOSIS — F419 Anxiety disorder, unspecified: Secondary | ICD-10-CM | POA: Diagnosis not present

## 2022-07-21 DIAGNOSIS — E1122 Type 2 diabetes mellitus with diabetic chronic kidney disease: Secondary | ICD-10-CM | POA: Diagnosis not present

## 2022-07-21 DIAGNOSIS — N189 Chronic kidney disease, unspecified: Secondary | ICD-10-CM | POA: Diagnosis not present

## 2022-07-21 DIAGNOSIS — E782 Mixed hyperlipidemia: Secondary | ICD-10-CM | POA: Diagnosis not present

## 2022-07-22 ENCOUNTER — Telehealth: Payer: Self-pay

## 2022-07-22 DIAGNOSIS — E1122 Type 2 diabetes mellitus with diabetic chronic kidney disease: Secondary | ICD-10-CM | POA: Diagnosis not present

## 2022-07-22 DIAGNOSIS — I2510 Atherosclerotic heart disease of native coronary artery without angina pectoris: Secondary | ICD-10-CM | POA: Diagnosis not present

## 2022-07-22 DIAGNOSIS — E1140 Type 2 diabetes mellitus with diabetic neuropathy, unspecified: Secondary | ICD-10-CM | POA: Diagnosis not present

## 2022-07-22 DIAGNOSIS — I129 Hypertensive chronic kidney disease with stage 1 through stage 4 chronic kidney disease, or unspecified chronic kidney disease: Secondary | ICD-10-CM | POA: Diagnosis not present

## 2022-07-22 DIAGNOSIS — I69354 Hemiplegia and hemiparesis following cerebral infarction affecting left non-dominant side: Secondary | ICD-10-CM | POA: Diagnosis not present

## 2022-07-22 DIAGNOSIS — E1136 Type 2 diabetes mellitus with diabetic cataract: Secondary | ICD-10-CM | POA: Diagnosis not present

## 2022-07-22 DIAGNOSIS — E782 Mixed hyperlipidemia: Secondary | ICD-10-CM | POA: Diagnosis not present

## 2022-07-22 DIAGNOSIS — F419 Anxiety disorder, unspecified: Secondary | ICD-10-CM | POA: Diagnosis not present

## 2022-07-22 DIAGNOSIS — N189 Chronic kidney disease, unspecified: Secondary | ICD-10-CM | POA: Diagnosis not present

## 2022-07-22 NOTE — Telephone Encounter (Signed)
Patient Danielle Paul askling for call back

## 2022-07-23 ENCOUNTER — Telehealth: Payer: Self-pay

## 2022-07-23 ENCOUNTER — Ambulatory Visit (INDEPENDENT_AMBULATORY_CARE_PROVIDER_SITE_OTHER): Payer: Medicare HMO | Admitting: Internal Medicine

## 2022-07-23 ENCOUNTER — Other Ambulatory Visit: Payer: Self-pay

## 2022-07-23 ENCOUNTER — Encounter: Payer: Self-pay | Admitting: Internal Medicine

## 2022-07-23 VITALS — BP 118/74 | HR 63 | Ht 62.0 in | Wt 182.0 lb

## 2022-07-23 DIAGNOSIS — E559 Vitamin D deficiency, unspecified: Secondary | ICD-10-CM | POA: Diagnosis not present

## 2022-07-23 DIAGNOSIS — I10 Essential (primary) hypertension: Secondary | ICD-10-CM

## 2022-07-23 DIAGNOSIS — E1165 Type 2 diabetes mellitus with hyperglycemia: Secondary | ICD-10-CM | POA: Diagnosis not present

## 2022-07-23 DIAGNOSIS — Z794 Long term (current) use of insulin: Secondary | ICD-10-CM | POA: Diagnosis not present

## 2022-07-23 DIAGNOSIS — E782 Mixed hyperlipidemia: Secondary | ICD-10-CM | POA: Diagnosis not present

## 2022-07-23 DIAGNOSIS — I25718 Atherosclerosis of autologous vein coronary artery bypass graft(s) with other forms of angina pectoris: Secondary | ICD-10-CM | POA: Diagnosis not present

## 2022-07-23 LAB — POCT CBG (FASTING - GLUCOSE)-MANUAL ENTRY: Glucose Fasting, POC: 124 mg/dL — AB (ref 70–99)

## 2022-07-23 MED ORDER — VITAMIN C 100 MG PO TABS: 100.0000 mg | ORAL_TABLET | Freq: Every day | ORAL | 3 refills | Status: DC

## 2022-07-23 MED ORDER — DAPAGLIFLOZIN PROPANEDIOL 10 MG PO TABS: 10.0000 mg | ORAL_TABLET | Freq: Every day | ORAL | 3 refills | Status: DC

## 2022-07-23 MED ORDER — SPIRONOLACTONE 25 MG PO TABS: 25.0000 mg | ORAL_TABLET | Freq: Every day | ORAL | 3 refills | Status: DC

## 2022-07-23 MED ORDER — CHOLECALCIFEROL 1.25 MG (50000 UT) PO CAPS: 50000.0000 [IU] | ORAL_CAPSULE | ORAL | 3 refills | Status: DC

## 2022-07-23 MED ORDER — FREESTYLE LIBRE 2 READER DEVI: 0 refills | Status: DC

## 2022-07-23 NOTE — Telephone Encounter (Signed)
Pt received freestyle libre 2 sensors but no reader, asked if we can send rx for reader to [facility] mail order pharmacy please advise

## 2022-07-23 NOTE — Progress Notes (Signed)
Established Patient Office Visit  Subjective:  Patient ID: Danielle Paul, female    DOB: 30-Nov-1955  Age: 67 y.o. MRN: [id number]  Chief Complaint  Patient presents with   Follow-up    5 week follow up    Patient comes in for her follow-up today.  She has started her physical therapy and says she is feeling better now.  She has more energy.  Patient also has a prescription for Ozempic but did not start it yet.  She is advised to start at 0.25 mg once a week for a month.  She will return in 1 month and we will discuss further increasing the dose. Patient is also due for her labs today.  She does not have any new complaints.    No other concerns at this time.   Past Medical History:  Diagnosis Date   Anxiety    Coronary artery disease    CVA (cerebral vascular accident) ([facility]) 01/2020   Diabetes mellitus without complication ([facility])    Hypercholesteremia    Hypertension    Left-sided weakness    S/P stroke    Past Surgical History:  Procedure Laterality Date   AORTIC VALVE REPLACEMENT (AVR)/CORONARY ARTERY BYPASS GRAFTING (CABG)  2009   CAROTID ANGIOGRAPHY N/A 03/21/2020   Procedure: CAROTID ANGIOGRAPHY;  Surgeon: Annice Needy, MD;  Location: [facility];  Service: Cardiovascular;  Laterality: N/A;    Social History   Socioeconomic History   Marital status: Unknown    Spouse name: Not on file   Number of children: Not on file   Years of education: Not on file   Highest education level: Not on file  Occupational History   Not on file  Tobacco Use   Smoking status: Never   Smokeless tobacco: Never  Vaping Use   Vaping Use: Never used  Substance and Sexual Activity   Alcohol use: Never   Drug use: Never   Sexual activity: Not on file  Other Topics Concern   Not on file  Social History Narrative   Not on file   Social Determinants of Health   Financial Resource Strain: Not on file  Food Insecurity: No Food Insecurity (10/17/2021)   Hunger Vital Sign     Worried About Running Out of Food in the Last Year: Never true    Ran Out of Food in the Last Year: Never true  Transportation Needs: No Transportation Needs (10/17/2021)   PRAPARE - Administrator, Civil Service (Medical): No    Lack of Transportation (Non-Medical): No  Recent Concern: Transportation Needs - Unmet Transportation Needs (10/17/2021)   PRAPARE - Administrator, Civil Service (Medical): Yes    Lack of Transportation (Non-Medical): Yes  Physical Activity: Not on file  Stress: Not on file  Social Connections: Not on file  Intimate Partner Violence: Not on file    Family History  Problem Relation Age of Onset   Other Mother    Hypertension Mother    Hypertension Father     Allergies  Allergen Reactions   Atorvastatin Other (See Comments)    Muscle Pains   Isosorbide Nitrate Other (See Comments)    Jittery and lightheadedness.   Pravastatin Other (See Comments)   Simvastatin Other (See Comments)    Muscle Pains    Review of Systems  Constitutional:  Negative for chills, diaphoresis, fever and weight loss.  HENT:  Negative for congestion, ear discharge, ear pain, hearing loss, sore throat  and tinnitus.   Eyes: Negative.   Respiratory:  Negative for cough, shortness of breath, wheezing and stridor.   Cardiovascular:  Negative for chest pain, palpitations, orthopnea and leg swelling.  Gastrointestinal:  Negative for abdominal pain, blood in stool, heartburn, melena, nausea and vomiting.  Genitourinary: Negative.   Musculoskeletal:  Negative for falls, joint pain, myalgias and neck pain.  Skin: Negative.   Neurological:  Positive for weakness (Left  hemiparesis). Negative for dizziness, tingling, sensory change, speech change, focal weakness and headaches.  Psychiatric/Behavioral:  Negative for depression and memory loss. The patient is not nervous/anxious and does not have insomnia.        Objective:   BP 118/74   Pulse 63   Ht 5\' 2"   (1.575 m)   Wt 182 lb (82.6 kg)   SpO2 99%   BMI 33.29 kg/m   Vitals:   07/23/22 1300  BP: 118/74  Pulse: 63  Height: 5\' 2"  (1.575 m)  Weight: 182 lb (82.6 kg)  SpO2: 99%  BMI (Calculated): 33.28    Physical Exam Vitals and nursing note reviewed.  Constitutional:      Appearance: Normal appearance.  HENT:     Right Ear: Tympanic membrane normal.     Left Ear: Tympanic membrane normal.  Cardiovascular:     Rate and Rhythm: Normal rate and regular rhythm.     Pulses: Normal pulses.     Heart sounds: Normal heart sounds.  Pulmonary:     Effort: Pulmonary effort is normal.     Breath sounds: Rhonchi present. No wheezing or rales.  Abdominal:     General: Bowel sounds are normal.     Palpations: Abdomen is soft.  Musculoskeletal:     Cervical back: Normal range of motion and neck supple.     Right lower leg: No edema.     Left lower leg: No edema.  Neurological:     Mental Status: She is alert.      Results for orders placed or performed in visit on 07/23/22  POCT CBG (Fasting - Glucose)  Result Value Ref Range   Glucose Fasting, POC 124 (A) 70 - 99 mg/dL    Recent Results (from the past 2160 hour(s))  POCT CBG (Fasting - Glucose)     Status: Abnormal   Collection Time: 04/28/22  1:08 PM  Result Value Ref Range   Glucose Fasting, POC 217 (A) 70 - 99 mg/dL  POCT CBG (Fasting - Glucose)     Status: Abnormal   Collection Time: 06/18/22 10:09 AM  Result Value Ref Range   Glucose Fasting, POC 155 (A) 70 - 99 mg/dL  POCT CBG (Fasting - Glucose)     Status: Abnormal   Collection Time: 07/23/22  1:06 PM  Result Value Ref Range   Glucose Fasting, POC 124 (A) 70 - 99 mg/dL      Assessment & Plan:  Patient advised to continue taking all her medications.  She will start Ozempic injections 0.25 subcu per week.  Check labs today Problem List Items Addressed This Visit     Essential hypertension, benign   Relevant Medications   spironolactone (ALDACTONE) 25 MG  tablet   Other Relevant Orders   CMP14+EGFR   Hyperlipidemia   Relevant Medications   spironolactone (ALDACTONE) 25 MG tablet   Other Relevant Orders   Lipid Panel w/o Chol/HDL Ratio   Vitamin D deficiency   Relevant Medications   Cholecalciferol 1.25 MG (50000 UT) capsule   Coronary artery  disease of autologous vein bypass graft with stable angina pectoris (HCC)   Relevant Medications   Ascorbic Acid (VITAMIN C) 100 MG tablet   spironolactone (ALDACTONE) 25 MG tablet   Other Relevant Orders   CBC With Differential   Type 2 diabetes mellitus with hyperglycemia, with long-term current use of insulin (HCC) - Primary   Relevant Medications   Ascorbic Acid (VITAMIN C) 100 MG tablet   dapagliflozin propanediol (FARXIGA) 10 MG TABS tablet   Other Relevant Orders   POCT CBG (Fasting - Glucose) (Completed)   Hemoglobin A1c    Return in about 1 month (around 08/23/2022).   Total time spent: 30 minutes  Margaretann Loveless, MD  07/23/2022   This document may have been prepared by [facility] Voice Recognition software and as such may include unintentional dictation errors.

## 2022-07-24 DIAGNOSIS — I2510 Atherosclerotic heart disease of native coronary artery without angina pectoris: Secondary | ICD-10-CM | POA: Diagnosis not present

## 2022-07-24 DIAGNOSIS — E1136 Type 2 diabetes mellitus with diabetic cataract: Secondary | ICD-10-CM | POA: Diagnosis not present

## 2022-07-24 DIAGNOSIS — I69354 Hemiplegia and hemiparesis following cerebral infarction affecting left non-dominant side: Secondary | ICD-10-CM | POA: Diagnosis not present

## 2022-07-24 DIAGNOSIS — N189 Chronic kidney disease, unspecified: Secondary | ICD-10-CM | POA: Diagnosis not present

## 2022-07-24 DIAGNOSIS — E1140 Type 2 diabetes mellitus with diabetic neuropathy, unspecified: Secondary | ICD-10-CM | POA: Diagnosis not present

## 2022-07-24 DIAGNOSIS — E1122 Type 2 diabetes mellitus with diabetic chronic kidney disease: Secondary | ICD-10-CM | POA: Diagnosis not present

## 2022-07-24 DIAGNOSIS — E782 Mixed hyperlipidemia: Secondary | ICD-10-CM | POA: Diagnosis not present

## 2022-07-24 DIAGNOSIS — F419 Anxiety disorder, unspecified: Secondary | ICD-10-CM | POA: Diagnosis not present

## 2022-07-24 DIAGNOSIS — I129 Hypertensive chronic kidney disease with stage 1 through stage 4 chronic kidney disease, or unspecified chronic kidney disease: Secondary | ICD-10-CM | POA: Diagnosis not present

## 2022-07-24 LAB — CBC WITH DIFFERENTIAL
Basophils Absolute: 0 10*3/uL (ref 0.0–0.2)
Basos: 1 %
EOS (ABSOLUTE): 0.1 10*3/uL (ref 0.0–0.4)
Eos: 2 %
Hematocrit: 42.6 % (ref 34.0–46.6)
Hemoglobin: 13.6 g/dL (ref 11.1–15.9)
Immature Grans (Abs): 0.1 10*3/uL (ref 0.0–0.1)
Immature Granulocytes: 1 %
Lymphocytes Absolute: 1.8 10*3/uL (ref 0.7–3.1)
Lymphs: 32 %
MCH: 25.8 pg — ABNORMAL LOW (ref 26.6–33.0)
MCHC: 31.9 g/dL (ref 31.5–35.7)
MCV: 81 fL (ref 79–97)
Monocytes Absolute: 0.4 10*3/uL (ref 0.1–0.9)
Monocytes: 8 %
Neutrophils Absolute: 3.2 10*3/uL (ref 1.4–7.0)
Neutrophils: 56 %
RBC: 5.27 x10E6/uL (ref 3.77–5.28)
RDW: 14.4 % (ref 11.7–15.4)
WBC: 5.6 10*3/uL (ref 3.4–10.8)

## 2022-07-24 LAB — CMP14+EGFR
ALT: 11 IU/L (ref 0–32)
AST: 19 IU/L (ref 0–40)
Albumin/Globulin Ratio: 1.5 (ref 1.2–2.2)
Albumin: 4.2 g/dL (ref 3.9–4.9)
Alkaline Phosphatase: 110 IU/L (ref 44–121)
BUN/Creatinine Ratio: 15 (ref 12–28)
BUN: 18 mg/dL (ref 8–27)
Bilirubin Total: 0.3 mg/dL (ref 0.0–1.2)
CO2: 25 mmol/L (ref 20–29)
Calcium: 9.6 mg/dL (ref 8.7–10.3)
Chloride: 101 mmol/L (ref 96–106)
Creatinine, Ser: 1.22 mg/dL — ABNORMAL HIGH (ref 0.57–1.00)
Globulin, Total: 2.8 g/dL (ref 1.5–4.5)
Glucose: 126 mg/dL — ABNORMAL HIGH (ref 70–99)
Potassium: 4.4 mmol/L (ref 3.5–5.2)
Sodium: 139 mmol/L (ref 134–144)
Total Protein: 7 g/dL (ref 6.0–8.5)
eGFR: 49 mL/min/{1.73_m2} — ABNORMAL LOW (ref >59)

## 2022-07-24 LAB — LIPID PANEL W/O CHOL/HDL RATIO
Cholesterol, Total: 213 mg/dL — ABNORMAL HIGH (ref 100–199)
HDL: 48 mg/dL (ref >39)
LDL Chol Calc (NIH): 150 mg/dL — ABNORMAL HIGH (ref 0–99)
Triglycerides: 83 mg/dL (ref 0–149)
VLDL Cholesterol Cal: 15 mg/dL (ref 5–40)

## 2022-07-24 LAB — HEMOGLOBIN A1C
Est. average glucose Bld gHb Est-mCnc: 252 mg/dL
Hgb A1c MFr Bld: 10.4 % — ABNORMAL HIGH (ref 4.8–5.6)

## 2022-07-27 ENCOUNTER — Telehealth: Payer: Self-pay

## 2022-07-27 ENCOUNTER — Other Ambulatory Visit: Payer: Self-pay

## 2022-07-27 ENCOUNTER — Ambulatory Visit: Payer: Medicare Other

## 2022-07-27 DIAGNOSIS — G8194 Hemiplegia, unspecified affecting left nondominant side: Secondary | ICD-10-CM

## 2022-07-27 NOTE — Telephone Encounter (Signed)
Connie OT with [facility] called and left vm regarding pt, requesting verbals for OT 1w5 & verbals for [facility] to assist with bathing for 1w6.   She also mentioned if we can send order for a left hand splint to [facility] for pt, their fax number is: [phone]. Please advise  Connie's call back: [phone]

## 2022-07-28 DIAGNOSIS — I129 Hypertensive chronic kidney disease with stage 1 through stage 4 chronic kidney disease, or unspecified chronic kidney disease: Secondary | ICD-10-CM | POA: Diagnosis not present

## 2022-07-28 DIAGNOSIS — I2510 Atherosclerotic heart disease of native coronary artery without angina pectoris: Secondary | ICD-10-CM | POA: Diagnosis not present

## 2022-07-28 DIAGNOSIS — E1136 Type 2 diabetes mellitus with diabetic cataract: Secondary | ICD-10-CM | POA: Diagnosis not present

## 2022-07-28 DIAGNOSIS — F419 Anxiety disorder, unspecified: Secondary | ICD-10-CM | POA: Diagnosis not present

## 2022-07-28 DIAGNOSIS — E782 Mixed hyperlipidemia: Secondary | ICD-10-CM | POA: Diagnosis not present

## 2022-07-28 DIAGNOSIS — E1122 Type 2 diabetes mellitus with diabetic chronic kidney disease: Secondary | ICD-10-CM | POA: Diagnosis not present

## 2022-07-28 DIAGNOSIS — N189 Chronic kidney disease, unspecified: Secondary | ICD-10-CM | POA: Diagnosis not present

## 2022-07-28 DIAGNOSIS — E1140 Type 2 diabetes mellitus with diabetic neuropathy, unspecified: Secondary | ICD-10-CM | POA: Diagnosis not present

## 2022-07-28 DIAGNOSIS — I69354 Hemiplegia and hemiparesis following cerebral infarction affecting left non-dominant side: Secondary | ICD-10-CM | POA: Diagnosis not present

## 2022-07-30 DIAGNOSIS — E1122 Type 2 diabetes mellitus with diabetic chronic kidney disease: Secondary | ICD-10-CM | POA: Diagnosis not present

## 2022-07-30 DIAGNOSIS — E782 Mixed hyperlipidemia: Secondary | ICD-10-CM | POA: Diagnosis not present

## 2022-07-30 DIAGNOSIS — N189 Chronic kidney disease, unspecified: Secondary | ICD-10-CM | POA: Diagnosis not present

## 2022-07-30 DIAGNOSIS — I129 Hypertensive chronic kidney disease with stage 1 through stage 4 chronic kidney disease, or unspecified chronic kidney disease: Secondary | ICD-10-CM | POA: Diagnosis not present

## 2022-07-30 DIAGNOSIS — E1136 Type 2 diabetes mellitus with diabetic cataract: Secondary | ICD-10-CM | POA: Diagnosis not present

## 2022-07-30 DIAGNOSIS — E1140 Type 2 diabetes mellitus with diabetic neuropathy, unspecified: Secondary | ICD-10-CM | POA: Diagnosis not present

## 2022-07-30 DIAGNOSIS — I69354 Hemiplegia and hemiparesis following cerebral infarction affecting left non-dominant side: Secondary | ICD-10-CM | POA: Diagnosis not present

## 2022-07-30 DIAGNOSIS — F419 Anxiety disorder, unspecified: Secondary | ICD-10-CM | POA: Diagnosis not present

## 2022-07-30 DIAGNOSIS — I2510 Atherosclerotic heart disease of native coronary artery without angina pectoris: Secondary | ICD-10-CM | POA: Diagnosis not present

## 2022-07-30 NOTE — Progress Notes (Signed)
Unable to lm

## 2022-07-31 DIAGNOSIS — F419 Anxiety disorder, unspecified: Secondary | ICD-10-CM | POA: Diagnosis not present

## 2022-07-31 DIAGNOSIS — E1136 Type 2 diabetes mellitus with diabetic cataract: Secondary | ICD-10-CM | POA: Diagnosis not present

## 2022-07-31 DIAGNOSIS — E1122 Type 2 diabetes mellitus with diabetic chronic kidney disease: Secondary | ICD-10-CM | POA: Diagnosis not present

## 2022-07-31 DIAGNOSIS — E1140 Type 2 diabetes mellitus with diabetic neuropathy, unspecified: Secondary | ICD-10-CM | POA: Diagnosis not present

## 2022-07-31 DIAGNOSIS — I129 Hypertensive chronic kidney disease with stage 1 through stage 4 chronic kidney disease, or unspecified chronic kidney disease: Secondary | ICD-10-CM | POA: Diagnosis not present

## 2022-07-31 DIAGNOSIS — I69354 Hemiplegia and hemiparesis following cerebral infarction affecting left non-dominant side: Secondary | ICD-10-CM | POA: Diagnosis not present

## 2022-07-31 DIAGNOSIS — N189 Chronic kidney disease, unspecified: Secondary | ICD-10-CM | POA: Diagnosis not present

## 2022-07-31 DIAGNOSIS — I2510 Atherosclerotic heart disease of native coronary artery without angina pectoris: Secondary | ICD-10-CM | POA: Diagnosis not present

## 2022-07-31 DIAGNOSIS — E782 Mixed hyperlipidemia: Secondary | ICD-10-CM | POA: Diagnosis not present

## 2022-08-04 DIAGNOSIS — E782 Mixed hyperlipidemia: Secondary | ICD-10-CM | POA: Diagnosis not present

## 2022-08-04 DIAGNOSIS — F419 Anxiety disorder, unspecified: Secondary | ICD-10-CM | POA: Diagnosis not present

## 2022-08-04 DIAGNOSIS — E1122 Type 2 diabetes mellitus with diabetic chronic kidney disease: Secondary | ICD-10-CM | POA: Diagnosis not present

## 2022-08-04 DIAGNOSIS — E1140 Type 2 diabetes mellitus with diabetic neuropathy, unspecified: Secondary | ICD-10-CM | POA: Diagnosis not present

## 2022-08-04 DIAGNOSIS — I129 Hypertensive chronic kidney disease with stage 1 through stage 4 chronic kidney disease, or unspecified chronic kidney disease: Secondary | ICD-10-CM | POA: Diagnosis not present

## 2022-08-04 DIAGNOSIS — I2510 Atherosclerotic heart disease of native coronary artery without angina pectoris: Secondary | ICD-10-CM | POA: Diagnosis not present

## 2022-08-04 DIAGNOSIS — I69354 Hemiplegia and hemiparesis following cerebral infarction affecting left non-dominant side: Secondary | ICD-10-CM | POA: Diagnosis not present

## 2022-08-04 DIAGNOSIS — E1136 Type 2 diabetes mellitus with diabetic cataract: Secondary | ICD-10-CM | POA: Diagnosis not present

## 2022-08-04 DIAGNOSIS — N189 Chronic kidney disease, unspecified: Secondary | ICD-10-CM | POA: Diagnosis not present

## 2022-08-05 ENCOUNTER — Other Ambulatory Visit: Payer: Self-pay | Admitting: Family

## 2022-08-05 DIAGNOSIS — E1140 Type 2 diabetes mellitus with diabetic neuropathy, unspecified: Secondary | ICD-10-CM | POA: Diagnosis not present

## 2022-08-05 DIAGNOSIS — F419 Anxiety disorder, unspecified: Secondary | ICD-10-CM | POA: Diagnosis not present

## 2022-08-05 DIAGNOSIS — E1136 Type 2 diabetes mellitus with diabetic cataract: Secondary | ICD-10-CM | POA: Diagnosis not present

## 2022-08-05 DIAGNOSIS — E782 Mixed hyperlipidemia: Secondary | ICD-10-CM | POA: Diagnosis not present

## 2022-08-05 DIAGNOSIS — I129 Hypertensive chronic kidney disease with stage 1 through stage 4 chronic kidney disease, or unspecified chronic kidney disease: Secondary | ICD-10-CM | POA: Diagnosis not present

## 2022-08-05 DIAGNOSIS — N189 Chronic kidney disease, unspecified: Secondary | ICD-10-CM | POA: Diagnosis not present

## 2022-08-05 DIAGNOSIS — E1122 Type 2 diabetes mellitus with diabetic chronic kidney disease: Secondary | ICD-10-CM | POA: Diagnosis not present

## 2022-08-05 DIAGNOSIS — I69354 Hemiplegia and hemiparesis following cerebral infarction affecting left non-dominant side: Secondary | ICD-10-CM | POA: Diagnosis not present

## 2022-08-05 DIAGNOSIS — I2510 Atherosclerotic heart disease of native coronary artery without angina pectoris: Secondary | ICD-10-CM | POA: Diagnosis not present

## 2022-08-06 DIAGNOSIS — E1140 Type 2 diabetes mellitus with diabetic neuropathy, unspecified: Secondary | ICD-10-CM | POA: Diagnosis not present

## 2022-08-06 DIAGNOSIS — E1122 Type 2 diabetes mellitus with diabetic chronic kidney disease: Secondary | ICD-10-CM | POA: Diagnosis not present

## 2022-08-06 DIAGNOSIS — N189 Chronic kidney disease, unspecified: Secondary | ICD-10-CM | POA: Diagnosis not present

## 2022-08-06 DIAGNOSIS — I129 Hypertensive chronic kidney disease with stage 1 through stage 4 chronic kidney disease, or unspecified chronic kidney disease: Secondary | ICD-10-CM | POA: Diagnosis not present

## 2022-08-06 DIAGNOSIS — F419 Anxiety disorder, unspecified: Secondary | ICD-10-CM | POA: Diagnosis not present

## 2022-08-06 DIAGNOSIS — E782 Mixed hyperlipidemia: Secondary | ICD-10-CM | POA: Diagnosis not present

## 2022-08-06 DIAGNOSIS — I2510 Atherosclerotic heart disease of native coronary artery without angina pectoris: Secondary | ICD-10-CM | POA: Diagnosis not present

## 2022-08-06 DIAGNOSIS — I69354 Hemiplegia and hemiparesis following cerebral infarction affecting left non-dominant side: Secondary | ICD-10-CM | POA: Diagnosis not present

## 2022-08-06 DIAGNOSIS — E1136 Type 2 diabetes mellitus with diabetic cataract: Secondary | ICD-10-CM | POA: Diagnosis not present

## 2022-08-07 ENCOUNTER — Telehealth: Payer: Self-pay | Admitting: Internal Medicine

## 2022-08-07 DIAGNOSIS — F419 Anxiety disorder, unspecified: Secondary | ICD-10-CM | POA: Diagnosis not present

## 2022-08-07 DIAGNOSIS — E782 Mixed hyperlipidemia: Secondary | ICD-10-CM | POA: Diagnosis not present

## 2022-08-07 DIAGNOSIS — I129 Hypertensive chronic kidney disease with stage 1 through stage 4 chronic kidney disease, or unspecified chronic kidney disease: Secondary | ICD-10-CM | POA: Diagnosis not present

## 2022-08-07 DIAGNOSIS — E1140 Type 2 diabetes mellitus with diabetic neuropathy, unspecified: Secondary | ICD-10-CM | POA: Diagnosis not present

## 2022-08-07 DIAGNOSIS — I69354 Hemiplegia and hemiparesis following cerebral infarction affecting left non-dominant side: Secondary | ICD-10-CM | POA: Diagnosis not present

## 2022-08-07 DIAGNOSIS — I2510 Atherosclerotic heart disease of native coronary artery without angina pectoris: Secondary | ICD-10-CM | POA: Diagnosis not present

## 2022-08-07 DIAGNOSIS — E1136 Type 2 diabetes mellitus with diabetic cataract: Secondary | ICD-10-CM | POA: Diagnosis not present

## 2022-08-07 DIAGNOSIS — E1122 Type 2 diabetes mellitus with diabetic chronic kidney disease: Secondary | ICD-10-CM | POA: Diagnosis not present

## 2022-08-07 DIAGNOSIS — N189 Chronic kidney disease, unspecified: Secondary | ICD-10-CM | POA: Diagnosis not present

## 2022-08-07 NOTE — Telephone Encounter (Signed)
Juan left VM saying that the patient has an appt for her hand brace but was wondering if they could get an order for a foot brace as well? Please advise.  Callback # [phone]

## 2022-08-12 ENCOUNTER — Telehealth: Payer: Self-pay

## 2022-08-12 DIAGNOSIS — I2510 Atherosclerotic heart disease of native coronary artery without angina pectoris: Secondary | ICD-10-CM | POA: Diagnosis not present

## 2022-08-12 DIAGNOSIS — I129 Hypertensive chronic kidney disease with stage 1 through stage 4 chronic kidney disease, or unspecified chronic kidney disease: Secondary | ICD-10-CM | POA: Diagnosis not present

## 2022-08-12 DIAGNOSIS — I69354 Hemiplegia and hemiparesis following cerebral infarction affecting left non-dominant side: Secondary | ICD-10-CM | POA: Diagnosis not present

## 2022-08-12 DIAGNOSIS — E1136 Type 2 diabetes mellitus with diabetic cataract: Secondary | ICD-10-CM | POA: Diagnosis not present

## 2022-08-12 DIAGNOSIS — E782 Mixed hyperlipidemia: Secondary | ICD-10-CM | POA: Diagnosis not present

## 2022-08-12 DIAGNOSIS — N189 Chronic kidney disease, unspecified: Secondary | ICD-10-CM | POA: Diagnosis not present

## 2022-08-12 DIAGNOSIS — E1140 Type 2 diabetes mellitus with diabetic neuropathy, unspecified: Secondary | ICD-10-CM | POA: Diagnosis not present

## 2022-08-12 DIAGNOSIS — E1122 Type 2 diabetes mellitus with diabetic chronic kidney disease: Secondary | ICD-10-CM | POA: Diagnosis not present

## 2022-08-12 DIAGNOSIS — F419 Anxiety disorder, unspecified: Secondary | ICD-10-CM | POA: Diagnosis not present

## 2022-08-12 NOTE — Telephone Encounter (Signed)
PT with [facility] called asking for call back about L ASO for L foot drop, please call her for more information   [phone]

## 2022-08-13 DIAGNOSIS — I2510 Atherosclerotic heart disease of native coronary artery without angina pectoris: Secondary | ICD-10-CM | POA: Diagnosis not present

## 2022-08-13 DIAGNOSIS — N189 Chronic kidney disease, unspecified: Secondary | ICD-10-CM | POA: Diagnosis not present

## 2022-08-13 DIAGNOSIS — E1136 Type 2 diabetes mellitus with diabetic cataract: Secondary | ICD-10-CM | POA: Diagnosis not present

## 2022-08-13 DIAGNOSIS — I129 Hypertensive chronic kidney disease with stage 1 through stage 4 chronic kidney disease, or unspecified chronic kidney disease: Secondary | ICD-10-CM | POA: Diagnosis not present

## 2022-08-13 DIAGNOSIS — F419 Anxiety disorder, unspecified: Secondary | ICD-10-CM | POA: Diagnosis not present

## 2022-08-13 DIAGNOSIS — E1140 Type 2 diabetes mellitus with diabetic neuropathy, unspecified: Secondary | ICD-10-CM | POA: Diagnosis not present

## 2022-08-13 DIAGNOSIS — E782 Mixed hyperlipidemia: Secondary | ICD-10-CM | POA: Diagnosis not present

## 2022-08-13 DIAGNOSIS — I69354 Hemiplegia and hemiparesis following cerebral infarction affecting left non-dominant side: Secondary | ICD-10-CM | POA: Diagnosis not present

## 2022-08-13 DIAGNOSIS — E1122 Type 2 diabetes mellitus with diabetic chronic kidney disease: Secondary | ICD-10-CM | POA: Diagnosis not present

## 2022-08-17 ENCOUNTER — Telehealth: Payer: Self-pay

## 2022-08-17 DIAGNOSIS — I2510 Atherosclerotic heart disease of native coronary artery without angina pectoris: Secondary | ICD-10-CM | POA: Diagnosis not present

## 2022-08-17 DIAGNOSIS — N189 Chronic kidney disease, unspecified: Secondary | ICD-10-CM | POA: Diagnosis not present

## 2022-08-17 DIAGNOSIS — E782 Mixed hyperlipidemia: Secondary | ICD-10-CM | POA: Diagnosis not present

## 2022-08-17 DIAGNOSIS — F419 Anxiety disorder, unspecified: Secondary | ICD-10-CM | POA: Diagnosis not present

## 2022-08-17 DIAGNOSIS — I69354 Hemiplegia and hemiparesis following cerebral infarction affecting left non-dominant side: Secondary | ICD-10-CM | POA: Diagnosis not present

## 2022-08-17 DIAGNOSIS — E1136 Type 2 diabetes mellitus with diabetic cataract: Secondary | ICD-10-CM | POA: Diagnosis not present

## 2022-08-17 DIAGNOSIS — I129 Hypertensive chronic kidney disease with stage 1 through stage 4 chronic kidney disease, or unspecified chronic kidney disease: Secondary | ICD-10-CM | POA: Diagnosis not present

## 2022-08-17 DIAGNOSIS — E1140 Type 2 diabetes mellitus with diabetic neuropathy, unspecified: Secondary | ICD-10-CM | POA: Diagnosis not present

## 2022-08-17 DIAGNOSIS — E1122 Type 2 diabetes mellitus with diabetic chronic kidney disease: Secondary | ICD-10-CM | POA: Diagnosis not present

## 2022-08-17 NOTE — Telephone Encounter (Signed)
Patient LM asking for call back 

## 2022-08-18 ENCOUNTER — Telehealth: Payer: Self-pay | Admitting: Family

## 2022-08-18 ENCOUNTER — Other Ambulatory Visit: Payer: Self-pay

## 2022-08-18 DIAGNOSIS — I69354 Hemiplegia and hemiparesis following cerebral infarction affecting left non-dominant side: Secondary | ICD-10-CM | POA: Diagnosis not present

## 2022-08-18 DIAGNOSIS — F419 Anxiety disorder, unspecified: Secondary | ICD-10-CM | POA: Diagnosis not present

## 2022-08-18 DIAGNOSIS — N189 Chronic kidney disease, unspecified: Secondary | ICD-10-CM | POA: Diagnosis not present

## 2022-08-18 DIAGNOSIS — E1165 Type 2 diabetes mellitus with hyperglycemia: Secondary | ICD-10-CM

## 2022-08-18 DIAGNOSIS — I2510 Atherosclerotic heart disease of native coronary artery without angina pectoris: Secondary | ICD-10-CM | POA: Diagnosis not present

## 2022-08-18 DIAGNOSIS — E782 Mixed hyperlipidemia: Secondary | ICD-10-CM | POA: Diagnosis not present

## 2022-08-18 DIAGNOSIS — E1136 Type 2 diabetes mellitus with diabetic cataract: Secondary | ICD-10-CM | POA: Diagnosis not present

## 2022-08-18 DIAGNOSIS — E1122 Type 2 diabetes mellitus with diabetic chronic kidney disease: Secondary | ICD-10-CM | POA: Diagnosis not present

## 2022-08-18 DIAGNOSIS — I129 Hypertensive chronic kidney disease with stage 1 through stage 4 chronic kidney disease, or unspecified chronic kidney disease: Secondary | ICD-10-CM | POA: Diagnosis not present

## 2022-08-18 DIAGNOSIS — E1140 Type 2 diabetes mellitus with diabetic neuropathy, unspecified: Secondary | ICD-10-CM | POA: Diagnosis not present

## 2022-08-18 MED ORDER — FARXIGA 10 MG PO TABS
10.0000 mg | ORAL_TABLET | Freq: Every day | ORAL | 3 refills | Status: DC
Start: 2022-08-18 — End: 2023-07-26

## 2022-08-18 NOTE — Telephone Encounter (Signed)
Patient called requesting a foot brace for her left foot.

## 2022-08-20 ENCOUNTER — Other Ambulatory Visit: Payer: Self-pay | Admitting: Internal Medicine

## 2022-08-20 DIAGNOSIS — E1140 Type 2 diabetes mellitus with diabetic neuropathy, unspecified: Secondary | ICD-10-CM | POA: Diagnosis not present

## 2022-08-20 DIAGNOSIS — I6389 Other cerebral infarction: Secondary | ICD-10-CM | POA: Diagnosis not present

## 2022-08-20 DIAGNOSIS — D801 Nonfamilial hypogammaglobulinemia: Secondary | ICD-10-CM | POA: Diagnosis not present

## 2022-08-20 DIAGNOSIS — E1136 Type 2 diabetes mellitus with diabetic cataract: Secondary | ICD-10-CM | POA: Diagnosis not present

## 2022-08-20 DIAGNOSIS — I2510 Atherosclerotic heart disease of native coronary artery without angina pectoris: Secondary | ICD-10-CM | POA: Diagnosis not present

## 2022-08-20 DIAGNOSIS — R29898 Other symptoms and signs involving the musculoskeletal system: Secondary | ICD-10-CM

## 2022-08-20 DIAGNOSIS — N189 Chronic kidney disease, unspecified: Secondary | ICD-10-CM | POA: Diagnosis not present

## 2022-08-20 DIAGNOSIS — E782 Mixed hyperlipidemia: Secondary | ICD-10-CM | POA: Diagnosis not present

## 2022-08-20 DIAGNOSIS — I69354 Hemiplegia and hemiparesis following cerebral infarction affecting left non-dominant side: Secondary | ICD-10-CM | POA: Diagnosis not present

## 2022-08-20 DIAGNOSIS — E1122 Type 2 diabetes mellitus with diabetic chronic kidney disease: Secondary | ICD-10-CM | POA: Diagnosis not present

## 2022-08-20 DIAGNOSIS — I129 Hypertensive chronic kidney disease with stage 1 through stage 4 chronic kidney disease, or unspecified chronic kidney disease: Secondary | ICD-10-CM | POA: Diagnosis not present

## 2022-08-20 DIAGNOSIS — M6281 Muscle weakness (generalized): Secondary | ICD-10-CM | POA: Diagnosis not present

## 2022-08-20 DIAGNOSIS — F419 Anxiety disorder, unspecified: Secondary | ICD-10-CM | POA: Diagnosis not present

## 2022-08-21 NOTE — Telephone Encounter (Signed)
Request has been sent to [facility] for [place] [facility].

## 2022-08-25 ENCOUNTER — Ambulatory Visit (INDEPENDENT_AMBULATORY_CARE_PROVIDER_SITE_OTHER): Payer: Medicare HMO | Admitting: Internal Medicine

## 2022-08-25 ENCOUNTER — Encounter: Payer: Self-pay | Admitting: Internal Medicine

## 2022-08-25 VITALS — BP 139/70 | HR 64 | Ht 62.0 in | Wt 165.0 lb

## 2022-08-25 DIAGNOSIS — N950 Postmenopausal bleeding: Secondary | ICD-10-CM | POA: Diagnosis not present

## 2022-08-25 DIAGNOSIS — E782 Mixed hyperlipidemia: Secondary | ICD-10-CM | POA: Diagnosis not present

## 2022-08-25 DIAGNOSIS — E1165 Type 2 diabetes mellitus with hyperglycemia: Secondary | ICD-10-CM

## 2022-08-25 DIAGNOSIS — Z794 Long term (current) use of insulin: Secondary | ICD-10-CM | POA: Diagnosis not present

## 2022-08-25 DIAGNOSIS — I25718 Atherosclerosis of autologous vein coronary artery bypass graft(s) with other forms of angina pectoris: Secondary | ICD-10-CM

## 2022-08-25 DIAGNOSIS — I10 Essential (primary) hypertension: Secondary | ICD-10-CM

## 2022-08-25 DIAGNOSIS — G8194 Hemiplegia, unspecified affecting left nondominant side: Secondary | ICD-10-CM

## 2022-08-25 LAB — POCT CBG (FASTING - GLUCOSE)-MANUAL ENTRY: Glucose Fasting, POC: 131 mg/dL — AB (ref 70–99)

## 2022-08-25 MED ORDER — SEMAGLUTIDE(0.25 OR 0.5MG/DOS) 2 MG/3ML ~~LOC~~ SOPN
0.5000 mg | PEN_INJECTOR | SUBCUTANEOUS | 3 refills | Status: DC
Start: 2022-08-25 — End: 2022-10-06

## 2022-08-25 NOTE — Progress Notes (Signed)
Established Patient Office Visit  Subjective:  Patient ID: Danielle Paul, female    DOB: 12/08/1955  Age: 67 y.o. MRN: [id number]  Chief Complaint  Patient presents with   PPD Reading    1 mo F/U    Patient comes in for her follow-up today.  Currently she is taking Ozempic and she has lost weight.  She is feeling well in general.  Denies nausea or vomiting and no constipation.  Agrees to increase the dose to 0.5 mg/week. Her recent hemoglobin A1c was high so she was advised to increase her insulin to 30 units at bedtime.  Reports that since then her sugars are running better at home. She requests for a prescription for a foot brace, has already been sent. She reports that recently she noted some vaginal bleeding.  Patient is postmenopausal for several years.  Reports that it had happened before also few years back and she was evaluated by gynecologist who has since retired. This is concerning and will set up with  OB/GYN for further workup of postmenopausal bleeding.  Referral sent.    No other concerns at this time.   Past Medical History:  Diagnosis Date   Anxiety    Coronary artery disease    CVA (cerebral vascular accident) (HCC) 01/2020   Diabetes mellitus without complication (HCC)    Hypercholesteremia    Hypertension    Left-sided weakness    S/P stroke    Past Surgical History:  Procedure Laterality Date   AORTIC VALVE REPLACEMENT (AVR)/CORONARY ARTERY BYPASS GRAFTING (CABG)  2009   CAROTID ANGIOGRAPHY N/A 03/21/2020   Procedure: CAROTID ANGIOGRAPHY;  Surgeon: Annice Needy, MD;  Location: [facility] INVASIVE CV LAB;  Service: Cardiovascular;  Laterality: N/A;    Social History   Socioeconomic History   Marital status: Unknown    Spouse name: Not on file   Number of children: Not on file   Years of education: Not on file   Highest education level: Not on file  Occupational History   Not on file  Tobacco Use   Smoking status: Never   Smokeless tobacco: Never   Vaping Use   Vaping Use: Never used  Substance and Sexual Activity   Alcohol use: Never   Drug use: Never   Sexual activity: Not on file  Other Topics Concern   Not on file  Social History Narrative   Not on file   Social Determinants of Health   Financial Resource Strain: Not on file  Food Insecurity: No Food Insecurity (10/17/2021)   Hunger Vital Sign    Worried About Running Out of Food in the Last Year: Never true    Ran Out of Food in the Last Year: Never true  Transportation Needs: No Transportation Needs (10/17/2021)   PRAPARE - Administrator, Civil Service (Medical): No    Lack of Transportation (Non-Medical): No  Recent Concern: Transportation Needs - Unmet Transportation Needs (10/17/2021)   PRAPARE - Administrator, Civil Service (Medical): Yes    Lack of Transportation (Non-Medical): Yes  Physical Activity: Not on file  Stress: Not on file  Social Connections: Not on file  Intimate Partner Violence: Not on file    Family History  Problem Relation Age of Onset   Other Mother    Hypertension Mother    Hypertension Father     Allergies  Allergen Reactions   Atorvastatin Other (See Comments)    Muscle Pains   Isosorbide Nitrate Other (  See Comments)    Jittery and lightheadedness.   Pravastatin Other (See Comments)   Simvastatin Other (See Comments)    Muscle Pains    Review of Systems  Constitutional: Negative.   HENT: Negative.  Negative for sore throat.   Eyes: Negative.   Respiratory: Negative.  Negative for cough and shortness of breath.   Cardiovascular: Negative.  Negative for chest pain, palpitations and leg swelling.  Gastrointestinal: Negative.  Negative for abdominal pain, constipation, diarrhea, heartburn, nausea and vomiting.  Genitourinary: Negative.  Negative for dysuria and flank pain.  Musculoskeletal: Negative.  Negative for joint pain and myalgias.  Skin: Negative.   Neurological:  Positive for focal weakness  and weakness. Negative for dizziness and headaches.  Endo/Heme/Allergies: Negative.   Psychiatric/Behavioral: Negative.  Negative for depression and suicidal ideas. The patient is not nervous/anxious.        Objective:   BP 139/70   Pulse 64   Ht 5\' 2"  (1.575 m)   Wt 165 lb (74.8 kg)   SpO2 98%   BMI 30.18 kg/m   Vitals:   08/25/22 1303  BP: 139/70  Pulse: 64  Height: 5\' 2"  (1.575 m)  Weight: 165 lb (74.8 kg)  SpO2: 98%  BMI (Calculated): 30.17    Physical Exam Vitals and nursing note reviewed.  Constitutional:      Appearance: Normal appearance.  HENT:     Head: Normocephalic and atraumatic.     Nose: Nose normal.     Mouth/Throat:     Mouth: Mucous membranes are moist.     Pharynx: Oropharynx is clear.  Eyes:     Conjunctiva/sclera: Conjunctivae normal.     Pupils: Pupils are equal, round, and reactive to light.  Cardiovascular:     Rate and Rhythm: Normal rate and regular rhythm.     Pulses: Normal pulses.     Heart sounds: Normal heart sounds. No murmur heard. Pulmonary:     Effort: Pulmonary effort is normal.     Breath sounds: Normal breath sounds. No wheezing.  Abdominal:     General: Bowel sounds are normal.     Palpations: Abdomen is soft.     Tenderness: There is no abdominal tenderness. There is no right CVA tenderness or left CVA tenderness.  Musculoskeletal:        General: Normal range of motion.     Cervical back: Normal range of motion.     Right lower leg: No edema.     Left lower leg: No edema.  Skin:    General: Skin is warm and dry.  Neurological:     General: No focal deficit present.     Mental Status: She is alert and oriented to person, place, and time.  Psychiatric:        Mood and Affect: Mood normal.        Behavior: Behavior normal.      Results for orders placed or performed in visit on 08/25/22  POCT CBG (Fasting - Glucose)  Result Value Ref Range   Glucose Fasting, POC 131 (A) 70 - 99 mg/dL    Recent Results  (from the past 2160 hour(s))  POCT CBG (Fasting - Glucose)     Status: Abnormal   Collection Time: 06/18/22 10:09 AM  Result Value Ref Range   Glucose Fasting, POC 155 (A) 70 - 99 mg/dL  POCT CBG (Fasting - Glucose)     Status: Abnormal   Collection Time: 07/23/22  1:06 PM  Result Value Ref  Range   Glucose Fasting, POC 124 (A) 70 - 99 mg/dL  CBC With Differential     Status: Abnormal   Collection Time: 07/23/22  1:34 PM  Result Value Ref Range   WBC 5.6 3.4 - 10.8 x10E3/uL   RBC 5.27 3.77 - 5.28 x10E6/uL   Hemoglobin 13.6 11.1 - 15.9 g/dL   Hematocrit 16.1 09.6 - 46.6 %   MCV 81 79 - 97 fL   MCH 25.8 (L) 26.6 - 33.0 pg   MCHC 31.9 31.5 - 35.7 g/dL   RDW 04.5 40.9 - 81.1 %   Neutrophils 56 Not Estab. %   Lymphs 32 Not Estab. %   Monocytes 8 Not Estab. %   Eos 2 Not Estab. %   Basos 1 Not Estab. %   Neutrophils Absolute 3.2 1.4 - 7.0 x10E3/uL   Lymphocytes Absolute 1.8 0.7 - 3.1 x10E3/uL   Monocytes Absolute 0.4 0.1 - 0.9 x10E3/uL   EOS (ABSOLUTE) 0.1 0.0 - 0.4 x10E3/uL   Basophils Absolute 0.0 0.0 - 0.2 x10E3/uL   Immature Granulocytes 1 Not Estab. %   Immature Grans (Abs) 0.1 0.0 - 0.1 x10E3/uL  CMP14+EGFR     Status: Abnormal   Collection Time: 07/23/22  1:34 PM  Result Value Ref Range   Glucose 126 (H) 70 - 99 mg/dL   BUN 18 8 - 27 mg/dL   Creatinine, Ser 9.14 (H) 0.57 - 1.00 mg/dL   eGFR 49 (L) >78 GN/FAO/1.30   BUN/Creatinine Ratio 15 12 - 28   Sodium 139 134 - 144 mmol/L   Potassium 4.4 3.5 - 5.2 mmol/L   Chloride 101 96 - 106 mmol/L   CO2 25 20 - 29 mmol/L   Calcium 9.6 8.7 - 10.3 mg/dL   Total Protein 7.0 6.0 - 8.5 g/dL   Albumin 4.2 3.9 - 4.9 g/dL   Globulin, Total 2.8 1.5 - 4.5 g/dL   Albumin/Globulin Ratio 1.5 1.2 - 2.2   Bilirubin Total 0.3 0.0 - 1.2 mg/dL   Alkaline Phosphatase 110 44 - 121 IU/L   AST 19 0 - 40 IU/L   ALT 11 0 - 32 IU/L  Lipid Panel w/o Chol/HDL Ratio     Status: Abnormal   Collection Time: 07/23/22  1:34 PM  Result Value Ref Range    Cholesterol, Total 213 (H) 100 - 199 mg/dL   Triglycerides 83 0 - 149 mg/dL   HDL 48 >86 mg/dL   VLDL Cholesterol Cal 15 5 - 40 mg/dL   LDL Chol Calc (NIH) 578 (H) 0 - 99 mg/dL  Hemoglobin I6N     Status: Abnormal   Collection Time: 07/23/22  1:34 PM  Result Value Ref Range   Hgb A1c MFr Bld 10.4 (H) 4.8 - 5.6 %    Comment:          Prediabetes: 5.7 - 6.4          Diabetes: >6.4          Glycemic control for adults with diabetes: <7.0    Est. average glucose Bld gHb Est-mCnc 252 mg/dL  POCT CBG (Fasting - Glucose)     Status: Abnormal   Collection Time: 08/25/22  1:22 PM  Result Value Ref Range   Glucose Fasting, POC 131 (A) 70 - 99 mg/dL      Assessment & Plan:  Increase dose of Ozempic.  Monitor blood sugars at home.  OB/GYN referral for postmenopausal bleeding. Problem List Items Addressed This Visit  Essential hypertension, benign   Hyperlipidemia   Coronary artery disease of autologous vein bypass graft with stable angina pectoris (HCC)   Type 2 diabetes mellitus with hyperglycemia, with long-term current use of insulin (HCC) - Primary   Relevant Medications   Semaglutide,0.25 or 0.5MG /DOS, 2 MG/3ML SOPN   Other Relevant Orders   POCT CBG (Fasting - Glucose) (Completed)   Left hemiparesis (HCC)   Other Visit Diagnoses     Postmenopausal bleeding       Relevant Orders   Ambulatory referral to Obstetrics / Gynecology       Return in about 6 weeks (around 10/06/2022).   Total time spent: 30 minutes  Margaretann Loveless, MD  08/25/2022   This document may have been prepared by [facility] Voice Recognition software and as such may include unintentional dictation errors.

## 2022-08-26 ENCOUNTER — Other Ambulatory Visit: Payer: Self-pay | Admitting: Internal Medicine

## 2022-08-26 ENCOUNTER — Telehealth: Payer: Self-pay | Admitting: Internal Medicine

## 2022-08-26 DIAGNOSIS — E782 Mixed hyperlipidemia: Secondary | ICD-10-CM | POA: Diagnosis not present

## 2022-08-26 DIAGNOSIS — F419 Anxiety disorder, unspecified: Secondary | ICD-10-CM | POA: Diagnosis not present

## 2022-08-26 DIAGNOSIS — I2510 Atherosclerotic heart disease of native coronary artery without angina pectoris: Secondary | ICD-10-CM | POA: Diagnosis not present

## 2022-08-26 DIAGNOSIS — E1165 Type 2 diabetes mellitus with hyperglycemia: Secondary | ICD-10-CM

## 2022-08-26 DIAGNOSIS — E1136 Type 2 diabetes mellitus with diabetic cataract: Secondary | ICD-10-CM | POA: Diagnosis not present

## 2022-08-26 DIAGNOSIS — I69354 Hemiplegia and hemiparesis following cerebral infarction affecting left non-dominant side: Secondary | ICD-10-CM | POA: Diagnosis not present

## 2022-08-26 DIAGNOSIS — E1122 Type 2 diabetes mellitus with diabetic chronic kidney disease: Secondary | ICD-10-CM | POA: Diagnosis not present

## 2022-08-26 DIAGNOSIS — I129 Hypertensive chronic kidney disease with stage 1 through stage 4 chronic kidney disease, or unspecified chronic kidney disease: Secondary | ICD-10-CM | POA: Diagnosis not present

## 2022-08-26 DIAGNOSIS — E1140 Type 2 diabetes mellitus with diabetic neuropathy, unspecified: Secondary | ICD-10-CM | POA: Diagnosis not present

## 2022-08-26 DIAGNOSIS — N189 Chronic kidney disease, unspecified: Secondary | ICD-10-CM | POA: Diagnosis not present

## 2022-08-26 NOTE — Telephone Encounter (Signed)
Patient left VM requesting prescription for foot brace be sent to [facility]. Please advise.

## 2022-08-31 DIAGNOSIS — E782 Mixed hyperlipidemia: Secondary | ICD-10-CM | POA: Diagnosis not present

## 2022-08-31 DIAGNOSIS — F419 Anxiety disorder, unspecified: Secondary | ICD-10-CM | POA: Diagnosis not present

## 2022-08-31 DIAGNOSIS — I129 Hypertensive chronic kidney disease with stage 1 through stage 4 chronic kidney disease, or unspecified chronic kidney disease: Secondary | ICD-10-CM | POA: Diagnosis not present

## 2022-08-31 DIAGNOSIS — E1140 Type 2 diabetes mellitus with diabetic neuropathy, unspecified: Secondary | ICD-10-CM | POA: Diagnosis not present

## 2022-08-31 DIAGNOSIS — N189 Chronic kidney disease, unspecified: Secondary | ICD-10-CM | POA: Diagnosis not present

## 2022-08-31 DIAGNOSIS — E1136 Type 2 diabetes mellitus with diabetic cataract: Secondary | ICD-10-CM | POA: Diagnosis not present

## 2022-08-31 DIAGNOSIS — I2510 Atherosclerotic heart disease of native coronary artery without angina pectoris: Secondary | ICD-10-CM | POA: Diagnosis not present

## 2022-08-31 DIAGNOSIS — E1122 Type 2 diabetes mellitus with diabetic chronic kidney disease: Secondary | ICD-10-CM | POA: Diagnosis not present

## 2022-08-31 DIAGNOSIS — I69354 Hemiplegia and hemiparesis following cerebral infarction affecting left non-dominant side: Secondary | ICD-10-CM | POA: Diagnosis not present

## 2022-09-01 ENCOUNTER — Other Ambulatory Visit: Payer: Self-pay | Admitting: Family

## 2022-09-01 DIAGNOSIS — E1140 Type 2 diabetes mellitus with diabetic neuropathy, unspecified: Secondary | ICD-10-CM | POA: Diagnosis not present

## 2022-09-01 DIAGNOSIS — N189 Chronic kidney disease, unspecified: Secondary | ICD-10-CM | POA: Diagnosis not present

## 2022-09-01 DIAGNOSIS — I2510 Atherosclerotic heart disease of native coronary artery without angina pectoris: Secondary | ICD-10-CM | POA: Diagnosis not present

## 2022-09-01 DIAGNOSIS — E1136 Type 2 diabetes mellitus with diabetic cataract: Secondary | ICD-10-CM | POA: Diagnosis not present

## 2022-09-01 DIAGNOSIS — I129 Hypertensive chronic kidney disease with stage 1 through stage 4 chronic kidney disease, or unspecified chronic kidney disease: Secondary | ICD-10-CM | POA: Diagnosis not present

## 2022-09-01 DIAGNOSIS — E1122 Type 2 diabetes mellitus with diabetic chronic kidney disease: Secondary | ICD-10-CM | POA: Diagnosis not present

## 2022-09-01 DIAGNOSIS — E782 Mixed hyperlipidemia: Secondary | ICD-10-CM | POA: Diagnosis not present

## 2022-09-01 DIAGNOSIS — F419 Anxiety disorder, unspecified: Secondary | ICD-10-CM | POA: Diagnosis not present

## 2022-09-01 DIAGNOSIS — E1165 Type 2 diabetes mellitus with hyperglycemia: Secondary | ICD-10-CM

## 2022-09-01 DIAGNOSIS — I69354 Hemiplegia and hemiparesis following cerebral infarction affecting left non-dominant side: Secondary | ICD-10-CM | POA: Diagnosis not present

## 2022-09-03 DIAGNOSIS — I69354 Hemiplegia and hemiparesis following cerebral infarction affecting left non-dominant side: Secondary | ICD-10-CM | POA: Diagnosis not present

## 2022-09-03 DIAGNOSIS — I2510 Atherosclerotic heart disease of native coronary artery without angina pectoris: Secondary | ICD-10-CM | POA: Diagnosis not present

## 2022-09-03 DIAGNOSIS — E782 Mixed hyperlipidemia: Secondary | ICD-10-CM | POA: Diagnosis not present

## 2022-09-03 DIAGNOSIS — F419 Anxiety disorder, unspecified: Secondary | ICD-10-CM | POA: Diagnosis not present

## 2022-09-03 DIAGNOSIS — E1136 Type 2 diabetes mellitus with diabetic cataract: Secondary | ICD-10-CM | POA: Diagnosis not present

## 2022-09-03 DIAGNOSIS — N189 Chronic kidney disease, unspecified: Secondary | ICD-10-CM | POA: Diagnosis not present

## 2022-09-03 DIAGNOSIS — I129 Hypertensive chronic kidney disease with stage 1 through stage 4 chronic kidney disease, or unspecified chronic kidney disease: Secondary | ICD-10-CM | POA: Diagnosis not present

## 2022-09-03 DIAGNOSIS — E1122 Type 2 diabetes mellitus with diabetic chronic kidney disease: Secondary | ICD-10-CM | POA: Diagnosis not present

## 2022-09-03 DIAGNOSIS — E1140 Type 2 diabetes mellitus with diabetic neuropathy, unspecified: Secondary | ICD-10-CM | POA: Diagnosis not present

## 2022-09-08 DIAGNOSIS — E1140 Type 2 diabetes mellitus with diabetic neuropathy, unspecified: Secondary | ICD-10-CM | POA: Diagnosis not present

## 2022-09-08 DIAGNOSIS — I2510 Atherosclerotic heart disease of native coronary artery without angina pectoris: Secondary | ICD-10-CM | POA: Diagnosis not present

## 2022-09-08 DIAGNOSIS — E782 Mixed hyperlipidemia: Secondary | ICD-10-CM | POA: Diagnosis not present

## 2022-09-08 DIAGNOSIS — I69354 Hemiplegia and hemiparesis following cerebral infarction affecting left non-dominant side: Secondary | ICD-10-CM | POA: Diagnosis not present

## 2022-09-08 DIAGNOSIS — E1122 Type 2 diabetes mellitus with diabetic chronic kidney disease: Secondary | ICD-10-CM | POA: Diagnosis not present

## 2022-09-08 DIAGNOSIS — E1136 Type 2 diabetes mellitus with diabetic cataract: Secondary | ICD-10-CM | POA: Diagnosis not present

## 2022-09-08 DIAGNOSIS — I129 Hypertensive chronic kidney disease with stage 1 through stage 4 chronic kidney disease, or unspecified chronic kidney disease: Secondary | ICD-10-CM | POA: Diagnosis not present

## 2022-09-08 DIAGNOSIS — N189 Chronic kidney disease, unspecified: Secondary | ICD-10-CM | POA: Diagnosis not present

## 2022-09-08 DIAGNOSIS — F419 Anxiety disorder, unspecified: Secondary | ICD-10-CM | POA: Diagnosis not present

## 2022-09-09 ENCOUNTER — Ambulatory Visit (INDEPENDENT_AMBULATORY_CARE_PROVIDER_SITE_OTHER): Payer: Medicare HMO | Admitting: Certified Nurse Midwife

## 2022-09-09 ENCOUNTER — Encounter: Payer: Self-pay | Admitting: Certified Nurse Midwife

## 2022-09-09 ENCOUNTER — Other Ambulatory Visit (HOSPITAL_COMMUNITY)
Admission: RE | Admit: 2022-09-09 | Discharge: 2022-09-09 | Disposition: A | Discharge status: Home / Self Care | Payer: Medicare HMO | Source: Ambulatory Visit | Attending: Certified Nurse Midwife | Admitting: Certified Nurse Midwife

## 2022-09-09 VITALS — BP 178/89 | HR 71 | Wt 165.0 lb

## 2022-09-09 DIAGNOSIS — N898 Other specified noninflammatory disorders of vagina: Secondary | ICD-10-CM

## 2022-09-09 DIAGNOSIS — N189 Chronic kidney disease, unspecified: Secondary | ICD-10-CM | POA: Diagnosis not present

## 2022-09-09 DIAGNOSIS — E1140 Type 2 diabetes mellitus with diabetic neuropathy, unspecified: Secondary | ICD-10-CM | POA: Diagnosis not present

## 2022-09-09 DIAGNOSIS — Z1151 Encounter for screening for human papillomavirus (HPV): Secondary | ICD-10-CM | POA: Diagnosis not present

## 2022-09-09 DIAGNOSIS — E782 Mixed hyperlipidemia: Secondary | ICD-10-CM | POA: Diagnosis not present

## 2022-09-09 DIAGNOSIS — E1136 Type 2 diabetes mellitus with diabetic cataract: Secondary | ICD-10-CM | POA: Diagnosis not present

## 2022-09-09 DIAGNOSIS — E1122 Type 2 diabetes mellitus with diabetic chronic kidney disease: Secondary | ICD-10-CM | POA: Diagnosis not present

## 2022-09-09 DIAGNOSIS — Z124 Encounter for screening for malignant neoplasm of cervix: Secondary | ICD-10-CM

## 2022-09-09 DIAGNOSIS — Z01419 Encounter for gynecological examination (general) (routine) without abnormal findings: Secondary | ICD-10-CM | POA: Diagnosis not present

## 2022-09-09 DIAGNOSIS — I2510 Atherosclerotic heart disease of native coronary artery without angina pectoris: Secondary | ICD-10-CM | POA: Diagnosis not present

## 2022-09-09 DIAGNOSIS — F419 Anxiety disorder, unspecified: Secondary | ICD-10-CM | POA: Diagnosis not present

## 2022-09-09 DIAGNOSIS — N950 Postmenopausal bleeding: Secondary | ICD-10-CM | POA: Diagnosis not present

## 2022-09-09 DIAGNOSIS — I129 Hypertensive chronic kidney disease with stage 1 through stage 4 chronic kidney disease, or unspecified chronic kidney disease: Secondary | ICD-10-CM | POA: Diagnosis not present

## 2022-09-09 DIAGNOSIS — I69354 Hemiplegia and hemiparesis following cerebral infarction affecting left non-dominant side: Secondary | ICD-10-CM | POA: Diagnosis not present

## 2022-09-09 NOTE — Patient Instructions (Signed)
Postmenopausal Bleeding Postmenopausal bleeding is any bleeding that occurs after menopause. Menopause is a time in a woman's life when monthly periods stop. Any type of bleeding after menopause should be checked by your doctor. Treatment will depend on the cause. This kind of bleeding can be caused by: Taking hormones during menopause. Low or high amounts of female hormones in the body. This can cause the lining of the womb (uterus) to become too thin or too thick. Cancer. Growths in the womb that are not cancer. Follow these instructions at home:  Watch for any changes in your symptoms. Let your doctor know about them. Avoid using tampons and douches as told by your doctor. Change your pads regularly. Get regular pelvic exams. This includes Pap tests. Take iron pills as told by your doctor. Take over-the-counter and prescription medicines only as told by your doctor. Keep all follow-up visits. Contact a doctor if: You have new bleeding from the vagina after menopause. You have pain in your belly (abdomen). Get help right away if: You have a fever or chills. You have very bad pain with bleeding. You have clumps of blood (blood clots) coming from your vagina. You have a lot of bleeding, and: You use more than 1 pad an hour. This kind of bleeding has never happened before. You have headaches. You feel dizzy or you feel like you are going to pass out (faint). Summary Any type of bleeding after menopause should be checked by your doctor. Avoid using tampons or douches. Get regular pelvic exams. This includes Pap tests. Contact a doctor if you have new bleeding or pain in your belly. Watch for any changes in your symptoms. Let your doctor know about them. This information is not intended to replace advice given to you by your health care provider. Make sure you discuss any questions you have with your health care provider. Document Revised: 07/27/2019 Document Reviewed:  07/27/2019 Elsevier Patient Education  2024 [employer].

## 2022-09-09 NOTE — Progress Notes (Signed)
GYN ENCOUNTER NOTE  Subjective:       Danielle Paul is a 67 y.o. No obstetric history on file. female is here for gynecologic evaluation of the following issues:  1. Post menopausal bleeding x 2 weeks. Pt states it started 2 weeks ago and was saturating the depends pad . Since then it has increased and decreased in amount. She denies any pain.     Gynecologic History No LMP recorded. Patient is postmenopausal. Contraception: post menopausal status Last Pap: 2014. Results were: normal Last mammogram: 10/02/2021. Results were: normal  Obstetric History OB History  No obstetric history on file.    Past Medical History:  Diagnosis Date   Anxiety    Coronary artery disease    CVA (cerebral vascular accident) (HCC) 01/2020   Diabetes mellitus without complication (HCC)    Hypercholesteremia    Hypertension    Left-sided weakness    S/P stroke    Past Surgical History:  Procedure Laterality Date   AORTIC VALVE REPLACEMENT (AVR)/CORONARY ARTERY BYPASS GRAFTING (CABG)  2009   CAROTID ANGIOGRAPHY N/A 03/21/2020   Procedure: CAROTID ANGIOGRAPHY;  Surgeon: Annice Needy, MD;  Location: [facility] INVASIVE CV LAB;  Service: Cardiovascular;  Laterality: N/A;    Current Outpatient Medications on File Prior to Visit  Medication Sig Dispense Refill   ACCU-CHEK GUIDE test strip      amLODipine (NORVASC) 5 MG tablet TAKE ONE TABLET BY MOUTH DAILY 90 tablet 3   Ascorbic Acid (VITAMIN C) 100 MG tablet Take 1 tablet (100 mg total) by mouth daily. 90 tablet 3   ASPIRIN LOW DOSE 81 MG tablet TAKE ONE TABLET BY MOUTH DAILY 90 tablet 2   B-D ULTRAFINE III SHORT PEN 31G X 8 MM MISC USE TO INJECT INSULIN FOUR TIMES DAILY 100 each 3   Blood Glucose Monitoring Suppl (TRUE METRIX AIR GLUCOSE METER) w/Device KIT 1 each by Does not apply route 3 (three) times daily. 1 kit 0   carvedilol (COREG) 25 MG tablet TAKE ONE TABLET BY MOUTH TWICE DAILY 180 tablet 3   Cholecalciferol 1.25 MG (50000 UT) capsule Take 1  capsule (50,000 Units total) by mouth once a week. 12 capsule 3   clopidogrel (PLAVIX) 75 MG tablet TAKE ONE TABLET BY MOUTH DAILY 90 tablet 2   Continuous Glucose Receiver (FREESTYLE LIBRE 2 READER) DEVI Use to check glucose up to twice daily 1 each 0   CYANOCOBALAMIN PO Take by mouth daily.     FARXIGA 10 MG TABS tablet Take 1 tablet (10 mg total) by mouth daily. 90 tablet 3   FLUoxetine (PROZAC) 10 MG capsule TAKE ONE CAPSULE BY MOUTH DAILY 90 capsule 3   gabapentin (NEURONTIN) 100 MG capsule TAKE ONE CAPSULE BY MOUTH AT BEDTIME 90 capsule 3   glucose blood (TRUE METRIX BLOOD GLUCOSE TEST) test strip 1 each by Other route 3 (three) times daily. Use as instructed 270 each 3   GNP Sterile Lancets 33G MISC USE AS DIRECTED TO CHECK GLUCOSE 100 each 3   LANTUS SOLOSTAR 100 UNIT/ML Solostar Pen INJECT 25 UNITS UNDER THE SKIN (SUBCUTANEOUSLY) AT BEDTIME 27 mL 2   nitroGLYCERIN (NITROSTAT) 0.3 MG SL tablet DISSOLVE 1 TABLET UNDER TONGUE AS NEEDED FOR CHEST PAIN. MAY REPEAT IN 5 MINUTES AS DIRECTED 100 tablet 3   NOVOLOG FLEXPEN 100 UNIT/ML FlexPen USE AS DIRECTED INJECT THREE TIMES DAILY UNDER THE SKIN (SUBCUTANEOUSLY) PER SLIDING SCALE MAX OF 30 UNITS DAILY 9 mL 0   ONETOUCH VERIO test  strip USE TO CHECK BLOOD SUGARS THREE TIMES DAILY 270 strip 3   polyethylene glycol (MIRALAX / GLYCOLAX) 17 g packet Take 17 g by mouth daily. (Patient not taking: Reported on 05/13/2021) 14 each 0   rosuvastatin (CRESTOR) 40 MG tablet TAKE ONE TABLET BY MOUTH DAILY 90 tablet 2   Semaglutide,0.25 or 0.5MG /DOS, 2 MG/3ML SOPN Inject 0.5 mg into the skin once a week. 2 mL 3   No current facility-administered medications on file prior to visit.    Allergies  Allergen Reactions   Atorvastatin Other (See Comments)    Muscle Pains   Isosorbide Nitrate Other (See Comments)    Jittery and lightheadedness.   Pravastatin Other (See Comments)   Simvastatin Other (See Comments)    Muscle Pains    Social History    Socioeconomic History   Marital status: Unknown    Spouse name: Not on file   Number of children: Not on file   Years of education: Not on file   Highest education level: Not on file  Occupational History   Not on file  Tobacco Use   Smoking status: Never   Smokeless tobacco: Never  Vaping Use   Vaping status: Never Used  Substance and Sexual Activity   Alcohol use: Never   Drug use: Never   Sexual activity: Not on file  Other Topics Concern   Not on file  Social History Narrative   Not on file   Social Determinants of Health   Financial Resource Strain: Medium Risk (01/16/2019)   Received from [facility], [facility]   Overall Financial Resource Strain (CARDIA)    Difficulty of Paying Living Expenses: Somewhat hard  Food Insecurity: No Food Insecurity (10/17/2021)   Hunger Vital Sign    Worried About Running Out of Food in the Last Year: Never true    Ran Out of Food in the Last Year: Never true  Transportation Needs: No Transportation Needs (10/17/2021)   PRAPARE - Administrator, Civil Service (Medical): No    Lack of Transportation (Non-Medical): No  Recent Concern: Transportation Needs - Unmet Transportation Needs (10/17/2021)   PRAPARE - Transportation    Lack of Transportation (Medical): Yes    Lack of Transportation (Non-Medical): Yes  Physical Activity: Sufficiently Active (01/16/2019)   Received from [facility], [facility]   Exercise Vital Sign    Days of Exercise per Week: 5 days    Minutes of Exercise per Session: 30 min  Stress: No Stress Concern Present (01/16/2019)   Received from [facility], [facility]   [employer] of Occupational Health - Occupational Stress Questionnaire    Feeling of Stress : Only a little  Social Connections: Moderately Integrated (01/16/2019)   Received from [facility], [facility]   Social Connection and Isolation Panel [NHANES]    Frequency of Communication with Friends and Family: More than three times a week    Frequency of Social Gatherings with Friends and Family: Once a week    Attends Religious Services: More than 4 times per year    Active Member of [employer] or Organizations: No    Attends Banker Meetings: Never    Marital Status: Married  Catering manager Violence: Not on file    Family History  Problem Relation Age of Onset   Other Mother    Hypertension Mother    Hypertension Father  The following portions of the patient's history were reviewed and updated as appropriate: allergies, current medications, past family history, past medical history, past social history, past surgical history and problem list.  Review of Systems Review of Systems - Negative except as mentioned in HPI Review of Systems - General ROS: negative for - chills, fatigue, fever, hot flashes, malaise or night sweats Hematological and Lymphatic ROS: negative for - bleeding problems or swollen lymph nodes Gastrointestinal ROS: negative for - abdominal pain, blood in stools, change in bowel habits and nausea/vomiting Musculoskeletal ROS: negative for - joint pain, muscle pain or muscular weakness Genito-Urinary ROS: negative for - change in menstrual cycle, dysmenorrhea, dyspareunia, dysuria, genital discharge, genital ulcers, hematuria, incontinence, nocturia or pelvic pain. Positive for postmenopausal bleeding   Objective:   178/89 P-71 weight 165lbs CONSTITUTIONAL: Well-developed, well-nourished female in no acute distress.  HENT:  Normocephalic, atraumatic.  NECK: Normal range of motion, supple, no masses.  Normal thyroid.  SKIN: Skin is warm and dry. No rash noted. Not diaphoretic. No erythema. No pallor. NEUROLGIC: Alert and oriented to person, place, and time. PSYCHIATRIC: Normal mood and affect. Normal behavior. Normal judgment and  thought content. CARDIOVASCULAR:Not Examined RESPIRATORY: Not Examined BREASTS: Not Examined ABDOMEN: Soft, non distended; Non tender.  No Organomegaly. PELVIC:  External Genitalia: Normal  BUS: Normal  Vagina: Normal, no blood in vagina, mild odor, mild redness  Cervix: Normal, no blood noted. Mild redness.   Uterus: pt unable to tolerate digital exam  Adnexa: pt not able to tolerate digital exam   RV: Normal   Bladder: Nontender MUSCULOSKELETAL: Normal range of motion. No tenderness.  No cyanosis, clubbing, or edema.     Assessment:   Post menopausal bleeding     Plan:   Discussed ultrasound for further evaluation. She is in agreement. Pt has not had a pap smear sine 2014- pap completed today. Will follow up with u/s results. Reviewed endometrial biopsy for thickened endometrium ( awaiting u/s),  She verbalizes understanding. Will follow up with results.   Doreene Burke, CNM

## 2022-09-10 DIAGNOSIS — I2510 Atherosclerotic heart disease of native coronary artery without angina pectoris: Secondary | ICD-10-CM | POA: Diagnosis not present

## 2022-09-10 DIAGNOSIS — I69354 Hemiplegia and hemiparesis following cerebral infarction affecting left non-dominant side: Secondary | ICD-10-CM | POA: Diagnosis not present

## 2022-09-10 DIAGNOSIS — E782 Mixed hyperlipidemia: Secondary | ICD-10-CM | POA: Diagnosis not present

## 2022-09-10 DIAGNOSIS — E1122 Type 2 diabetes mellitus with diabetic chronic kidney disease: Secondary | ICD-10-CM | POA: Diagnosis not present

## 2022-09-10 DIAGNOSIS — N189 Chronic kidney disease, unspecified: Secondary | ICD-10-CM | POA: Diagnosis not present

## 2022-09-10 DIAGNOSIS — I129 Hypertensive chronic kidney disease with stage 1 through stage 4 chronic kidney disease, or unspecified chronic kidney disease: Secondary | ICD-10-CM | POA: Diagnosis not present

## 2022-09-10 DIAGNOSIS — E1140 Type 2 diabetes mellitus with diabetic neuropathy, unspecified: Secondary | ICD-10-CM | POA: Diagnosis not present

## 2022-09-10 DIAGNOSIS — F419 Anxiety disorder, unspecified: Secondary | ICD-10-CM | POA: Diagnosis not present

## 2022-09-10 DIAGNOSIS — E1136 Type 2 diabetes mellitus with diabetic cataract: Secondary | ICD-10-CM | POA: Diagnosis not present

## 2022-09-11 ENCOUNTER — Telehealth: Payer: Self-pay

## 2022-09-11 LAB — CERVICOVAGINAL ANCILLARY ONLY
Bacterial Vaginitis (gardnerella): POSITIVE — AB
Candida Glabrata: NEGATIVE
Candida Vaginitis: NEGATIVE
Chlamydia: NEGATIVE
Comment: NEGATIVE
Comment: NEGATIVE
Comment: NEGATIVE
Comment: NEGATIVE
Comment: NEGATIVE
Comment: NORMAL
Neisseria Gonorrhea: NEGATIVE
Trichomonas: POSITIVE — AB

## 2022-09-11 LAB — CYTOLOGY - PAP
Comment: NEGATIVE
Diagnosis: NEGATIVE
High risk HPV: NEGATIVE

## 2022-09-11 NOTE — Telephone Encounter (Signed)
Spoke with [facility] who has noted confirmation on verbal order for PT.

## 2022-09-12 DIAGNOSIS — E1140 Type 2 diabetes mellitus with diabetic neuropathy, unspecified: Secondary | ICD-10-CM | POA: Diagnosis not present

## 2022-09-12 DIAGNOSIS — I69354 Hemiplegia and hemiparesis following cerebral infarction affecting left non-dominant side: Secondary | ICD-10-CM | POA: Diagnosis not present

## 2022-09-12 DIAGNOSIS — N189 Chronic kidney disease, unspecified: Secondary | ICD-10-CM | POA: Diagnosis not present

## 2022-09-12 DIAGNOSIS — F419 Anxiety disorder, unspecified: Secondary | ICD-10-CM | POA: Diagnosis not present

## 2022-09-12 DIAGNOSIS — E1122 Type 2 diabetes mellitus with diabetic chronic kidney disease: Secondary | ICD-10-CM | POA: Diagnosis not present

## 2022-09-12 DIAGNOSIS — I2510 Atherosclerotic heart disease of native coronary artery without angina pectoris: Secondary | ICD-10-CM | POA: Diagnosis not present

## 2022-09-12 DIAGNOSIS — I129 Hypertensive chronic kidney disease with stage 1 through stage 4 chronic kidney disease, or unspecified chronic kidney disease: Secondary | ICD-10-CM | POA: Diagnosis not present

## 2022-09-12 DIAGNOSIS — E782 Mixed hyperlipidemia: Secondary | ICD-10-CM | POA: Diagnosis not present

## 2022-09-12 DIAGNOSIS — E1136 Type 2 diabetes mellitus with diabetic cataract: Secondary | ICD-10-CM | POA: Diagnosis not present

## 2022-09-13 ENCOUNTER — Other Ambulatory Visit: Payer: Self-pay | Admitting: Certified Nurse Midwife

## 2022-09-13 MED ORDER — METRONIDAZOLE 500 MG PO TABS: 500.0000 mg | ORAL_TABLET | Freq: Two times a day (BID) | ORAL | 0 refills | Status: AC

## 2022-09-14 ENCOUNTER — Telehealth: Payer: Self-pay | Admitting: Internal Medicine

## 2022-09-14 NOTE — Telephone Encounter (Signed)
Junious Dresser, OT with [facility] left VM wanting verbal orders for OT once a week for 4 weeks then once every other week. Also to extend home health aide for once a week for 8 weeks.   Callback # [phone]

## 2022-09-15 ENCOUNTER — Telehealth: Payer: Self-pay | Admitting: Certified Nurse Midwife

## 2022-09-15 ENCOUNTER — Ambulatory Visit
Admission: RE | Admit: 2022-09-15 | Discharge: 2022-09-15 | Disposition: A | Discharge status: Home / Self Care | Payer: Medicare HMO | Source: Ambulatory Visit | Attending: Certified Nurse Midwife | Admitting: Certified Nurse Midwife

## 2022-09-15 ENCOUNTER — Other Ambulatory Visit: Payer: Self-pay | Admitting: Certified Nurse Midwife

## 2022-09-15 DIAGNOSIS — Z124 Encounter for screening for malignant neoplasm of cervix: Secondary | ICD-10-CM

## 2022-09-15 DIAGNOSIS — E782 Mixed hyperlipidemia: Secondary | ICD-10-CM | POA: Diagnosis not present

## 2022-09-15 DIAGNOSIS — E1136 Type 2 diabetes mellitus with diabetic cataract: Secondary | ICD-10-CM | POA: Diagnosis not present

## 2022-09-15 DIAGNOSIS — F419 Anxiety disorder, unspecified: Secondary | ICD-10-CM | POA: Diagnosis not present

## 2022-09-15 DIAGNOSIS — N950 Postmenopausal bleeding: Secondary | ICD-10-CM

## 2022-09-15 DIAGNOSIS — N898 Other specified noninflammatory disorders of vagina: Secondary | ICD-10-CM

## 2022-09-15 DIAGNOSIS — I2510 Atherosclerotic heart disease of native coronary artery without angina pectoris: Secondary | ICD-10-CM | POA: Diagnosis not present

## 2022-09-15 DIAGNOSIS — I129 Hypertensive chronic kidney disease with stage 1 through stage 4 chronic kidney disease, or unspecified chronic kidney disease: Secondary | ICD-10-CM | POA: Diagnosis not present

## 2022-09-15 DIAGNOSIS — I69354 Hemiplegia and hemiparesis following cerebral infarction affecting left non-dominant side: Secondary | ICD-10-CM | POA: Diagnosis not present

## 2022-09-15 DIAGNOSIS — E1122 Type 2 diabetes mellitus with diabetic chronic kidney disease: Secondary | ICD-10-CM | POA: Diagnosis not present

## 2022-09-15 DIAGNOSIS — N189 Chronic kidney disease, unspecified: Secondary | ICD-10-CM | POA: Diagnosis not present

## 2022-09-15 DIAGNOSIS — N854 Malposition of uterus: Secondary | ICD-10-CM | POA: Diagnosis not present

## 2022-09-15 DIAGNOSIS — N939 Abnormal uterine and vaginal bleeding, unspecified: Secondary | ICD-10-CM | POA: Diagnosis not present

## 2022-09-15 DIAGNOSIS — E1140 Type 2 diabetes mellitus with diabetic neuropathy, unspecified: Secondary | ICD-10-CM | POA: Diagnosis not present

## 2022-09-15 NOTE — Telephone Encounter (Signed)
Called pt , reviewed lab results . All questions answered. She has u/s scheduled for this afternoon. Will follow up with results.   Doreene Burke, CNM

## 2022-09-16 ENCOUNTER — Telehealth: Payer: Self-pay | Admitting: Internal Medicine

## 2022-09-16 DIAGNOSIS — E1122 Type 2 diabetes mellitus with diabetic chronic kidney disease: Secondary | ICD-10-CM | POA: Diagnosis not present

## 2022-09-16 DIAGNOSIS — E782 Mixed hyperlipidemia: Secondary | ICD-10-CM | POA: Diagnosis not present

## 2022-09-16 DIAGNOSIS — I129 Hypertensive chronic kidney disease with stage 1 through stage 4 chronic kidney disease, or unspecified chronic kidney disease: Secondary | ICD-10-CM | POA: Diagnosis not present

## 2022-09-16 DIAGNOSIS — E1140 Type 2 diabetes mellitus with diabetic neuropathy, unspecified: Secondary | ICD-10-CM | POA: Diagnosis not present

## 2022-09-16 DIAGNOSIS — E1136 Type 2 diabetes mellitus with diabetic cataract: Secondary | ICD-10-CM | POA: Diagnosis not present

## 2022-09-16 DIAGNOSIS — I69354 Hemiplegia and hemiparesis following cerebral infarction affecting left non-dominant side: Secondary | ICD-10-CM | POA: Diagnosis not present

## 2022-09-16 DIAGNOSIS — F419 Anxiety disorder, unspecified: Secondary | ICD-10-CM | POA: Diagnosis not present

## 2022-09-16 DIAGNOSIS — N189 Chronic kidney disease, unspecified: Secondary | ICD-10-CM | POA: Diagnosis not present

## 2022-09-16 DIAGNOSIS — I2510 Atherosclerotic heart disease of native coronary artery without angina pectoris: Secondary | ICD-10-CM | POA: Diagnosis not present

## 2022-09-16 NOTE — Telephone Encounter (Signed)
Patient left VM about needing a foot brace sent to [facility]. Dr. Welton Flakes said she has ordered this but I do not see it in her chart or at my desk.

## 2022-09-17 DIAGNOSIS — F419 Anxiety disorder, unspecified: Secondary | ICD-10-CM | POA: Diagnosis not present

## 2022-09-17 DIAGNOSIS — I69354 Hemiplegia and hemiparesis following cerebral infarction affecting left non-dominant side: Secondary | ICD-10-CM | POA: Diagnosis not present

## 2022-09-17 DIAGNOSIS — E1140 Type 2 diabetes mellitus with diabetic neuropathy, unspecified: Secondary | ICD-10-CM | POA: Diagnosis not present

## 2022-09-17 DIAGNOSIS — I129 Hypertensive chronic kidney disease with stage 1 through stage 4 chronic kidney disease, or unspecified chronic kidney disease: Secondary | ICD-10-CM | POA: Diagnosis not present

## 2022-09-17 DIAGNOSIS — E1136 Type 2 diabetes mellitus with diabetic cataract: Secondary | ICD-10-CM | POA: Diagnosis not present

## 2022-09-17 DIAGNOSIS — E1122 Type 2 diabetes mellitus with diabetic chronic kidney disease: Secondary | ICD-10-CM | POA: Diagnosis not present

## 2022-09-17 DIAGNOSIS — N189 Chronic kidney disease, unspecified: Secondary | ICD-10-CM | POA: Diagnosis not present

## 2022-09-17 DIAGNOSIS — I2510 Atherosclerotic heart disease of native coronary artery without angina pectoris: Secondary | ICD-10-CM | POA: Diagnosis not present

## 2022-09-17 DIAGNOSIS — E782 Mixed hyperlipidemia: Secondary | ICD-10-CM | POA: Diagnosis not present

## 2022-09-22 ENCOUNTER — Telehealth: Payer: Self-pay | Admitting: Certified Nurse Midwife

## 2022-09-22 ENCOUNTER — Other Ambulatory Visit: Payer: Self-pay | Admitting: Internal Medicine

## 2022-09-22 DIAGNOSIS — E1165 Type 2 diabetes mellitus with hyperglycemia: Secondary | ICD-10-CM

## 2022-09-22 NOTE — Telephone Encounter (Signed)
Called pt to discuss ultrasound results. Discussed watchful waiting vs follow up with endometrial biopsy. Discussed pre procedural consultation with MD , given pt has mobility issues and had difficult time having pap smear completed due to significant pain and mobility ( difficulty getting into position) . Maybe necessary to have biopsy performed under sedation.   Pt state she will consider and call back if she would like to move forward with the biopsy.   Doreene Burke, CNM   Dr. Logan Bores was consulted on u/s results and follow up.

## 2022-09-24 DIAGNOSIS — I69354 Hemiplegia and hemiparesis following cerebral infarction affecting left non-dominant side: Secondary | ICD-10-CM | POA: Diagnosis not present

## 2022-09-24 DIAGNOSIS — I129 Hypertensive chronic kidney disease with stage 1 through stage 4 chronic kidney disease, or unspecified chronic kidney disease: Secondary | ICD-10-CM | POA: Diagnosis not present

## 2022-09-24 DIAGNOSIS — N189 Chronic kidney disease, unspecified: Secondary | ICD-10-CM | POA: Diagnosis not present

## 2022-09-24 DIAGNOSIS — E1136 Type 2 diabetes mellitus with diabetic cataract: Secondary | ICD-10-CM | POA: Diagnosis not present

## 2022-09-24 DIAGNOSIS — E1122 Type 2 diabetes mellitus with diabetic chronic kidney disease: Secondary | ICD-10-CM | POA: Diagnosis not present

## 2022-09-24 DIAGNOSIS — I2510 Atherosclerotic heart disease of native coronary artery without angina pectoris: Secondary | ICD-10-CM | POA: Diagnosis not present

## 2022-09-24 DIAGNOSIS — E782 Mixed hyperlipidemia: Secondary | ICD-10-CM | POA: Diagnosis not present

## 2022-09-24 DIAGNOSIS — E1140 Type 2 diabetes mellitus with diabetic neuropathy, unspecified: Secondary | ICD-10-CM | POA: Diagnosis not present

## 2022-09-24 DIAGNOSIS — F419 Anxiety disorder, unspecified: Secondary | ICD-10-CM | POA: Diagnosis not present

## 2022-09-25 DIAGNOSIS — E1136 Type 2 diabetes mellitus with diabetic cataract: Secondary | ICD-10-CM | POA: Diagnosis not present

## 2022-09-25 DIAGNOSIS — N189 Chronic kidney disease, unspecified: Secondary | ICD-10-CM | POA: Diagnosis not present

## 2022-09-25 DIAGNOSIS — F419 Anxiety disorder, unspecified: Secondary | ICD-10-CM | POA: Diagnosis not present

## 2022-09-25 DIAGNOSIS — I2510 Atherosclerotic heart disease of native coronary artery without angina pectoris: Secondary | ICD-10-CM | POA: Diagnosis not present

## 2022-09-25 DIAGNOSIS — E1140 Type 2 diabetes mellitus with diabetic neuropathy, unspecified: Secondary | ICD-10-CM | POA: Diagnosis not present

## 2022-09-25 DIAGNOSIS — E1122 Type 2 diabetes mellitus with diabetic chronic kidney disease: Secondary | ICD-10-CM | POA: Diagnosis not present

## 2022-09-25 DIAGNOSIS — E782 Mixed hyperlipidemia: Secondary | ICD-10-CM | POA: Diagnosis not present

## 2022-09-25 DIAGNOSIS — I69354 Hemiplegia and hemiparesis following cerebral infarction affecting left non-dominant side: Secondary | ICD-10-CM | POA: Diagnosis not present

## 2022-09-25 DIAGNOSIS — I129 Hypertensive chronic kidney disease with stage 1 through stage 4 chronic kidney disease, or unspecified chronic kidney disease: Secondary | ICD-10-CM | POA: Diagnosis not present

## 2022-09-29 DIAGNOSIS — E782 Mixed hyperlipidemia: Secondary | ICD-10-CM | POA: Diagnosis not present

## 2022-09-29 DIAGNOSIS — F419 Anxiety disorder, unspecified: Secondary | ICD-10-CM | POA: Diagnosis not present

## 2022-09-29 DIAGNOSIS — E1140 Type 2 diabetes mellitus with diabetic neuropathy, unspecified: Secondary | ICD-10-CM | POA: Diagnosis not present

## 2022-09-29 DIAGNOSIS — E1136 Type 2 diabetes mellitus with diabetic cataract: Secondary | ICD-10-CM | POA: Diagnosis not present

## 2022-09-29 DIAGNOSIS — I69354 Hemiplegia and hemiparesis following cerebral infarction affecting left non-dominant side: Secondary | ICD-10-CM | POA: Diagnosis not present

## 2022-09-29 DIAGNOSIS — I2510 Atherosclerotic heart disease of native coronary artery without angina pectoris: Secondary | ICD-10-CM | POA: Diagnosis not present

## 2022-09-29 DIAGNOSIS — E1122 Type 2 diabetes mellitus with diabetic chronic kidney disease: Secondary | ICD-10-CM | POA: Diagnosis not present

## 2022-09-29 DIAGNOSIS — N189 Chronic kidney disease, unspecified: Secondary | ICD-10-CM | POA: Diagnosis not present

## 2022-09-29 DIAGNOSIS — I129 Hypertensive chronic kidney disease with stage 1 through stage 4 chronic kidney disease, or unspecified chronic kidney disease: Secondary | ICD-10-CM | POA: Diagnosis not present

## 2022-10-02 DIAGNOSIS — N189 Chronic kidney disease, unspecified: Secondary | ICD-10-CM | POA: Diagnosis not present

## 2022-10-02 DIAGNOSIS — I69354 Hemiplegia and hemiparesis following cerebral infarction affecting left non-dominant side: Secondary | ICD-10-CM | POA: Diagnosis not present

## 2022-10-02 DIAGNOSIS — F419 Anxiety disorder, unspecified: Secondary | ICD-10-CM | POA: Diagnosis not present

## 2022-10-02 DIAGNOSIS — E1140 Type 2 diabetes mellitus with diabetic neuropathy, unspecified: Secondary | ICD-10-CM | POA: Diagnosis not present

## 2022-10-02 DIAGNOSIS — E1136 Type 2 diabetes mellitus with diabetic cataract: Secondary | ICD-10-CM | POA: Diagnosis not present

## 2022-10-02 DIAGNOSIS — E1122 Type 2 diabetes mellitus with diabetic chronic kidney disease: Secondary | ICD-10-CM | POA: Diagnosis not present

## 2022-10-02 DIAGNOSIS — I2510 Atherosclerotic heart disease of native coronary artery without angina pectoris: Secondary | ICD-10-CM | POA: Diagnosis not present

## 2022-10-02 DIAGNOSIS — E782 Mixed hyperlipidemia: Secondary | ICD-10-CM | POA: Diagnosis not present

## 2022-10-02 DIAGNOSIS — I129 Hypertensive chronic kidney disease with stage 1 through stage 4 chronic kidney disease, or unspecified chronic kidney disease: Secondary | ICD-10-CM | POA: Diagnosis not present

## 2022-10-05 ENCOUNTER — Telehealth: Payer: Self-pay

## 2022-10-06 ENCOUNTER — Ambulatory Visit: Payer: Medicare HMO | Admitting: Internal Medicine

## 2022-10-06 ENCOUNTER — Encounter: Payer: Self-pay | Admitting: Internal Medicine

## 2022-10-06 VITALS — BP 140/70 | HR 72 | Ht 61.0 in

## 2022-10-06 DIAGNOSIS — I69354 Hemiplegia and hemiparesis following cerebral infarction affecting left non-dominant side: Secondary | ICD-10-CM | POA: Diagnosis not present

## 2022-10-06 DIAGNOSIS — I25110 Atherosclerotic heart disease of native coronary artery with unstable angina pectoris: Secondary | ICD-10-CM | POA: Diagnosis not present

## 2022-10-06 DIAGNOSIS — E1140 Type 2 diabetes mellitus with diabetic neuropathy, unspecified: Secondary | ICD-10-CM | POA: Diagnosis not present

## 2022-10-06 DIAGNOSIS — F419 Anxiety disorder, unspecified: Secondary | ICD-10-CM | POA: Diagnosis not present

## 2022-10-06 DIAGNOSIS — E1165 Type 2 diabetes mellitus with hyperglycemia: Secondary | ICD-10-CM

## 2022-10-06 DIAGNOSIS — I2510 Atherosclerotic heart disease of native coronary artery without angina pectoris: Secondary | ICD-10-CM | POA: Diagnosis not present

## 2022-10-06 DIAGNOSIS — E782 Mixed hyperlipidemia: Secondary | ICD-10-CM | POA: Diagnosis not present

## 2022-10-06 DIAGNOSIS — R29898 Other symptoms and signs involving the musculoskeletal system: Secondary | ICD-10-CM | POA: Diagnosis not present

## 2022-10-06 DIAGNOSIS — I10 Essential (primary) hypertension: Secondary | ICD-10-CM

## 2022-10-06 DIAGNOSIS — N189 Chronic kidney disease, unspecified: Secondary | ICD-10-CM | POA: Diagnosis not present

## 2022-10-06 DIAGNOSIS — I129 Hypertensive chronic kidney disease with stage 1 through stage 4 chronic kidney disease, or unspecified chronic kidney disease: Secondary | ICD-10-CM | POA: Diagnosis not present

## 2022-10-06 DIAGNOSIS — E1136 Type 2 diabetes mellitus with diabetic cataract: Secondary | ICD-10-CM | POA: Diagnosis not present

## 2022-10-06 DIAGNOSIS — Z794 Long term (current) use of insulin: Secondary | ICD-10-CM | POA: Diagnosis not present

## 2022-10-06 DIAGNOSIS — E1122 Type 2 diabetes mellitus with diabetic chronic kidney disease: Secondary | ICD-10-CM | POA: Diagnosis not present

## 2022-10-06 LAB — POCT CBG (FASTING - GLUCOSE)-MANUAL ENTRY: Glucose Fasting, POC: 140 mg/dL — AB (ref 70–99)

## 2022-10-06 MED ORDER — SEMAGLUTIDE (1 MG/DOSE) 4 MG/3ML ~~LOC~~ SOPN: 1.0000 mg | PEN_INJECTOR | SUBCUTANEOUS | 5 refills | Status: DC

## 2022-10-06 NOTE — Progress Notes (Signed)
Established Patient Office Visit  Subjective:  Patient ID: Danielle Paul, female    DOB: 1955-09-28  Age: 67 y.o. MRN: [id number]  Chief Complaint  Patient presents with   Follow-up    6 week follow up    Patient comes in for follow-up today.  She is feeling better.  Seen by OB/GYN and treated for STI.  No further vaginal bleeding.  She is currently on Ozempic 0.5 mg and is tolerating it well.  Will increase the dose to 1 mg/week.  Needs fasting labs in 2 weeks.    No other concerns at this time.   Past Medical History:  Diagnosis Date   Anxiety    Coronary artery disease    CVA (cerebral vascular accident) (HCC) 01/2020   Diabetes mellitus without complication (HCC)    Hypercholesteremia    Hypertension    Left-sided weakness    S/P stroke    Past Surgical History:  Procedure Laterality Date   AORTIC VALVE REPLACEMENT (AVR)/CORONARY ARTERY BYPASS GRAFTING (CABG)  2009   CAROTID ANGIOGRAPHY N/A 03/21/2020   Procedure: CAROTID ANGIOGRAPHY;  Surgeon: Annice Needy, MD;  Location: [facility] INVASIVE CV LAB;  Service: Cardiovascular;  Laterality: N/A;    Social History   Socioeconomic History   Marital status: Unknown    Spouse name: Not on file   Number of children: Not on file   Years of education: Not on file   Highest education level: Not on file  Occupational History   Not on file  Tobacco Use   Smoking status: Never   Smokeless tobacco: Never  Vaping Use   Vaping status: Never Used  Substance and Sexual Activity   Alcohol use: Never   Drug use: Never   Sexual activity: Not on file  Other Topics Concern   Not on file  Social History Narrative   Not on file   Social Determinants of Health   Financial Resource Strain: Medium Risk (01/16/2019)   Received from [facility], [facility]   Overall Financial Resource Strain (CARDIA)    Difficulty of Paying Living Expenses: Somewhat hard  Food Insecurity: No Food Insecurity  (10/17/2021)   Hunger Vital Sign    Worried About Running Out of Food in the Last Year: Never true    Ran Out of Food in the Last Year: Never true  Transportation Needs: No Transportation Needs (10/17/2021)   PRAPARE - Administrator, Civil Service (Medical): No    Lack of Transportation (Non-Medical): No  Recent Concern: Transportation Needs - Unmet Transportation Needs (10/17/2021)   PRAPARE - Transportation    Lack of Transportation (Medical): Yes    Lack of Transportation (Non-Medical): Yes  Physical Activity: Sufficiently Active (01/16/2019)   Received from [facility], [facility]   Exercise Vital Sign    Days of Exercise per Week: 5 days    Minutes of Exercise per Session: 30 min  Stress: No Stress Concern Present (01/16/2019)   Received from [facility], [facility]   [employer] of Occupational Health - Occupational Stress Questionnaire    Feeling of Stress : Only a little  Social Connections: Moderately Integrated (01/16/2019)   Received from [facility], [facility]   Social Connection and Isolation Panel [NHANES]    Frequency of Communication with Friends and Family: More than three times a week    Frequency of Social Gatherings with Friends  and Family: Once a week    Attends Religious Services: More than 4 times per year    Active Member of Clubs or Organizations: No    Attends Banker Meetings: Never    Marital Status: Married  Catering manager Violence: Not on file    Family History  Problem Relation Age of Onset   Other Mother    Hypertension Mother    Hypertension Father     Allergies  Allergen Reactions   Atorvastatin Other (See Comments)    Muscle Pains   Isosorbide Nitrate Other (See Comments)    Jittery and lightheadedness.   Pravastatin Other (See Comments)   Simvastatin Other (See Comments)    Muscle Pains     Review of Systems  Constitutional: Negative.  Negative for chills, fever and malaise/fatigue.  HENT: Negative.  Negative for nosebleeds and sinus pain.   Eyes: Negative.   Respiratory: Negative.  Negative for cough and shortness of breath.   Cardiovascular: Negative.  Negative for chest pain, palpitations and leg swelling.  Gastrointestinal: Negative.  Negative for abdominal pain, constipation, diarrhea, heartburn, nausea and vomiting.  Genitourinary: Negative.  Negative for dysuria and flank pain.  Musculoskeletal: Negative.  Negative for joint pain and myalgias.  Skin: Negative.   Neurological:  Positive for weakness. Negative for dizziness and headaches.  Endo/Heme/Allergies: Negative.   Psychiatric/Behavioral: Negative.  Negative for depression and suicidal ideas. The patient is not nervous/anxious.        Objective:   BP (!) 140/70   Pulse 72   Ht 5\' 1"  (1.549 m)   SpO2 96%   BMI 31.18 kg/m   Vitals:   10/06/22 1325  BP: (!) 140/70  Pulse: 72  Height: 5\' 1"  (1.549 m)  SpO2: 96%    Physical Exam   Results for orders placed or performed in visit on 10/06/22  POCT CBG (Fasting - Glucose)  Result Value Ref Range   Glucose Fasting, POC 140 (A) 70 - 99 mg/dL        Assessment & Plan:  To continue all medications.  Increase dose of Ozempic to 1 mg/week, new prescription sent.  Patient will return in 2 weeks for fasting blood work. Problem List Items Addressed This Visit     Essential hypertension, benign   Relevant Orders   CMP14+EGFR   CAD (coronary artery disease), native coronary artery   Relevant Orders   CBC with Diff   Hyperlipidemia   Relevant Orders   Lipid Panel w/o Chol/HDL Ratio   Type 2 diabetes mellitus with hyperglycemia, with long-term current use of insulin (HCC) - Primary   Relevant Medications   Semaglutide, 1 MG/DOSE, 4 MG/3ML SOPN   Other Relevant Orders   POCT CBG (Fasting - Glucose) (Completed)   Hemoglobin A1c   Weakness of  both lower extremities    Return in about 2 months (around 12/06/2022).   Total time spent: 25 minutes  Margaretann Loveless, MD  10/06/2022   This document may have been prepared by [facility] Voice Recognition software and as such may include unintentional dictation errors.

## 2022-10-07 DIAGNOSIS — N189 Chronic kidney disease, unspecified: Secondary | ICD-10-CM | POA: Diagnosis not present

## 2022-10-07 DIAGNOSIS — E782 Mixed hyperlipidemia: Secondary | ICD-10-CM | POA: Diagnosis not present

## 2022-10-07 DIAGNOSIS — E1140 Type 2 diabetes mellitus with diabetic neuropathy, unspecified: Secondary | ICD-10-CM | POA: Diagnosis not present

## 2022-10-07 DIAGNOSIS — E1122 Type 2 diabetes mellitus with diabetic chronic kidney disease: Secondary | ICD-10-CM | POA: Diagnosis not present

## 2022-10-07 DIAGNOSIS — I129 Hypertensive chronic kidney disease with stage 1 through stage 4 chronic kidney disease, or unspecified chronic kidney disease: Secondary | ICD-10-CM | POA: Diagnosis not present

## 2022-10-07 DIAGNOSIS — I2510 Atherosclerotic heart disease of native coronary artery without angina pectoris: Secondary | ICD-10-CM | POA: Diagnosis not present

## 2022-10-07 DIAGNOSIS — F419 Anxiety disorder, unspecified: Secondary | ICD-10-CM | POA: Diagnosis not present

## 2022-10-07 DIAGNOSIS — E1136 Type 2 diabetes mellitus with diabetic cataract: Secondary | ICD-10-CM | POA: Diagnosis not present

## 2022-10-07 DIAGNOSIS — I69354 Hemiplegia and hemiparesis following cerebral infarction affecting left non-dominant side: Secondary | ICD-10-CM | POA: Diagnosis not present

## 2022-10-08 ENCOUNTER — Emergency Department: Payer: Medicare HMO

## 2022-10-08 ENCOUNTER — Other Ambulatory Visit: Payer: Self-pay

## 2022-10-08 ENCOUNTER — Emergency Department
Admission: EM | Admit: 2022-10-08 | Discharge: 2022-10-09 | Disposition: A | Discharge status: Home / Self Care | Payer: Medicare HMO | Attending: Emergency Medicine | Admitting: Emergency Medicine

## 2022-10-08 ENCOUNTER — Encounter: Payer: Self-pay | Admitting: Emergency Medicine

## 2022-10-08 DIAGNOSIS — W19XXXA Unspecified fall, initial encounter: Secondary | ICD-10-CM

## 2022-10-08 DIAGNOSIS — S99912A Unspecified injury of left ankle, initial encounter: Secondary | ICD-10-CM | POA: Diagnosis not present

## 2022-10-08 DIAGNOSIS — I7090 Unspecified atherosclerosis: Secondary | ICD-10-CM | POA: Diagnosis not present

## 2022-10-08 DIAGNOSIS — Y92002 Bathroom of unspecified non-institutional (private) residence single-family (private) house as the place of occurrence of the external cause: Secondary | ICD-10-CM | POA: Diagnosis not present

## 2022-10-08 DIAGNOSIS — I10 Essential (primary) hypertension: Secondary | ICD-10-CM | POA: Diagnosis not present

## 2022-10-08 DIAGNOSIS — W1839XA Other fall on same level, initial encounter: Secondary | ICD-10-CM | POA: Diagnosis not present

## 2022-10-08 DIAGNOSIS — S7002XA Contusion of left hip, initial encounter: Secondary | ICD-10-CM | POA: Diagnosis not present

## 2022-10-08 DIAGNOSIS — M25561 Pain in right knee: Secondary | ICD-10-CM | POA: Diagnosis not present

## 2022-10-08 DIAGNOSIS — M25562 Pain in left knee: Secondary | ICD-10-CM | POA: Diagnosis not present

## 2022-10-08 DIAGNOSIS — M25552 Pain in left hip: Secondary | ICD-10-CM | POA: Insufficient documentation

## 2022-10-08 DIAGNOSIS — T07XXXA Unspecified multiple injuries, initial encounter: Secondary | ICD-10-CM

## 2022-10-08 DIAGNOSIS — M25572 Pain in left ankle and joints of left foot: Secondary | ICD-10-CM | POA: Diagnosis not present

## 2022-10-08 DIAGNOSIS — S8992XA Unspecified injury of left lower leg, initial encounter: Secondary | ICD-10-CM | POA: Diagnosis present

## 2022-10-08 DIAGNOSIS — R600 Localized edema: Secondary | ICD-10-CM | POA: Diagnosis not present

## 2022-10-08 DIAGNOSIS — S8002XA Contusion of left knee, initial encounter: Secondary | ICD-10-CM | POA: Diagnosis not present

## 2022-10-08 DIAGNOSIS — S8012XA Contusion of left lower leg, initial encounter: Secondary | ICD-10-CM | POA: Insufficient documentation

## 2022-10-08 NOTE — ED Provider Notes (Incomplete)
[facility] Provider Note  Patient Contact: 11:28 PM (approximate)   History   Fall   HPI  Danielle Paul is a 67 y.o. female who presents the emergency department complaining of hip, knee and ankle pain.  Patient is primarily source of pain is the hip.  She states that she was transitioning from the couch to her wheelchair when she fell onto her right knee.  She did not hit her head or lose consciousness.  Denies any other complaints other than left lower extremity pain.  Again primary source of pain is left hip.     Physical Exam   Triage Vital Signs: ED Triage Vitals  Encounter Vitals Group     BP 10/08/22 1914 (!) 171/72     Systolic BP Percentile --      Diastolic BP Percentile --      Pulse Rate 10/08/22 1914 76     Resp 10/08/22 1914 17     Temp 10/08/22 1920 (!) 97.2 F (36.2 C)     Temp Source 10/08/22 1920 Axillary     SpO2 10/08/22 1914 98 %     Weight 10/08/22 1918 164 lb 14.5 oz (74.8 kg)     Height 10/08/22 1918 5\' 1"  (1.549 m)     Head Circumference --      Peak Flow --      Pain Score 10/08/22 1918 5     Pain Loc --      Pain Education --      Exclude from Growth Chart --     Most recent vital signs: Vitals:   10/08/22 1914 10/08/22 1920  BP: (!) 171/72   Pulse: 76   Resp: 17   Temp:  (!) 97.2 F (36.2 C)  SpO2: 98%      General: Alert and in no acute distress. Head: No acute traumatic findings  Neck: No stridor. No cervical spine tenderness to palpation.  Cardiovascular:  Good peripheral perfusion Respiratory: Normal respiratory effort without tachypnea or retractions. Lungs CTAB.  Musculoskeletal: Limited range of motion to the left lower extremity at this time.  Patient is tender to palpation diffusely along the left lower extremity but most tenderness is in the left anterior lateral hip.  No palpable abnormality.  No shortening or rotation of the left lower extremity.  Pulse and sensation intact distally. Neurologic:   No gross focal neurologic deficits are appreciated.  Skin:   No rash noted Other:   ED Results / Procedures / Treatments   Labs (all labs ordered are listed, but only abnormal results are displayed) Labs Reviewed - No data to display   EKG     RADIOLOGY  I personally viewed, evaluated, and interpreted these images as part of my medical decision making, as well as reviewing the written report by the radiologist.  ED Provider Interpretation: ***  DG Ankle Complete Left  Result Date: 10/08/2022 CLINICAL DATA:  Fall, pain, reduced range of motion. EXAM: LEFT ANKLE COMPLETE - 1 VIEW COMPARISON:  None Available. FINDINGS: The patient was only able to tolerate a single frontal view of the ankle, and terminated the exam after acquisition of the frontal projection. No visible fracture or malalignment on the frontal projection. Subcutaneous edema in the distal calf and ankle. IMPRESSION: 1. Limited exam, with only a single frontal projection obtained. No fracture or malalignment is identified. 2. Subcutaneous edema in the distal calf and ankle. Electronically Signed   By: Annitta Needs.D.  On: 10/08/2022 21:10   DG Hip Unilat With Pelvis 2-3 Views Left  Result Date: 10/08/2022 CLINICAL DATA:  Fall, hip pain, reduced range of motion EXAM: DG HIP (WITH OR WITHOUT PELVIS) 2-3V LEFT COMPARISON:  None Available. FINDINGS: The patient has limited ability to externally rotate to the left hip. Body habitus reduces diagnostic sensitivity and specificity, particularly on the cross-table lateral where the femoral neck and head are not visible. No well-defined cortical discontinuity to indicate fracture. Atheromatous vascular calcifications noted. IMPRESSION: 1. No well-defined cortical discontinuity to indicate fracture. Body habitus reduces diagnostic sensitivity and specificity, particularly on the cross-table lateral where the femoral neck and head are not visible. If there is high clinical  suspicion for fracture, consider CT or MRI. Electronically Signed   By: Gaylyn Rong M.D.   On: 10/08/2022 21:08   DG Knee Complete 4 Views Left  Result Date: 10/08/2022 CLINICAL DATA:  Fall, knee pain, reduced range of motion. EXAM: LEFT KNEE - COMPLETE 4+ VIEW COMPARISON:  None Available. FINDINGS: The patient was unable to assist with positioning for imaging, and discontinued the exam. As a result, imaging planes are suboptimal with the anterior-posterior view essentially the same as the oblique view, and the lateral projection off axis and overpenetrated with loss of visualization of some of the bony patella and tibia. The images are in adequate to assess for joint effusion. I do not see cortical discontinuity to suggest a fracture. There is some mild subcortical sclerosis in the medial tibial plateau probably degenerative and less likely from a subcortical stress fracture. Atheromatous vascular disease noted. Medial subcutaneous clips compatible with prior saphenous vein harvesting. IMPRESSION: 1. Suboptimal exam due to patient inability to cooperate with positioning, and patient termination of the exam in progress. 2. No definite fracture is identified. 3. Mild subcortical sclerosis in the medial tibial plateau is probably degenerative and less likely from a subcortical stress fracture. 4. Atheromatous vascular disease. Electronically Signed   By: Gaylyn Rong M.D.   On: 10/08/2022 21:06    PROCEDURES:  Critical Care performed: No  Procedures   MEDICATIONS ORDERED IN ED: Medications - No data to display   IMPRESSION / MDM / ASSESSMENT AND PLAN / ED COURSE  I reviewed the triage vital signs and the nursing notes.                                 Differential diagnosis includes, but is not limited to, ***  {**The patient is on the cardiac monitor to evaluate for evidence of arrhythmia and/or significant heart rate changes.**}  Patient's presentation is most consistent with  {EM COPA:27473}   Patient's diagnosis is consistent with ***. Patient will be discharged home with prescriptions for ***. Patient is to follow up with *** as needed or otherwise directed. Patient is given ED precautions to return to the ED for any worsening or new symptoms.     FINAL CLINICAL IMPRESSION(S) / ED DIAGNOSES   Final diagnoses:  None     Rx / DC Orders   ED Discharge Orders     None        Note:  This document was prepared using Dragon voice recognition software and may include unintentional dictation errors.

## 2022-10-08 NOTE — ED Provider Notes (Signed)
[facility] Provider Note  Patient Contact: 11:28 PM (approximate)   History   Fall   HPI  Danielle Paul is a 67 y.o. female who presents the emergency department complaining of hip, knee and ankle pain.  Patient is primarily source of pain is the hip.  She states that she was transitioning from the couch to her wheelchair when she fell onto her right knee.  She did not hit her head or lose consciousness.  Denies any other complaints other than left lower extremity pain.  Again primary source of pain is left hip.     Physical Exam   Triage Vital Signs: ED Triage Vitals  Encounter Vitals Group     BP 10/08/22 1914 (!) 171/72     Systolic BP Percentile --      Diastolic BP Percentile --      Pulse Rate 10/08/22 1914 76     Resp 10/08/22 1914 17     Temp 10/08/22 1920 (!) 97.2 F (36.2 C)     Temp Source 10/08/22 1920 Axillary     SpO2 10/08/22 1914 98 %     Weight 10/08/22 1918 164 lb 14.5 oz (74.8 kg)     Height 10/08/22 1918 5\' 1"  (1.549 m)     Head Circumference --      Peak Flow --      Pain Score 10/08/22 1918 5     Pain Loc --      Pain Education --      Exclude from Growth Chart --     Most recent vital signs: Vitals:   10/08/22 1914 10/08/22 1920  BP: (!) 171/72   Pulse: 76   Resp: 17   Temp:  (!) 97.2 F (36.2 C)  SpO2: 98%      General: Alert and in no acute distress. Head: No acute traumatic findings  Neck: No stridor. No cervical spine tenderness to palpation.  Cardiovascular:  Good peripheral perfusion Respiratory: Normal respiratory effort without tachypnea or retractions. Lungs CTAB.  Musculoskeletal: Limited range of motion to the left lower extremity at this time.  Patient is tender to palpation diffusely along the left lower extremity but most tenderness is in the left anterior lateral hip.  No palpable abnormality.  No shortening or rotation of the left lower extremity.  Pulse and sensation intact distally. Neurologic:   No gross focal neurologic deficits are appreciated.  Skin:   No rash noted Other:   ED Results / Procedures / Treatments   Labs (all labs ordered are listed, but only abnormal results are displayed) Labs Reviewed - No data to display   EKG     RADIOLOGY  I personally viewed, evaluated, and interpreted these images as part of my medical decision making, as well as reviewing the written report by the radiologist.  ED Provider Interpretation: ***  CT Hip Left Wo Contrast  Result Date: 10/09/2022 CLINICAL DATA:  Recent fall with hip pain, initial encounter EXAM: CT OF THE LEFT HIP WITHOUT CONTRAST TECHNIQUE: Multidetector CT imaging of the left hip was performed according to the standard protocol. Multiplanar CT image reconstructions were also generated. RADIATION DOSE REDUCTION: This exam was performed according to the departmental dose-optimization program which includes automated exposure control, adjustment of the mA and/or kV according to patient size and/or use of iterative reconstruction technique. COMPARISON:  Plain film from earlier in the same day. FINDINGS: Bones/Joint/Cartilage No findings to suggest acute fracture or dislocation in the left hip  are noted. No significant joint effusion is noted. Ligaments Suboptimally assessed by CT. Muscles and Tendons Surrounding musculature appears within normal limits. Soft tissues Surrounding soft tissue structures are within normal limits. IMPRESSION: No evidence of acute hip fracture on the left. Electronically Signed   By: Alcide Clever M.D.   On: 10/09/2022 00:00   DG Ankle Complete Left  Result Date: 10/08/2022 CLINICAL DATA:  Fall, pain, reduced range of motion. EXAM: LEFT ANKLE COMPLETE - 1 VIEW COMPARISON:  None Available. FINDINGS: The patient was only able to tolerate a single frontal view of the ankle, and terminated the exam after acquisition of the frontal projection. No visible fracture or malalignment on the frontal  projection. Subcutaneous edema in the distal calf and ankle. IMPRESSION: 1. Limited exam, with only a single frontal projection obtained. No fracture or malalignment is identified. 2. Subcutaneous edema in the distal calf and ankle. Electronically Signed   By: Gaylyn Rong M.D.   On: 10/08/2022 21:10   DG Hip Unilat With Pelvis 2-3 Views Left  Result Date: 10/08/2022 CLINICAL DATA:  Fall, hip pain, reduced range of motion EXAM: DG HIP (WITH OR WITHOUT PELVIS) 2-3V LEFT COMPARISON:  None Available. FINDINGS: The patient has limited ability to externally rotate to the left hip. Body habitus reduces diagnostic sensitivity and specificity, particularly on the cross-table lateral where the femoral neck and head are not visible. No well-defined cortical discontinuity to indicate fracture. Atheromatous vascular calcifications noted. IMPRESSION: 1. No well-defined cortical discontinuity to indicate fracture. Body habitus reduces diagnostic sensitivity and specificity, particularly on the cross-table lateral where the femoral neck and head are not visible. If there is high clinical suspicion for fracture, consider CT or MRI. Electronically Signed   By: Gaylyn Rong M.D.   On: 10/08/2022 21:08   DG Knee Complete 4 Views Left  Result Date: 10/08/2022 CLINICAL DATA:  Fall, knee pain, reduced range of motion. EXAM: LEFT KNEE - COMPLETE 4+ VIEW COMPARISON:  None Available. FINDINGS: The patient was unable to assist with positioning for imaging, and discontinued the exam. As a result, imaging planes are suboptimal with the anterior-posterior view essentially the same as the oblique view, and the lateral projection off axis and overpenetrated with loss of visualization of some of the bony patella and tibia. The images are in adequate to assess for joint effusion. I do not see cortical discontinuity to suggest a fracture. There is some mild subcortical sclerosis in the medial tibial plateau probably degenerative  and less likely from a subcortical stress fracture. Atheromatous vascular disease noted. Medial subcutaneous clips compatible with prior saphenous vein harvesting. IMPRESSION: 1. Suboptimal exam due to patient inability to cooperate with positioning, and patient termination of the exam in progress. 2. No definite fracture is identified. 3. Mild subcortical sclerosis in the medial tibial plateau is probably degenerative and less likely from a subcortical stress fracture. 4. Atheromatous vascular disease. Electronically Signed   By: Gaylyn Rong M.D.   On: 10/08/2022 21:06    PROCEDURES:  Critical Care performed: No  Procedures   MEDICATIONS ORDERED IN ED: Medications  predniSONE (DELTASONE) tablet 60 mg (has no administration in time range)  HYDROcodone-acetaminophen (NORCO/VICODIN) 5-325 MG per tablet 1 tablet (has no administration in time range)     IMPRESSION / MDM / ASSESSMENT AND PLAN / ED COURSE  I reviewed the triage vital signs and the nursing notes.  Differential diagnosis includes, but is not limited to, impression, knee, femur fracture, ankle fracture, contusions   Patient's presentation is most consistent with acute presentation with potential threat to life or bodily function.   Patient's diagnosis is consistent with fall, contusions of the left leg.  Patient presents the emergency department after having a mechanical fall.  She landed on the left hip and leg.  Primary complaint was left hip pain.  Patient is relatively nonambulatory at baseline, was complaining of increased pain.  Given the pain that she did not allow positioning for x-rays to occur.  Given this radiologist was unable to fully ascertain fracture specifically about the neck.  As such CT was ordered without acute evidence of fracture.  This time patient will have symptom control medication at home.  Patient is stable for discharge.  Follow-up primary care as needed..   Patient is given ED precautions to return to the ED for any worsening or new symptoms.     FINAL CLINICAL IMPRESSION(S) / ED DIAGNOSES   Final diagnoses:  Fall, initial encounter  Multiple contusions     Rx / DC Orders   ED Discharge Orders          Ordered    HYDROcodone-acetaminophen (NORCO/VICODIN) 5-325 MG tablet  Every 4 hours PRN        10/09/22 0007    predniSONE (DELTASONE) 50 MG tablet  Daily with breakfast        10/09/22 0007             Note:  This document was prepared using Dragon voice recognition software and may include unintentional dictation errors.   Lanette Hampshire 10/09/22 0009    Chesley Noon, MD 10/09/22 [phone]

## 2022-10-08 NOTE — ED Notes (Signed)
Pt removed from bedpan. Pt unable to urinate. Clean brief and chux placed. Old linens removed from behind pt.  Patient transported to X-ray by [employer]

## 2022-10-08 NOTE — ED Notes (Signed)
Pt placed on bedpan with this EDT and [place] NT.

## 2022-10-08 NOTE — ED Triage Notes (Addendum)
Pt arrived via [facility] from home with family, reports pt had a fall, c/o L hip, L knee and L ankle pain.  Pt uses a wheelchair at home.   Pt states she was getting up to use the bathroom and fell, states the foot of the wheelchair was in her back, pt fell onto L knee, pt c/o L hip and L ankle pain also.  Pt alert and oriented at this time.

## 2022-10-08 NOTE — ED Notes (Signed)
Per Xray- pt was not able to tolerate completing the knee or ankle xray due to pain.

## 2022-10-09 MED ORDER — PREDNISONE 50 MG PO TABS: 50.0000 mg | ORAL_TABLET | Freq: Every day | ORAL | 0 refills | Status: DC

## 2022-10-09 MED ORDER — HYDROCODONE-ACETAMINOPHEN 5-325 MG PO TABS
1.0000 | ORAL_TABLET | Freq: Once | ORAL | Status: AC
  Administered 2022-10-09: 1 via ORAL
  Filled 2022-10-09: qty 1

## 2022-10-09 MED ORDER — HYDROCODONE-ACETAMINOPHEN 5-325 MG PO TABS: 1.0000 | ORAL_TABLET | ORAL | 0 refills | Status: DC | PRN

## 2022-10-09 MED ORDER — PREDNISONE 20 MG PO TABS
60.0000 mg | ORAL_TABLET | Freq: Once | ORAL | Status: AC
  Administered 2022-10-09: 60 mg via ORAL
  Filled 2022-10-09: qty 3

## 2022-10-09 NOTE — ED Notes (Signed)
Call bell answered. Bed pan removed. Full linen change performed and pt was cleaned with incontinent cleanser wipes. Clean chux and brief placed on pt. Pt repositioned in bed with help of Ila Mcgill, and Gerlene Burdock, Charity fundraiser. Pt denied any further needs at this time.

## 2022-10-09 NOTE — ED Notes (Signed)
Continued unsuccessful attempts at contacting pts family for discharge/transport need. Pt unable to travel by EMS due to inability to contact anyone at home to ensure door can be unlocked and that someone is present for pt arrival.

## 2022-10-09 NOTE — ED Notes (Addendum)
Unsuccessful attempt to contact family to update and inform of pts discharge and need for transport home. Numerous calls made to son, spouse and daughter-in-law with no answer or ability to leave message.

## 2022-10-09 NOTE — ED Notes (Signed)
Call bell answered. Pt informed EDT that she needed to void. Bed pan placed and call bell still within reach. Pt informed to press call bell again when she had finished voiding.

## 2022-10-12 DIAGNOSIS — E1140 Type 2 diabetes mellitus with diabetic neuropathy, unspecified: Secondary | ICD-10-CM | POA: Diagnosis not present

## 2022-10-12 DIAGNOSIS — I2510 Atherosclerotic heart disease of native coronary artery without angina pectoris: Secondary | ICD-10-CM | POA: Diagnosis not present

## 2022-10-12 DIAGNOSIS — E782 Mixed hyperlipidemia: Secondary | ICD-10-CM | POA: Diagnosis not present

## 2022-10-12 DIAGNOSIS — N189 Chronic kidney disease, unspecified: Secondary | ICD-10-CM | POA: Diagnosis not present

## 2022-10-12 DIAGNOSIS — F419 Anxiety disorder, unspecified: Secondary | ICD-10-CM | POA: Diagnosis not present

## 2022-10-12 DIAGNOSIS — I69354 Hemiplegia and hemiparesis following cerebral infarction affecting left non-dominant side: Secondary | ICD-10-CM | POA: Diagnosis not present

## 2022-10-12 DIAGNOSIS — E1122 Type 2 diabetes mellitus with diabetic chronic kidney disease: Secondary | ICD-10-CM | POA: Diagnosis not present

## 2022-10-12 DIAGNOSIS — E1136 Type 2 diabetes mellitus with diabetic cataract: Secondary | ICD-10-CM | POA: Diagnosis not present

## 2022-10-12 DIAGNOSIS — I129 Hypertensive chronic kidney disease with stage 1 through stage 4 chronic kidney disease, or unspecified chronic kidney disease: Secondary | ICD-10-CM | POA: Diagnosis not present

## 2022-10-14 ENCOUNTER — Telehealth: Payer: Self-pay | Admitting: Internal Medicine

## 2022-10-14 DIAGNOSIS — N189 Chronic kidney disease, unspecified: Secondary | ICD-10-CM | POA: Diagnosis not present

## 2022-10-14 DIAGNOSIS — I2510 Atherosclerotic heart disease of native coronary artery without angina pectoris: Secondary | ICD-10-CM | POA: Diagnosis not present

## 2022-10-14 DIAGNOSIS — E1140 Type 2 diabetes mellitus with diabetic neuropathy, unspecified: Secondary | ICD-10-CM | POA: Diagnosis not present

## 2022-10-14 DIAGNOSIS — I129 Hypertensive chronic kidney disease with stage 1 through stage 4 chronic kidney disease, or unspecified chronic kidney disease: Secondary | ICD-10-CM | POA: Diagnosis not present

## 2022-10-14 DIAGNOSIS — I69354 Hemiplegia and hemiparesis following cerebral infarction affecting left non-dominant side: Secondary | ICD-10-CM | POA: Diagnosis not present

## 2022-10-14 DIAGNOSIS — E1136 Type 2 diabetes mellitus with diabetic cataract: Secondary | ICD-10-CM | POA: Diagnosis not present

## 2022-10-14 DIAGNOSIS — E1122 Type 2 diabetes mellitus with diabetic chronic kidney disease: Secondary | ICD-10-CM | POA: Diagnosis not present

## 2022-10-14 DIAGNOSIS — E782 Mixed hyperlipidemia: Secondary | ICD-10-CM | POA: Diagnosis not present

## 2022-10-14 DIAGNOSIS — F419 Anxiety disorder, unspecified: Secondary | ICD-10-CM | POA: Diagnosis not present

## 2022-10-14 NOTE — Telephone Encounter (Signed)
Daily, PT with [facility], left a VM that the patient had a fall on 8/15 that resulted in an ED visit where they told her that she has a left hip contusion. Just FYI.

## 2022-10-15 ENCOUNTER — Telehealth: Payer: Self-pay

## 2022-10-15 DIAGNOSIS — N189 Chronic kidney disease, unspecified: Secondary | ICD-10-CM | POA: Diagnosis not present

## 2022-10-15 DIAGNOSIS — E1122 Type 2 diabetes mellitus with diabetic chronic kidney disease: Secondary | ICD-10-CM | POA: Diagnosis not present

## 2022-10-15 DIAGNOSIS — F419 Anxiety disorder, unspecified: Secondary | ICD-10-CM | POA: Diagnosis not present

## 2022-10-15 DIAGNOSIS — E1136 Type 2 diabetes mellitus with diabetic cataract: Secondary | ICD-10-CM | POA: Diagnosis not present

## 2022-10-15 DIAGNOSIS — I129 Hypertensive chronic kidney disease with stage 1 through stage 4 chronic kidney disease, or unspecified chronic kidney disease: Secondary | ICD-10-CM | POA: Diagnosis not present

## 2022-10-15 DIAGNOSIS — E782 Mixed hyperlipidemia: Secondary | ICD-10-CM | POA: Diagnosis not present

## 2022-10-15 DIAGNOSIS — I2510 Atherosclerotic heart disease of native coronary artery without angina pectoris: Secondary | ICD-10-CM | POA: Diagnosis not present

## 2022-10-15 DIAGNOSIS — I69354 Hemiplegia and hemiparesis following cerebral infarction affecting left non-dominant side: Secondary | ICD-10-CM | POA: Diagnosis not present

## 2022-10-15 DIAGNOSIS — E1140 Type 2 diabetes mellitus with diabetic neuropathy, unspecified: Secondary | ICD-10-CM | POA: Diagnosis not present

## 2022-10-15 NOTE — Telephone Encounter (Signed)
Transition Care Management Unsuccessful Follow-up Telephone Call  Date of discharge and from where:  [place] 8/16  Attempts:  1st Attempt  Reason for unsuccessful TCM follow-up call:  No answer/busy

## 2022-10-16 ENCOUNTER — Telehealth: Payer: Self-pay

## 2022-10-16 NOTE — Telephone Encounter (Signed)
Transition Care Management Unsuccessful Follow-up Telephone Call  Date of discharge and from where:  [place] 8/16  Attempts:  2nd  Reason for unsuccessful TCM follow-up call:  No answer/busy   [facility]  [facility], [facility] Guide, Phone: [phone] Website: [email or web address]

## 2022-10-22 DIAGNOSIS — E782 Mixed hyperlipidemia: Secondary | ICD-10-CM | POA: Diagnosis not present

## 2022-10-22 DIAGNOSIS — N189 Chronic kidney disease, unspecified: Secondary | ICD-10-CM | POA: Diagnosis not present

## 2022-10-22 DIAGNOSIS — I129 Hypertensive chronic kidney disease with stage 1 through stage 4 chronic kidney disease, or unspecified chronic kidney disease: Secondary | ICD-10-CM | POA: Diagnosis not present

## 2022-10-22 DIAGNOSIS — F419 Anxiety disorder, unspecified: Secondary | ICD-10-CM | POA: Diagnosis not present

## 2022-10-22 DIAGNOSIS — I69354 Hemiplegia and hemiparesis following cerebral infarction affecting left non-dominant side: Secondary | ICD-10-CM | POA: Diagnosis not present

## 2022-10-22 DIAGNOSIS — E1140 Type 2 diabetes mellitus with diabetic neuropathy, unspecified: Secondary | ICD-10-CM | POA: Diagnosis not present

## 2022-10-22 DIAGNOSIS — E1136 Type 2 diabetes mellitus with diabetic cataract: Secondary | ICD-10-CM | POA: Diagnosis not present

## 2022-10-22 DIAGNOSIS — I2510 Atherosclerotic heart disease of native coronary artery without angina pectoris: Secondary | ICD-10-CM | POA: Diagnosis not present

## 2022-10-22 DIAGNOSIS — E1122 Type 2 diabetes mellitus with diabetic chronic kidney disease: Secondary | ICD-10-CM | POA: Diagnosis not present

## 2022-10-23 DIAGNOSIS — I129 Hypertensive chronic kidney disease with stage 1 through stage 4 chronic kidney disease, or unspecified chronic kidney disease: Secondary | ICD-10-CM | POA: Diagnosis not present

## 2022-10-23 DIAGNOSIS — I2510 Atherosclerotic heart disease of native coronary artery without angina pectoris: Secondary | ICD-10-CM | POA: Diagnosis not present

## 2022-10-23 DIAGNOSIS — F419 Anxiety disorder, unspecified: Secondary | ICD-10-CM | POA: Diagnosis not present

## 2022-10-23 DIAGNOSIS — E1140 Type 2 diabetes mellitus with diabetic neuropathy, unspecified: Secondary | ICD-10-CM | POA: Diagnosis not present

## 2022-10-23 DIAGNOSIS — E1122 Type 2 diabetes mellitus with diabetic chronic kidney disease: Secondary | ICD-10-CM | POA: Diagnosis not present

## 2022-10-23 DIAGNOSIS — N189 Chronic kidney disease, unspecified: Secondary | ICD-10-CM | POA: Diagnosis not present

## 2022-10-23 DIAGNOSIS — E1136 Type 2 diabetes mellitus with diabetic cataract: Secondary | ICD-10-CM | POA: Diagnosis not present

## 2022-10-23 DIAGNOSIS — I69354 Hemiplegia and hemiparesis following cerebral infarction affecting left non-dominant side: Secondary | ICD-10-CM | POA: Diagnosis not present

## 2022-10-23 DIAGNOSIS — E782 Mixed hyperlipidemia: Secondary | ICD-10-CM | POA: Diagnosis not present

## 2022-10-27 DIAGNOSIS — I2510 Atherosclerotic heart disease of native coronary artery without angina pectoris: Secondary | ICD-10-CM | POA: Diagnosis not present

## 2022-10-27 DIAGNOSIS — N189 Chronic kidney disease, unspecified: Secondary | ICD-10-CM | POA: Diagnosis not present

## 2022-10-27 DIAGNOSIS — E1122 Type 2 diabetes mellitus with diabetic chronic kidney disease: Secondary | ICD-10-CM | POA: Diagnosis not present

## 2022-10-27 DIAGNOSIS — E1140 Type 2 diabetes mellitus with diabetic neuropathy, unspecified: Secondary | ICD-10-CM | POA: Diagnosis not present

## 2022-10-27 DIAGNOSIS — E1136 Type 2 diabetes mellitus with diabetic cataract: Secondary | ICD-10-CM | POA: Diagnosis not present

## 2022-10-27 DIAGNOSIS — I69354 Hemiplegia and hemiparesis following cerebral infarction affecting left non-dominant side: Secondary | ICD-10-CM | POA: Diagnosis not present

## 2022-10-27 DIAGNOSIS — E782 Mixed hyperlipidemia: Secondary | ICD-10-CM | POA: Diagnosis not present

## 2022-10-27 DIAGNOSIS — I129 Hypertensive chronic kidney disease with stage 1 through stage 4 chronic kidney disease, or unspecified chronic kidney disease: Secondary | ICD-10-CM | POA: Diagnosis not present

## 2022-10-27 DIAGNOSIS — F419 Anxiety disorder, unspecified: Secondary | ICD-10-CM | POA: Diagnosis not present

## 2022-10-29 DIAGNOSIS — N189 Chronic kidney disease, unspecified: Secondary | ICD-10-CM | POA: Diagnosis not present

## 2022-10-29 DIAGNOSIS — I2510 Atherosclerotic heart disease of native coronary artery without angina pectoris: Secondary | ICD-10-CM | POA: Diagnosis not present

## 2022-10-29 DIAGNOSIS — E1140 Type 2 diabetes mellitus with diabetic neuropathy, unspecified: Secondary | ICD-10-CM | POA: Diagnosis not present

## 2022-10-29 DIAGNOSIS — E1136 Type 2 diabetes mellitus with diabetic cataract: Secondary | ICD-10-CM | POA: Diagnosis not present

## 2022-10-29 DIAGNOSIS — E782 Mixed hyperlipidemia: Secondary | ICD-10-CM | POA: Diagnosis not present

## 2022-10-29 DIAGNOSIS — I69354 Hemiplegia and hemiparesis following cerebral infarction affecting left non-dominant side: Secondary | ICD-10-CM | POA: Diagnosis not present

## 2022-10-29 DIAGNOSIS — E1122 Type 2 diabetes mellitus with diabetic chronic kidney disease: Secondary | ICD-10-CM | POA: Diagnosis not present

## 2022-10-29 DIAGNOSIS — F419 Anxiety disorder, unspecified: Secondary | ICD-10-CM | POA: Diagnosis not present

## 2022-10-29 DIAGNOSIS — I129 Hypertensive chronic kidney disease with stage 1 through stage 4 chronic kidney disease, or unspecified chronic kidney disease: Secondary | ICD-10-CM | POA: Diagnosis not present

## 2022-11-05 ENCOUNTER — Telehealth: Payer: Self-pay

## 2022-11-05 DIAGNOSIS — E782 Mixed hyperlipidemia: Secondary | ICD-10-CM | POA: Diagnosis not present

## 2022-11-05 DIAGNOSIS — N189 Chronic kidney disease, unspecified: Secondary | ICD-10-CM | POA: Diagnosis not present

## 2022-11-05 DIAGNOSIS — I69354 Hemiplegia and hemiparesis following cerebral infarction affecting left non-dominant side: Secondary | ICD-10-CM | POA: Diagnosis not present

## 2022-11-05 DIAGNOSIS — I129 Hypertensive chronic kidney disease with stage 1 through stage 4 chronic kidney disease, or unspecified chronic kidney disease: Secondary | ICD-10-CM | POA: Diagnosis not present

## 2022-11-05 DIAGNOSIS — E1136 Type 2 diabetes mellitus with diabetic cataract: Secondary | ICD-10-CM | POA: Diagnosis not present

## 2022-11-05 DIAGNOSIS — F419 Anxiety disorder, unspecified: Secondary | ICD-10-CM | POA: Diagnosis not present

## 2022-11-05 DIAGNOSIS — E1140 Type 2 diabetes mellitus with diabetic neuropathy, unspecified: Secondary | ICD-10-CM | POA: Diagnosis not present

## 2022-11-05 DIAGNOSIS — E1122 Type 2 diabetes mellitus with diabetic chronic kidney disease: Secondary | ICD-10-CM | POA: Diagnosis not present

## 2022-11-05 DIAGNOSIS — I2510 Atherosclerotic heart disease of native coronary artery without angina pectoris: Secondary | ICD-10-CM | POA: Diagnosis not present

## 2022-11-06 ENCOUNTER — Telehealth: Payer: Self-pay | Admitting: Internal Medicine

## 2022-11-06 DIAGNOSIS — N189 Chronic kidney disease, unspecified: Secondary | ICD-10-CM | POA: Diagnosis not present

## 2022-11-06 DIAGNOSIS — E1140 Type 2 diabetes mellitus with diabetic neuropathy, unspecified: Secondary | ICD-10-CM | POA: Diagnosis not present

## 2022-11-06 DIAGNOSIS — I69354 Hemiplegia and hemiparesis following cerebral infarction affecting left non-dominant side: Secondary | ICD-10-CM | POA: Diagnosis not present

## 2022-11-06 DIAGNOSIS — I2510 Atherosclerotic heart disease of native coronary artery without angina pectoris: Secondary | ICD-10-CM | POA: Diagnosis not present

## 2022-11-06 DIAGNOSIS — E1136 Type 2 diabetes mellitus with diabetic cataract: Secondary | ICD-10-CM | POA: Diagnosis not present

## 2022-11-06 DIAGNOSIS — I129 Hypertensive chronic kidney disease with stage 1 through stage 4 chronic kidney disease, or unspecified chronic kidney disease: Secondary | ICD-10-CM | POA: Diagnosis not present

## 2022-11-06 DIAGNOSIS — E1122 Type 2 diabetes mellitus with diabetic chronic kidney disease: Secondary | ICD-10-CM | POA: Diagnosis not present

## 2022-11-06 DIAGNOSIS — F419 Anxiety disorder, unspecified: Secondary | ICD-10-CM | POA: Diagnosis not present

## 2022-11-06 DIAGNOSIS — E782 Mixed hyperlipidemia: Secondary | ICD-10-CM | POA: Diagnosis not present

## 2022-11-06 NOTE — Telephone Encounter (Signed)
Junious Dresser with [facility] called requesting verbal orders for patient to extend [facility] OT and aide.  HH aide once a week for 8 weeks. OT once a week for 3 weeks, then once every other week for 6 weeks.  Verbal okay given to [place].

## 2022-11-09 ENCOUNTER — Other Ambulatory Visit: Payer: Self-pay | Admitting: Internal Medicine

## 2022-11-09 DIAGNOSIS — M1711 Unilateral primary osteoarthritis, right knee: Secondary | ICD-10-CM

## 2022-11-10 DIAGNOSIS — F419 Anxiety disorder, unspecified: Secondary | ICD-10-CM | POA: Diagnosis not present

## 2022-11-10 DIAGNOSIS — I129 Hypertensive chronic kidney disease with stage 1 through stage 4 chronic kidney disease, or unspecified chronic kidney disease: Secondary | ICD-10-CM | POA: Diagnosis not present

## 2022-11-10 DIAGNOSIS — N189 Chronic kidney disease, unspecified: Secondary | ICD-10-CM | POA: Diagnosis not present

## 2022-11-10 DIAGNOSIS — E782 Mixed hyperlipidemia: Secondary | ICD-10-CM | POA: Diagnosis not present

## 2022-11-10 DIAGNOSIS — I69354 Hemiplegia and hemiparesis following cerebral infarction affecting left non-dominant side: Secondary | ICD-10-CM | POA: Diagnosis not present

## 2022-11-10 DIAGNOSIS — E1122 Type 2 diabetes mellitus with diabetic chronic kidney disease: Secondary | ICD-10-CM | POA: Diagnosis not present

## 2022-11-10 DIAGNOSIS — E1136 Type 2 diabetes mellitus with diabetic cataract: Secondary | ICD-10-CM | POA: Diagnosis not present

## 2022-11-10 DIAGNOSIS — I2510 Atherosclerotic heart disease of native coronary artery without angina pectoris: Secondary | ICD-10-CM | POA: Diagnosis not present

## 2022-11-10 DIAGNOSIS — E1140 Type 2 diabetes mellitus with diabetic neuropathy, unspecified: Secondary | ICD-10-CM | POA: Diagnosis not present

## 2022-11-17 DIAGNOSIS — M21372 Foot drop, left foot: Secondary | ICD-10-CM | POA: Diagnosis not present

## 2022-11-17 NOTE — Telephone Encounter (Signed)
Order sign by PCP.

## 2022-12-07 ENCOUNTER — Ambulatory Visit: Payer: Medicare HMO | Admitting: Internal Medicine

## 2022-12-07 ENCOUNTER — Encounter: Payer: Self-pay | Admitting: Internal Medicine

## 2022-12-07 VITALS — BP 142/70 | HR 59 | Ht 61.0 in

## 2022-12-07 DIAGNOSIS — E559 Vitamin D deficiency, unspecified: Secondary | ICD-10-CM | POA: Diagnosis not present

## 2022-12-07 DIAGNOSIS — E669 Obesity, unspecified: Secondary | ICD-10-CM | POA: Diagnosis not present

## 2022-12-07 DIAGNOSIS — I10 Essential (primary) hypertension: Secondary | ICD-10-CM

## 2022-12-07 DIAGNOSIS — Z1231 Encounter for screening mammogram for malignant neoplasm of breast: Secondary | ICD-10-CM | POA: Diagnosis not present

## 2022-12-07 DIAGNOSIS — E1159 Type 2 diabetes mellitus with other circulatory complications: Secondary | ICD-10-CM

## 2022-12-07 DIAGNOSIS — Z794 Long term (current) use of insulin: Secondary | ICD-10-CM | POA: Diagnosis not present

## 2022-12-07 DIAGNOSIS — E1165 Type 2 diabetes mellitus with hyperglycemia: Secondary | ICD-10-CM | POA: Diagnosis not present

## 2022-12-07 DIAGNOSIS — Z23 Encounter for immunization: Secondary | ICD-10-CM

## 2022-12-07 DIAGNOSIS — I152 Hypertension secondary to endocrine disorders: Secondary | ICD-10-CM | POA: Diagnosis not present

## 2022-12-07 DIAGNOSIS — R29898 Other symptoms and signs involving the musculoskeletal system: Secondary | ICD-10-CM

## 2022-12-07 DIAGNOSIS — E782 Mixed hyperlipidemia: Secondary | ICD-10-CM | POA: Diagnosis not present

## 2022-12-07 DIAGNOSIS — I25718 Atherosclerosis of autologous vein coronary artery bypass graft(s) with other forms of angina pectoris: Secondary | ICD-10-CM

## 2022-12-07 DIAGNOSIS — E1169 Type 2 diabetes mellitus with other specified complication: Secondary | ICD-10-CM | POA: Diagnosis not present

## 2022-12-07 LAB — POCT CBG (FASTING - GLUCOSE)-MANUAL ENTRY: Glucose Fasting, POC: 134 mg/dL — AB (ref 70–99)

## 2022-12-07 NOTE — Progress Notes (Signed)
Established Patient Office Visit  Subjective:  Patient ID: Danielle Paul, female    DOB: 1956-02-11  Age: 67 y.o. MRN: [id number]  Chief Complaint  Patient presents with   Follow-up    2 month follow up    Patient is here for her follow-up today.  She is generally feeling well and tolerating all medications.  She just completed her physical therapy.  Needs lab work today. Also to schedule a mammogram.    No other concerns at this time.   Past Medical History:  Diagnosis Date   Anxiety    Coronary artery disease    CVA (cerebral vascular accident) (HCC) 01/2020   Diabetes mellitus without complication (HCC)    Hypercholesteremia    Hypertension    Left-sided weakness    S/P stroke    Past Surgical History:  Procedure Laterality Date   AORTIC VALVE REPLACEMENT (AVR)/CORONARY ARTERY BYPASS GRAFTING (CABG)  2009   CAROTID ANGIOGRAPHY N/A 03/21/2020   Procedure: CAROTID ANGIOGRAPHY;  Surgeon: Annice Needy, MD;  Location: [facility] INVASIVE CV LAB;  Service: Cardiovascular;  Laterality: N/A;    Social History   Socioeconomic History   Marital status: Married    Spouse name: Not on file   Number of children: Not on file   Years of education: Not on file   Highest education level: Not on file  Occupational History   Not on file  Tobacco Use   Smoking status: Never   Smokeless tobacco: Never  Vaping Use   Vaping status: Never Used  Substance and Sexual Activity   Alcohol use: Never   Drug use: Never   Sexual activity: Not on file  Other Topics Concern   Not on file  Social History Narrative   Not on file   Social Determinants of Health   Financial Resource Strain: Medium Risk (01/16/2019)   Received from [facility], [facility]   Overall Financial Resource Strain (CARDIA)    Difficulty of Paying Living Expenses: Somewhat hard  Food Insecurity: No Food Insecurity (10/17/2021)   Hunger Vital Sign    Worried About Running Out of  Food in the Last Year: Never true    Ran Out of Food in the Last Year: Never true  Transportation Needs: No Transportation Needs (10/17/2021)   PRAPARE - Administrator, Civil Service (Medical): No    Lack of Transportation (Non-Medical): No  Recent Concern: Transportation Needs - Unmet Transportation Needs (10/17/2021)   PRAPARE - Transportation    Lack of Transportation (Medical): Yes    Lack of Transportation (Non-Medical): Yes  Physical Activity: Sufficiently Active (01/16/2019)   Received from [facility], [facility]   Exercise Vital Sign    Days of Exercise per Week: 5 days    Minutes of Exercise per Session: 30 min  Stress: No Stress Concern Present (01/16/2019)   Received from [facility], [facility]   [employer] of Occupational Health - Occupational Stress Questionnaire    Feeling of Stress : Only a little  Social Connections: Moderately Integrated (01/16/2019)   Received from [facility], [facility]   Social Connection and Isolation Panel [NHANES]    Frequency of Communication with Friends and Family: More than three times a week    Frequency of Social Gatherings with Friends and Family: Once a week    Attends Religious Services: More than 4 times per year  Active Member of Clubs or Organizations: No    Attends Banker Meetings: Never    Marital Status: Married  Catering manager Violence: Not on file    Family History  Problem Relation Age of Onset   Other Mother    Hypertension Mother    Hypertension Father     Allergies  Allergen Reactions   Atorvastatin Other (See Comments)    Muscle Pains   Isosorbide Nitrate Other (See Comments)    Jittery and lightheadedness.   Pravastatin Other (See Comments)   Simvastatin Other (See Comments)    Muscle Pains    Review of Systems  Constitutional: Negative.  Negative for chills,  diaphoresis, fever, malaise/fatigue and weight loss.  HENT: Negative.  Negative for ear discharge and sore throat.   Eyes: Negative.   Respiratory: Negative.  Negative for cough and shortness of breath.   Cardiovascular: Negative.  Negative for chest pain, palpitations and leg swelling.  Gastrointestinal: Negative.  Negative for abdominal pain, constipation, diarrhea, heartburn, nausea and vomiting.  Genitourinary: Negative.  Negative for dysuria and flank pain.  Musculoskeletal: Negative.  Negative for joint pain and myalgias.  Skin: Negative.   Neurological: Negative.  Negative for dizziness and headaches.  Endo/Heme/Allergies: Negative.   Psychiatric/Behavioral: Negative.  Negative for depression and suicidal ideas. The patient is not nervous/anxious.        Objective:   BP (!) 142/70   Pulse (!) 59   Ht 5\' 1"  (1.549 m)   SpO2 97%   BMI 31.16 kg/m   Vitals:   12/07/22 1328  BP: (!) 142/70  Pulse: (!) 59  Height: 5\' 1"  (1.549 m)  Weight: Comment: Pt unable to weigh  SpO2: 97%    Physical Exam Vitals and nursing note reviewed.  Constitutional:      Appearance: Normal appearance.  HENT:     Head: Normocephalic and atraumatic.     Nose: Nose normal.     Mouth/Throat:     Mouth: Mucous membranes are moist.     Pharynx: Oropharynx is clear.  Eyes:     Conjunctiva/sclera: Conjunctivae normal.     Pupils: Pupils are equal, round, and reactive to light.  Cardiovascular:     Rate and Rhythm: Normal rate and regular rhythm.     Pulses: Normal pulses.     Heart sounds: Normal heart sounds. No murmur heard. Pulmonary:     Effort: Pulmonary effort is normal.     Breath sounds: Normal breath sounds. No wheezing.  Abdominal:     General: Bowel sounds are normal.     Palpations: Abdomen is soft.     Tenderness: There is no abdominal tenderness. There is no right CVA tenderness or left CVA tenderness.  Musculoskeletal:        General: Normal range of motion.     Cervical  back: Normal range of motion.     Right lower leg: No edema.     Left lower leg: No edema.  Skin:    General: Skin is warm and dry.  Neurological:     General: No focal deficit present.     Mental Status: She is alert and oriented to person, place, and time.     Motor: Weakness present.     Deep Tendon Reflexes: Reflexes normal.  Psychiatric:        Mood and Affect: Mood normal.        Behavior: Behavior normal.      Results for orders placed or performed  in visit on 12/07/22  POCT CBG (Fasting - Glucose)  Result Value Ref Range   Glucose Fasting, POC 134 (A) 70 - 99 mg/dL    Recent Results (from the past 2160 hour(s))  Cytology - PAP     Status: None   Collection Time: 09/09/22  3:12 PM  Result Value Ref Range   High risk HPV Negative    Adequacy      Satisfactory for evaluation; transformation zone component PRESENT.   Diagnosis      - Negative for intraepithelial lesion or malignancy (NILM)   Microorganisms Trichomonas vaginalis present    Comment Normal Reference Range HPV - Negative   Cervicovaginal ancillary only     Status: Abnormal   Collection Time: 09/09/22  3:13 PM  Result Value Ref Range   Neisseria Gonorrhea Negative    Chlamydia Negative    Trichomonas Positive (A)    Bacterial Vaginitis (gardnerella) Positive (A)    Candida Vaginitis Negative    Candida Glabrata Negative    Comment      Normal Reference Range Bacterial Vaginosis - Negative   Comment Normal Reference Range Candida Species - Negative    Comment Normal Reference Range Candida Galbrata - Negative    Comment Normal Reference Range Trichomonas - Negative    Comment Normal Reference Ranger Chlamydia - Negative    Comment      Normal Reference Range Neisseria Gonorrhea - Negative  POCT CBG (Fasting - Glucose)     Status: Abnormal   Collection Time: 10/06/22  1:30 PM  Result Value Ref Range   Glucose Fasting, POC 140 (A) 70 - 99 mg/dL  POCT CBG (Fasting - Glucose)     Status: Abnormal    Collection Time: 12/07/22  1:36 PM  Result Value Ref Range   Glucose Fasting, POC 134 (A) 70 - 99 mg/dL      Assessment & Plan:  Continue medications.  Schedule mammogram.  Lab work today. Problem List Items Addressed This Visit     Essential hypertension, benign   Relevant Orders   CMP14+EGFR   Hyperlipidemia   Relevant Orders   Lipid Panel w/o Chol/HDL Ratio   Vitamin D deficiency   Relevant Orders   Vitamin D (25 hydroxy)   Coronary artery disease of autologous vein bypass graft with stable angina pectoris (HCC)   Relevant Orders   CBC with Diff   Type 2 diabetes mellitus with hyperglycemia, with long-term current use of insulin (HCC) - Primary   Relevant Medications   HUMALOG KWIKPEN 100 UNIT/ML KwikPen   Other Relevant Orders   POCT CBG (Fasting - Glucose) (Completed)   Weakness of both lower extremities   Other Visit Diagnoses     Breast cancer screening by mammogram       Relevant Orders   MM 3D SCREENING MAMMOGRAM BILATERAL BREAST   Need for immunization against influenza       Relevant Orders   Flu Vaccine Trivalent High Dose (Fluad) (Completed)   Obesity, diabetes, and hypertension syndrome (HCC)       Relevant Medications   HUMALOG KWIKPEN 100 UNIT/ML KwikPen   Other Relevant Orders   Hemoglobin A1c       Return in about 2 months (around 02/06/2023).   Total time spent: 30 minutes  Margaretann Loveless, MD  12/07/2022   This document may have been prepared by [facility] Voice Recognition software and as such may include unintentional dictation errors.

## 2022-12-08 LAB — CMP14+EGFR
ALT: 24 [IU]/L (ref 0–32)
AST: 27 [IU]/L (ref 0–40)
Albumin: 3.9 g/dL (ref 3.9–4.9)
Alkaline Phosphatase: 167 [IU]/L — ABNORMAL HIGH (ref 44–121)
BUN/Creatinine Ratio: 16 (ref 12–28)
BUN: 14 mg/dL (ref 8–27)
Bilirubin Total: 0.3 mg/dL (ref 0.0–1.2)
CO2: 23 mmol/L (ref 20–29)
Calcium: 9.4 mg/dL (ref 8.7–10.3)
Chloride: 103 mmol/L (ref 96–106)
Creatinine, Ser: 0.9 mg/dL (ref 0.57–1.00)
Globulin, Total: 2.7 g/dL (ref 1.5–4.5)
Glucose: 131 mg/dL — ABNORMAL HIGH (ref 70–99)
Potassium: 4.2 mmol/L (ref 3.5–5.2)
Sodium: 140 mmol/L (ref 134–144)
Total Protein: 6.6 g/dL (ref 6.0–8.5)
eGFR: 70 mL/min/{1.73_m2} (ref >59)

## 2022-12-08 LAB — CBC WITH DIFFERENTIAL/PLATELET
Basophils Absolute: 0 10*3/uL (ref 0.0–0.2)
Basos: 1 %
EOS (ABSOLUTE): 0.2 10*3/uL (ref 0.0–0.4)
Eos: 3 %
Hematocrit: 38.5 % (ref 34.0–46.6)
Hemoglobin: 11.8 g/dL (ref 11.1–15.9)
Immature Grans (Abs): 0 10*3/uL (ref 0.0–0.1)
Immature Granulocytes: 0 %
Lymphocytes Absolute: 1.9 10*3/uL (ref 0.7–3.1)
Lymphs: 32 %
MCH: 26.5 pg — ABNORMAL LOW (ref 26.6–33.0)
MCHC: 30.6 g/dL — ABNORMAL LOW (ref 31.5–35.7)
MCV: 86 fL (ref 79–97)
Monocytes Absolute: 0.5 10*3/uL (ref 0.1–0.9)
Monocytes: 8 %
Neutrophils Absolute: 3.3 10*3/uL (ref 1.4–7.0)
Neutrophils: 56 %
Platelets: 245 10*3/uL (ref 150–450)
RBC: 4.46 x10E6/uL (ref 3.77–5.28)
RDW: 14.4 % (ref 11.7–15.4)
WBC: 5.8 10*3/uL (ref 3.4–10.8)

## 2022-12-08 LAB — HEMOGLOBIN A1C
Est. average glucose Bld gHb Est-mCnc: 126 mg/dL
Hgb A1c MFr Bld: 6 % — ABNORMAL HIGH (ref 4.8–5.6)

## 2022-12-08 LAB — LIPID PANEL W/O CHOL/HDL RATIO
Cholesterol, Total: 159 mg/dL (ref 100–199)
HDL: 43 mg/dL (ref >39)
LDL Chol Calc (NIH): 104 mg/dL — ABNORMAL HIGH (ref 0–99)
Triglycerides: 62 mg/dL (ref 0–149)
VLDL Cholesterol Cal: 12 mg/dL (ref 5–40)

## 2022-12-08 LAB — VITAMIN D 25 HYDROXY (VIT D DEFICIENCY, FRACTURES): Vit D, 25-Hydroxy: 38 ng/mL (ref 30.0–100.0)

## 2022-12-09 ENCOUNTER — Other Ambulatory Visit: Payer: Self-pay | Admitting: Internal Medicine

## 2022-12-09 DIAGNOSIS — I10 Essential (primary) hypertension: Secondary | ICD-10-CM

## 2022-12-09 NOTE — Progress Notes (Signed)
Patient notified

## 2022-12-10 ENCOUNTER — Telehealth: Payer: Self-pay | Admitting: Internal Medicine

## 2022-12-10 MED ORDER — CARVEDILOL 25 MG PO TABS
25.0000 mg | ORAL_TABLET | Freq: Two times a day (BID) | ORAL | 3 refills | Status: DC
Start: 2022-12-10 — End: 2024-01-17

## 2022-12-10 NOTE — Telephone Encounter (Signed)
[employer] left VM needing verification of patient's chronic condition so that she can stay in her special needs program.   Callback # [phone] Reference # [id number]

## 2022-12-15 NOTE — Telephone Encounter (Signed)
Verification completed.

## 2023-02-08 ENCOUNTER — Ambulatory Visit: Payer: Medicare HMO | Admitting: Internal Medicine

## 2023-06-24 ENCOUNTER — Ambulatory Visit (INDEPENDENT_AMBULATORY_CARE_PROVIDER_SITE_OTHER): Admitting: Internal Medicine

## 2023-06-24 ENCOUNTER — Encounter: Payer: Self-pay | Admitting: Internal Medicine

## 2023-06-24 VITALS — BP 160/80 | HR 58 | Ht 61.0 in

## 2023-06-24 DIAGNOSIS — Z794 Long term (current) use of insulin: Secondary | ICD-10-CM

## 2023-06-24 DIAGNOSIS — I25718 Atherosclerosis of autologous vein coronary artery bypass graft(s) with other forms of angina pectoris: Secondary | ICD-10-CM | POA: Diagnosis not present

## 2023-06-24 DIAGNOSIS — E1169 Type 2 diabetes mellitus with other specified complication: Secondary | ICD-10-CM | POA: Diagnosis not present

## 2023-06-24 DIAGNOSIS — E1159 Type 2 diabetes mellitus with other circulatory complications: Secondary | ICD-10-CM

## 2023-06-24 DIAGNOSIS — I10 Essential (primary) hypertension: Secondary | ICD-10-CM

## 2023-06-24 DIAGNOSIS — I152 Hypertension secondary to endocrine disorders: Secondary | ICD-10-CM

## 2023-06-24 DIAGNOSIS — E669 Obesity, unspecified: Secondary | ICD-10-CM

## 2023-06-24 DIAGNOSIS — E1165 Type 2 diabetes mellitus with hyperglycemia: Secondary | ICD-10-CM | POA: Diagnosis not present

## 2023-06-24 DIAGNOSIS — E782 Mixed hyperlipidemia: Secondary | ICD-10-CM

## 2023-06-24 LAB — POCT CBG (FASTING - GLUCOSE)-MANUAL ENTRY: Glucose Fasting, POC: 219 mg/dL — AB (ref 70–99)

## 2023-06-24 NOTE — Progress Notes (Signed)
 Established Patient Office Visit  Subjective:  Patient ID: Danielle Paul, female    DOB: Sep 19, 1955  Age: 68 y.o. MRN: [id number]  Chief Complaint  Patient presents with   Follow-up    Patient comes in for follow-up today.  He has she has not been in since October 2024.  Generally feels well but her blood pressure is very high.  Patient is not sure at what time she took her medications.  Denies chest pain or shortness of breath, no palpitations, no nausea or vomiting.  Will get her blood work done today and patient advised to bring in all her medication bottles.  Will discuss results at that time also.    No other concerns at this time.   Past Medical History:  Diagnosis Date   Anxiety    Coronary artery disease    CVA (cerebral vascular accident) (HCC) 01/2020   Diabetes mellitus without complication (HCC)    Hypercholesteremia    Hypertension    Left-sided weakness    S/P stroke    Past Surgical History:  Procedure Laterality Date   AORTIC VALVE REPLACEMENT (AVR)/CORONARY ARTERY BYPASS GRAFTING (CABG)  2009   CAROTID ANGIOGRAPHY N/A 03/21/2020   Procedure: CAROTID ANGIOGRAPHY;  Surgeon: Celso College, MD;  Location: [facility] INVASIVE CV LAB;  Service: Cardiovascular;  Laterality: N/A;    Social History   Socioeconomic History   Marital status: Married    Spouse name: Not on file   Number of children: Not on file   Years of education: Not on file   Highest education level: Not on file  Occupational History   Not on file  Tobacco Use   Smoking status: Never   Smokeless tobacco: Never  Vaping Use   Vaping status: Never Used  Substance and Sexual Activity   Alcohol use: Never   Drug use: Never   Sexual activity: Not on file  Other Topics Concern   Not on file  Social History Narrative   Not on file   Social Drivers of Health   Financial Resource Strain: Medium Risk (01/16/2019)   Received from [facility], [facility]    Overall Financial Resource Strain (CARDIA)    Difficulty of Paying Living Expenses: Somewhat hard  Food Insecurity: No Food Insecurity (10/17/2021)   Hunger Vital Sign    Worried About Running Out of Food in the Last Year: Never true    Ran Out of Food in the Last Year: Never true  Transportation Needs: No Transportation Needs (10/17/2021)   PRAPARE - Administrator, Civil Service (Medical): No    Lack of Transportation (Non-Medical): No  Recent Concern: Transportation Needs - Unmet Transportation Needs (10/17/2021)   PRAPARE - Transportation    Lack of Transportation (Medical): Yes    Lack of Transportation (Non-Medical): Yes  Physical Activity: Sufficiently Active (01/16/2019)   Received from [facility], [facility]   Exercise Vital Sign    Days of Exercise per Week: 5 days    Minutes of Exercise per Session: 30 min  Stress: No Stress Concern Present (01/16/2019)   Received from [facility], [facility]   [employer] of Occupational Health - Occupational Stress Questionnaire    Feeling of Stress : Only a little  Social Connections: Moderately Integrated (01/16/2019)   Received from [facility], [facility]   Social Connection and Isolation Panel [facility]    Frequency  of Communication with Friends and Family: More than three times a week    Frequency of Social Gatherings with Friends and Family: Once a week    Attends Religious Services: More than 4 times per year    Active Member of [employer] or Organizations: No    Attends Banker Meetings: Never    Marital Status: Married  Catering manager Violence: Not on file    Family History  Problem Relation Age of Onset   Other Mother    Hypertension Mother    Hypertension Father     Allergies  Allergen Reactions   Atorvastatin Other (See Comments)    Muscle Pains   Isosorbide Nitrate Other (See  Comments)    Jittery and lightheadedness.   Pravastatin Other (See Comments)   Simvastatin Other (See Comments)    Muscle Pains    Outpatient Medications Prior to Visit  Medication Sig   ACCU-CHEK GUIDE test strip    amLODipine  (NORVASC ) 5 MG tablet TAKE ONE TABLET BY MOUTH DAILY   Ascorbic Acid (VITAMIN C ) 100 MG tablet Take 1 tablet (100 mg total) by mouth daily.   ASPIRIN  LOW DOSE 81 MG tablet TAKE ONE TABLET BY MOUTH DAILY   B-D ULTRAFINE III SHORT PEN 31G X 8 MM MISC USE TO INJECT INSULIN  FOUR TIMES DAILY   Blood Glucose Monitoring Suppl (TRUE METRIX AIR GLUCOSE METER) w/Device KIT 1 each by Does not apply route 3 (three) times daily.   carvedilol  (COREG ) 25 MG tablet Take 1 tablet (25 mg total) by mouth 2 (two) times daily.   Cholecalciferol  1.25 MG (50000 UT) capsule Take 1 capsule (50,000 Units total) by mouth once a week.   clopidogrel  (PLAVIX ) 75 MG tablet TAKE ONE TABLET BY MOUTH DAILY   Continuous Glucose Receiver (FREESTYLE LIBRE 2 READER) DEVI Use to check glucose up to twice daily   CYANOCOBALAMIN PO Take by mouth daily.   FARXIGA  10 MG TABS tablet Take 1 tablet (10 mg total) by mouth daily.   FLUoxetine  (PROZAC ) 10 MG capsule TAKE ONE CAPSULE BY MOUTH DAILY   gabapentin  (NEURONTIN ) 100 MG capsule TAKE ONE CAPSULE BY MOUTH AT BEDTIME   glucose blood (TRUE METRIX BLOOD GLUCOSE TEST) test strip 1 each by Other route 3 (three) times daily. Use as instructed   GNP Sterile Lancets 33G MISC USE AS DIRECTED TO CHECK GLUCOSE   HUMALOG  KWIKPEN 100 UNIT/ML KwikPen Inject 7 Units into the skin 3 (three) times daily.   LANTUS SOLOSTAR 100 UNIT/ML Solostar Pen INJECT 25 UNITS UNDER THE SKIN (SUBCUTANEOUSLY) AT BEDTIME   nitroGLYCERIN (NITROSTAT) 0.3 MG SL tablet DISSOLVE 1 TABLET UNDER TONGUE AS NEEDED FOR CHEST PAIN. MAY REPEAT IN 5 MINUTES AS DIRECTED   NOVOLOG  FLEXPEN 100 UNIT/ML FlexPen USE AS DIRECTED INJECT THREE TIMES DAILY UNDER THE SKIN (SUBCUTANEOUSLY) PER SLIDING SCALE MAX OF  30 UNITS DAILY   ONETOUCH VERIO test strip USE TO CHECK BLOOD SUGARS THREE TIMES DAILY   polyethylene glycol (MIRALAX  / GLYCOLAX ) 17 g packet Take 17 g by mouth daily.   rosuvastatin  (CRESTOR ) 40 MG tablet TAKE ONE TABLET BY MOUTH DAILY   Semaglutide , 1 MG/DOSE, 4 MG/3ML SOPN Inject 1 mg into the skin once a week.   spironolactone  (ALDACTONE ) 25 MG tablet Take 25 mg by mouth daily.   [DISCONTINUED] predniSONE  (DELTASONE ) 50 MG tablet Take 1 tablet (50 mg total) by mouth daily with breakfast. (Patient not taking: Reported on 06/24/2023)   No facility-administered medications prior to visit.  Review of Systems  Constitutional:  Positive for malaise/fatigue. Negative for chills, fever and weight loss.  HENT: Negative.  Negative for sore throat.   Eyes: Negative.   Respiratory: Negative.  Negative for cough and shortness of breath.   Cardiovascular: Negative.  Negative for chest pain, palpitations and leg swelling.  Gastrointestinal: Negative.  Negative for abdominal pain, constipation, diarrhea, heartburn, nausea and vomiting.  Genitourinary: Negative.  Negative for dysuria and flank pain.  Musculoskeletal: Negative.  Negative for joint pain and myalgias.  Skin: Negative.   Neurological: Negative.  Negative for dizziness, tingling, tremors, sensory change and headaches.  Endo/Heme/Allergies: Negative.   Psychiatric/Behavioral: Negative.  Negative for depression and suicidal ideas. The patient is not nervous/anxious.        Objective:   BP (!) 160/80   Pulse (!) 58   Ht 5\' 1"  (1.549 m)   SpO2 99%   BMI 31.16 kg/m   Vitals:   06/24/23 1313  BP: (!) 160/80  Pulse: (!) 58  Height: 5\' 1"  (1.549 m)  SpO2: 99%    Physical Exam Vitals and nursing note reviewed.  Constitutional:      Appearance: Normal appearance.  HENT:     Head: Normocephalic and atraumatic.     Nose: Nose normal.     Mouth/Throat:     Mouth: Mucous membranes are moist.     Pharynx: Oropharynx is clear.   Eyes:     Conjunctiva/sclera: Conjunctivae normal.     Pupils: Pupils are equal, round, and reactive to light.  Cardiovascular:     Rate and Rhythm: Normal rate and regular rhythm.     Pulses: Normal pulses.     Heart sounds: Normal heart sounds. No murmur heard. Pulmonary:     Effort: Pulmonary effort is normal.     Breath sounds: Normal breath sounds. No wheezing.  Chest:  Breasts:    Right: Normal. No swelling, bleeding, inverted nipple, mass, nipple discharge, skin change or tenderness.     Left: Normal. No swelling, bleeding, inverted nipple, mass, nipple discharge, skin change or tenderness.  Abdominal:     General: Bowel sounds are normal.     Palpations: Abdomen is soft.     Tenderness: There is no abdominal tenderness. There is no right CVA tenderness or left CVA tenderness.  Musculoskeletal:        General: Normal range of motion.     Cervical back: Normal range of motion.     Right lower leg: No edema.     Left lower leg: No edema.  Lymphadenopathy:     Upper Body:     Right upper body: No supraclavicular, axillary or pectoral adenopathy.     Left upper body: No supraclavicular, axillary or pectoral adenopathy.  Skin:    General: Skin is warm and dry.  Neurological:     General: No focal deficit present.     Mental Status: She is alert and oriented to person, place, and time.  Psychiatric:        Mood and Affect: Mood normal.        Behavior: Behavior normal.      Results for orders placed or performed in visit on 06/24/23  POCT CBG (Fasting - Glucose)  Result Value Ref Range   Glucose Fasting, POC 219 (A) 70 - 99 mg/dL    Recent Results (from the past 2160 hours)  POCT CBG (Fasting - Glucose)     Status: Abnormal   Collection Time: 06/24/23  1:18 PM  Result Value Ref Range   Glucose Fasting, POC 219 (A) 70 - 99 mg/dL      Assessment & Plan:  Continue current medications.  Check lab work.  Return with her medication bottles.  Will discuss lab  results. Problem List Items Addressed This Visit     Essential hypertension, benign - Primary   Relevant Orders   CMP14+EGFR   Hyperlipidemia   Relevant Orders   Lipid Panel w/o Chol/HDL Ratio   Coronary artery disease of autologous vein bypass graft with stable angina pectoris (HCC)   Relevant Orders   CBC with Diff   Type 2 diabetes mellitus with hyperglycemia, with long-term current use of insulin  (HCC)   Relevant Orders   POCT CBG (Fasting - Glucose) (Completed)   Other Visit Diagnoses       Obesity, diabetes, and hypertension syndrome (HCC)       Relevant Orders   Hemoglobin A1c       Return in about 2 weeks (around 07/08/2023).   Total time spent: 30 minutes  Aisha Hove, MD  06/24/2023   This document may have been prepared by [facility] Voice Recognition software and as such may include unintentional dictation errors.

## 2023-06-25 LAB — CBC WITH DIFFERENTIAL/PLATELET
Basophils Absolute: 0 10*3/uL (ref 0.0–0.2)
Basos: 1 %
EOS (ABSOLUTE): 0.2 10*3/uL (ref 0.0–0.4)
Eos: 3 %
Hematocrit: 41.3 % (ref 34.0–46.6)
Hemoglobin: 13.3 g/dL (ref 11.1–15.9)
Immature Grans (Abs): 0 10*3/uL (ref 0.0–0.1)
Immature Granulocytes: 0 %
Lymphocytes Absolute: 1.9 10*3/uL (ref 0.7–3.1)
Lymphs: 33 %
MCH: 27.1 pg (ref 26.6–33.0)
MCHC: 32.2 g/dL (ref 31.5–35.7)
MCV: 84 fL (ref 79–97)
Monocytes Absolute: 0.5 10*3/uL (ref 0.1–0.9)
Monocytes: 8 %
Neutrophils Absolute: 3.3 10*3/uL (ref 1.4–7.0)
Neutrophils: 55 %
Platelets: 245 10*3/uL (ref 150–450)
RBC: 4.9 x10E6/uL (ref 3.77–5.28)
RDW: 13.1 % (ref 11.7–15.4)
WBC: 5.9 10*3/uL (ref 3.4–10.8)

## 2023-06-25 LAB — CMP14+EGFR
ALT: 11 IU/L (ref 0–32)
AST: 15 IU/L (ref 0–40)
Albumin: 3.8 g/dL — ABNORMAL LOW (ref 3.9–4.9)
Alkaline Phosphatase: 104 IU/L (ref 44–121)
BUN/Creatinine Ratio: 15 (ref 12–28)
BUN: 17 mg/dL (ref 8–27)
Bilirubin Total: 0.2 mg/dL (ref 0.0–1.2)
CO2: 25 mmol/L (ref 20–29)
Calcium: 9.7 mg/dL (ref 8.7–10.3)
Chloride: 99 mmol/L (ref 96–106)
Creatinine, Ser: 1.1 mg/dL — ABNORMAL HIGH (ref 0.57–1.00)
Globulin, Total: 2.4 g/dL (ref 1.5–4.5)
Glucose: 202 mg/dL — ABNORMAL HIGH (ref 70–99)
Potassium: 4.1 mmol/L (ref 3.5–5.2)
Sodium: 138 mmol/L (ref 134–144)
Total Protein: 6.2 g/dL (ref 6.0–8.5)
eGFR: 55 mL/min/{1.73_m2} — ABNORMAL LOW (ref >59)

## 2023-06-25 LAB — HEMOGLOBIN A1C
Est. average glucose Bld gHb Est-mCnc: 315 mg/dL
Hgb A1c MFr Bld: 12.6 % — ABNORMAL HIGH (ref 4.8–5.6)

## 2023-06-25 LAB — LIPID PANEL W/O CHOL/HDL RATIO
Cholesterol, Total: 251 mg/dL — ABNORMAL HIGH (ref 100–199)
HDL: 47 mg/dL (ref >39)
LDL Chol Calc (NIH): 188 mg/dL — ABNORMAL HIGH (ref 0–99)
Triglycerides: 94 mg/dL (ref 0–149)
VLDL Cholesterol Cal: 16 mg/dL (ref 5–40)

## 2023-06-28 NOTE — Progress Notes (Signed)
 Patient notified

## 2023-07-08 ENCOUNTER — Encounter: Payer: Self-pay | Admitting: Internal Medicine

## 2023-07-08 ENCOUNTER — Ambulatory Visit: Admitting: Internal Medicine

## 2023-07-08 ENCOUNTER — Ambulatory Visit: Payer: Self-pay | Admitting: Internal Medicine

## 2023-07-08 VITALS — BP 192/82 | HR 67 | Ht 61.0 in

## 2023-07-08 DIAGNOSIS — I152 Hypertension secondary to endocrine disorders: Secondary | ICD-10-CM | POA: Diagnosis not present

## 2023-07-08 DIAGNOSIS — E1165 Type 2 diabetes mellitus with hyperglycemia: Secondary | ICD-10-CM | POA: Diagnosis not present

## 2023-07-08 DIAGNOSIS — E782 Mixed hyperlipidemia: Secondary | ICD-10-CM | POA: Diagnosis not present

## 2023-07-08 DIAGNOSIS — I25718 Atherosclerosis of autologous vein coronary artery bypass graft(s) with other forms of angina pectoris: Secondary | ICD-10-CM | POA: Diagnosis not present

## 2023-07-08 DIAGNOSIS — E559 Vitamin D deficiency, unspecified: Secondary | ICD-10-CM

## 2023-07-08 DIAGNOSIS — Z794 Long term (current) use of insulin: Secondary | ICD-10-CM

## 2023-07-08 DIAGNOSIS — E1159 Type 2 diabetes mellitus with other circulatory complications: Secondary | ICD-10-CM

## 2023-07-08 DIAGNOSIS — I10 Essential (primary) hypertension: Secondary | ICD-10-CM

## 2023-07-08 LAB — POCT CBG (FASTING - GLUCOSE)-MANUAL ENTRY: Glucose Fasting, POC: 202 mg/dL — AB (ref 70–99)

## 2023-07-08 MED ORDER — SPIRONOLACTONE 25 MG PO TABS
25.0000 mg | ORAL_TABLET | Freq: Every day | ORAL | 3 refills | Status: AC
Start: 2023-07-08 — End: ?

## 2023-07-08 MED ORDER — TRUE METRIX AIR GLUCOSE METER W/DEVICE KIT
1.0000 | PACK | Freq: Three times a day (TID) | 0 refills | Status: DC
Start: 2023-07-08 — End: 2023-07-26

## 2023-07-08 MED ORDER — INSULIN PEN NEEDLE 32G X 6 MM MISC: 1.0000 | 3 refills | Status: DC

## 2023-07-08 MED ORDER — AMLODIPINE BESYLATE 10 MG PO TABS: 10.0000 mg | ORAL_TABLET | Freq: Every day | ORAL | 11 refills | Status: DC

## 2023-07-08 MED ORDER — CHOLECALCIFEROL 1.25 MG (50000 UT) PO CAPS: 50000.0000 [IU] | ORAL_CAPSULE | ORAL | 3 refills | Status: AC

## 2023-07-08 MED ORDER — GABAPENTIN 100 MG PO CAPS
100.0000 mg | ORAL_CAPSULE | Freq: Every day | ORAL | 3 refills | Status: AC
Start: 2023-07-08 — End: ?

## 2023-07-08 MED ORDER — SEMAGLUTIDE (1 MG/DOSE) 4 MG/3ML ~~LOC~~ SOPN
1.0000 mg | PEN_INJECTOR | SUBCUTANEOUS | 5 refills | Status: DC
Start: 2023-07-08 — End: 2023-07-26

## 2023-07-08 MED ORDER — TRUE METRIX BLOOD GLUCOSE TEST VI STRP
1.0000 | ORAL_STRIP | Freq: Three times a day (TID) | 3 refills | Status: DC
Start: 2023-07-08 — End: 2023-07-26

## 2023-07-08 NOTE — Progress Notes (Signed)
 Established Patient Office Visit  Subjective:  Patient ID: Danielle Paul, female    DOB: 1955/09/06  Age: 68 y.o. MRN: [id number]  Chief Complaint  Patient presents with   Follow-up    2 week follow up    Patient came in for follow-up and also brought in her medications.  Reports of needing refills on her blood pressure medications.  Her BP is very high today.  She has not taken her Norvasc  5 mg or spironolactone .  Will increase her Norvasc  to 10 mg.  Refills sent and patient advised to bring all her medicines again at next visit. Denies headache or dizziness, no nausea or vomiting, no chest pain and no palpitations.    No other concerns at this time.   Past Medical History:  Diagnosis Date   Anxiety    Coronary artery disease    CVA (cerebral vascular accident) (HCC) 01/2020   Diabetes mellitus without complication (HCC)    Hypercholesteremia    Hypertension    Left-sided weakness    S/P stroke    Past Surgical History:  Procedure Laterality Date   AORTIC VALVE REPLACEMENT (AVR)/CORONARY ARTERY BYPASS GRAFTING (CABG)  2009   CAROTID ANGIOGRAPHY N/A 03/21/2020   Procedure: CAROTID ANGIOGRAPHY;  Surgeon: Celso College, MD;  Location: [facility] INVASIVE CV LAB;  Service: Cardiovascular;  Laterality: N/A;    Social History   Socioeconomic History   Marital status: Married    Spouse name: Not on file   Number of children: Not on file   Years of education: Not on file   Highest education level: Not on file  Occupational History   Not on file  Tobacco Use   Smoking status: Never   Smokeless tobacco: Never  Vaping Use   Vaping status: Never Used  Substance and Sexual Activity   Alcohol use: Never   Drug use: Never   Sexual activity: Not on file  Other Topics Concern   Not on file  Social History Narrative   Not on file   Social Drivers of Health   Financial Resource Strain: Medium Risk (01/16/2019)   Received from [facility], [facility]   Overall Financial Resource Strain (CARDIA)    Difficulty of Paying Living Expenses: Somewhat hard  Food Insecurity: No Food Insecurity (10/17/2021)   Hunger Vital Sign    Worried About Running Out of Food in the Last Year: Never true    Ran Out of Food in the Last Year: Never true  Transportation Needs: No Transportation Needs (10/17/2021)   PRAPARE - Administrator, Civil Service (Medical): No    Lack of Transportation (Non-Medical): No  Recent Concern: Transportation Needs - Unmet Transportation Needs (10/17/2021)   PRAPARE - Transportation    Lack of Transportation (Medical): Yes    Lack of Transportation (Non-Medical): Yes  Physical Activity: Sufficiently Active (01/16/2019)   Received from [facility], [facility]   Exercise Vital Sign    Days of Exercise per Week: 5 days    Minutes of Exercise per Session: 30 min  Stress: No Stress Concern Present (01/16/2019)   Received from [facility], [facility]   [employer] of Occupational Health - Occupational Stress Questionnaire    Feeling of Stress : Only a little  Social Connections: Moderately Integrated (01/16/2019)   Received from [facility], [facility]   Social Connection and Isolation Panel [facility]  Frequency of Communication with Friends and Family: More than three times a week    Frequency of Social Gatherings with Friends and Family: Once a week    Attends Religious Services: More than 4 times per year    Active Member of [employer] or Organizations: No    Attends Banker Meetings: Never    Marital Status: Married  Catering manager Violence: Not on file    Family History  Problem Relation Age of Onset   Other Mother    Hypertension Mother    Hypertension Father     Allergies  Allergen Reactions   Atorvastatin Other (See Comments)    Muscle Pains   Isosorbide  Nitrate Other (See Comments)    Jittery and lightheadedness.   Pravastatin Other (See Comments)   Simvastatin Other (See Comments)    Muscle Pains    Outpatient Medications Prior to Visit  Medication Sig   ASPIRIN  LOW DOSE 81 MG tablet TAKE ONE TABLET BY MOUTH DAILY   carvedilol  (COREG ) 25 MG tablet Take 1 tablet (25 mg total) by mouth 2 (two) times daily.   clopidogrel  (PLAVIX ) 75 MG tablet TAKE ONE TABLET BY MOUTH DAILY   FARXIGA  10 MG TABS tablet Take 1 tablet (10 mg total) by mouth daily.   FLUoxetine  (PROZAC ) 20 MG capsule Take 20 mg by mouth daily.   GNP Sterile Lancets 33G MISC USE AS DIRECTED TO CHECK GLUCOSE   HUMALOG  KWIKPEN 100 UNIT/ML KwikPen Inject 7 Units into the skin 3 (three) times daily.   LANTUS SOLOSTAR 100 UNIT/ML Solostar Pen INJECT 25 UNITS UNDER THE SKIN (SUBCUTANEOUSLY) AT BEDTIME   rosuvastatin  (CRESTOR ) 40 MG tablet TAKE ONE TABLET BY MOUTH DAILY   [DISCONTINUED] ACCU-CHEK GUIDE test strip    [DISCONTINUED] B-D ULTRAFINE III SHORT PEN 31G X 8 MM MISC USE TO INJECT INSULIN  FOUR TIMES DAILY   [DISCONTINUED] glucose blood (TRUE METRIX BLOOD GLUCOSE TEST) test strip 1 each by Other route 3 (three) times daily. Use as instructed   [DISCONTINUED] ONETOUCH VERIO test strip USE TO CHECK BLOOD SUGARS THREE TIMES DAILY   nitroGLYCERIN (NITROSTAT) 0.3 MG SL tablet DISSOLVE 1 TABLET UNDER TONGUE AS NEEDED FOR CHEST PAIN. MAY REPEAT IN 5 MINUTES AS DIRECTED (Patient not taking: Reported on 07/08/2023)   NOVOLOG  FLEXPEN 100 UNIT/ML FlexPen USE AS DIRECTED INJECT THREE TIMES DAILY UNDER THE SKIN (SUBCUTANEOUSLY) PER SLIDING SCALE MAX OF 30 UNITS DAILY (Patient not taking: Reported on 07/08/2023)   [DISCONTINUED] amLODipine  (NORVASC ) 5 MG tablet TAKE ONE TABLET BY MOUTH DAILY (Patient not taking: Reported on 07/08/2023)   [DISCONTINUED] Ascorbic Acid (VITAMIN C ) 100 MG tablet Take 1 tablet (100 mg total) by mouth daily. (Patient not taking: Reported on 07/08/2023)   [DISCONTINUED]  Blood Glucose Monitoring Suppl (TRUE METRIX AIR GLUCOSE METER) w/Device KIT 1 each by Does not apply route 3 (three) times daily. (Patient not taking: Reported on 07/08/2023)   [DISCONTINUED] Cholecalciferol  1.25 MG (50000 UT) capsule Take 1 capsule (50,000 Units total) by mouth once a week. (Patient not taking: Reported on 07/08/2023)   [DISCONTINUED] Continuous Glucose Receiver (FREESTYLE LIBRE 2 READER) DEVI Use to check glucose up to twice daily (Patient not taking: Reported on 07/08/2023)   [DISCONTINUED] CYANOCOBALAMIN PO Take by mouth daily. (Patient not taking: Reported on 07/08/2023)   [DISCONTINUED] FLUoxetine  (PROZAC ) 10 MG capsule TAKE ONE CAPSULE BY MOUTH DAILY (Patient not taking: Reported on 07/08/2023)   [DISCONTINUED] gabapentin  (NEURONTIN ) 100 MG capsule TAKE ONE CAPSULE BY MOUTH AT BEDTIME (  Patient not taking: Reported on 07/08/2023)   [DISCONTINUED] polyethylene glycol (MIRALAX  / GLYCOLAX ) 17 g packet Take 17 g by mouth daily. (Patient not taking: Reported on 07/08/2023)   [DISCONTINUED] Semaglutide , 1 MG/DOSE, 4 MG/3ML SOPN Inject 1 mg into the skin once a week. (Patient not taking: Reported on 07/08/2023)   [DISCONTINUED] spironolactone  (ALDACTONE ) 25 MG tablet Take 25 mg by mouth daily. (Patient not taking: Reported on 07/08/2023)   No facility-administered medications prior to visit.    Review of Systems  Constitutional: Negative.  Negative for chills, fever, malaise/fatigue and weight loss.  HENT: Negative.  Negative for sinus pain and sore throat.   Eyes: Negative.   Respiratory: Negative.  Negative for cough and shortness of breath.   Cardiovascular: Negative.  Negative for chest pain, palpitations and leg swelling.  Gastrointestinal: Negative.  Negative for abdominal pain, constipation, diarrhea, heartburn, nausea and vomiting.  Genitourinary: Negative.  Negative for dysuria and flank pain.  Musculoskeletal: Negative.  Negative for joint pain and myalgias.  Skin: Negative.    Neurological: Negative.  Negative for dizziness, tingling, tremors and headaches.  Endo/Heme/Allergies: Negative.   Psychiatric/Behavioral: Negative.  Negative for depression and suicidal ideas. The patient is not nervous/anxious.        Objective:   BP (!) 192/82   Pulse 67   Ht 5\' 1"  (1.549 m)   SpO2 98%   BMI 31.16 kg/m   Vitals:   07/08/23 1303  BP: (!) 192/82  Pulse: 67  Height: 5\' 1"  (1.549 m)  Weight: Comment: Pt unable to weigh  SpO2: 98%    Physical Exam Vitals and nursing note reviewed.  Constitutional:      Appearance: Normal appearance.  HENT:     Head: Normocephalic and atraumatic.     Nose: Nose normal.     Mouth/Throat:     Mouth: Mucous membranes are moist.     Pharynx: Oropharynx is clear.  Eyes:     Conjunctiva/sclera: Conjunctivae normal.     Pupils: Pupils are equal, round, and reactive to light.  Cardiovascular:     Rate and Rhythm: Normal rate and regular rhythm.     Pulses: Normal pulses.     Heart sounds: Normal heart sounds. No murmur heard. Pulmonary:     Effort: Pulmonary effort is normal.     Breath sounds: Normal breath sounds. No wheezing.  Abdominal:     General: Bowel sounds are normal.     Palpations: Abdomen is soft.     Tenderness: There is no abdominal tenderness. There is no right CVA tenderness or left CVA tenderness.  Musculoskeletal:        General: Normal range of motion.     Cervical back: Normal range of motion.     Right lower leg: No edema.     Left lower leg: No edema.  Skin:    General: Skin is warm and dry.  Neurological:     General: No focal deficit present.     Mental Status: She is alert and oriented to person, place, and time.  Psychiatric:        Mood and Affect: Mood normal.        Behavior: Behavior normal.      Results for orders placed or performed in visit on 07/08/23  POCT CBG (Fasting - Glucose)  Result Value Ref Range   Glucose Fasting, POC 202 (A) 70 - 99 mg/dL    Recent Results  (from the past 2160 hours)  POCT CBG (  Fasting - Glucose)     Status: Abnormal   Collection Time: 06/24/23  1:18 PM  Result Value Ref Range   Glucose Fasting, POC 219 (A) 70 - 99 mg/dL  [id number]     Status: Abnormal   Collection Time: 06/24/23  1:45 PM  Result Value Ref Range   Glucose 202 (H) 70 - 99 mg/dL   BUN 17 8 - 27 mg/dL   Creatinine, Ser 8.29 (H) 0.57 - 1.00 mg/dL   eGFR 55 (L) >56 OZ/HYQ/6.57   BUN/Creatinine Ratio 15 12 - 28   Sodium 138 134 - 144 mmol/L   Potassium 4.1 3.5 - 5.2 mmol/L   Chloride 99 96 - 106 mmol/L   CO2 25 20 - 29 mmol/L   Calcium  9.7 8.7 - 10.3 mg/dL   Total Protein 6.2 6.0 - 8.5 g/dL   Albumin 3.8 (L) 3.9 - 4.9 g/dL   Globulin, Total 2.4 1.5 - 4.5 g/dL   Bilirubin Total 0.2 0.0 - 1.2 mg/dL   Alkaline Phosphatase 104 44 - 121 IU/L   AST 15 0 - 40 IU/L   ALT 11 0 - 32 IU/L  CBC with Diff     Status: None   Collection Time: 06/24/23  1:45 PM  Result Value Ref Range   WBC 5.9 3.4 - 10.8 x10E3/uL   RBC 4.90 3.77 - 5.28 x10E6/uL   Hemoglobin 13.3 11.1 - 15.9 g/dL   Hematocrit 84.6 96.2 - 46.6 %   MCV 84 79 - 97 fL   MCH 27.1 26.6 - 33.0 pg   MCHC 32.2 31.5 - 35.7 g/dL   RDW 95.2 84.1 - 32.4 %   Platelets 245 150 - 450 x10E3/uL   Neutrophils 55 Not Estab. %   Lymphs 33 Not Estab. %   Monocytes 8 Not Estab. %   Eos 3 Not Estab. %   Basos 1 Not Estab. %   Neutrophils Absolute 3.3 1.4 - 7.0 x10E3/uL   Lymphocytes Absolute 1.9 0.7 - 3.1 x10E3/uL   Monocytes Absolute 0.5 0.1 - 0.9 x10E3/uL   EOS (ABSOLUTE) 0.2 0.0 - 0.4 x10E3/uL   Basophils Absolute 0.0 0.0 - 0.2 x10E3/uL   Immature Granulocytes 0 Not Estab. %   Immature Grans (Abs) 0.0 0.0 - 0.1 x10E3/uL  Lipid Panel w/o Chol/HDL Ratio     Status: Abnormal   Collection Time: 06/24/23  1:45 PM  Result Value Ref Range   Cholesterol, Total 251 (H) 100 - 199 mg/dL   Triglycerides 94 0 - 149 mg/dL   HDL 47 >40 mg/dL   VLDL Cholesterol Cal 16 5 - 40 mg/dL   LDL Chol Calc (NIH) 102 (H) 0 - 99  mg/dL  Hemoglobin V2Z     Status: Abnormal   Collection Time: 06/24/23  1:45 PM  Result Value Ref Range   Hgb A1c MFr Bld 12.6 (H) 4.8 - 5.6 %    Comment:          Prediabetes: 5.7 - 6.4          Diabetes: >6.4          Glycemic control for adults with diabetes: <7.0    Est. average glucose Bld gHb Est-mCnc 315 mg/dL  POCT CBG (Fasting - Glucose)     Status: Abnormal   Collection Time: 07/08/23  1:12 PM  Result Value Ref Range   Glucose Fasting, POC 202 (A) 70 - 99 mg/dL      Assessment & Plan:  Increase dose of Norvasc .  Resume all the rest of medications.  Check blood pressure at home if possible.  Return in 10 days for further medication adjustment. Problem List Items Addressed This Visit     Essential hypertension, benign   Relevant Medications   spironolactone  (ALDACTONE ) 25 MG tablet   amLODipine  (NORVASC ) 10 MG tablet   gabapentin  (NEURONTIN ) 100 MG capsule   Hyperlipidemia   Relevant Medications   spironolactone  (ALDACTONE ) 25 MG tablet   amLODipine  (NORVASC ) 10 MG tablet   gabapentin  (NEURONTIN ) 100 MG capsule   Vitamin D  deficiency   Relevant Medications   Cholecalciferol  1.25 MG (50000 UT) capsule   Coronary artery disease of autologous vein bypass graft with stable angina pectoris (HCC)   Relevant Medications   FLUoxetine  (PROZAC ) 20 MG capsule   spironolactone  (ALDACTONE ) 25 MG tablet   amLODipine  (NORVASC ) 10 MG tablet   gabapentin  (NEURONTIN ) 100 MG capsule   Type 2 diabetes mellitus with hyperglycemia, with long-term current use of insulin  (HCC)   Relevant Medications   Blood Glucose Monitoring Suppl (TRUE METRIX AIR GLUCOSE METER) w/Device KIT   glucose blood (TRUE METRIX BLOOD GLUCOSE TEST) test strip   Semaglutide , 1 MG/DOSE, 4 MG/3ML SOPN   Insulin  Pen Needle 32G X 6 MM MISC   Other Relevant Orders   POCT CBG (Fasting - Glucose) (Completed)   Other Visit Diagnoses       Hypertension associated with diabetes (HCC)    -  Primary   Relevant  Medications   spironolactone  (ALDACTONE ) 25 MG tablet   amLODipine  (NORVASC ) 10 MG tablet   Semaglutide , 1 MG/DOSE, 4 MG/3ML SOPN       Return in about 10 days (around 07/18/2023).   Total time spent: 30 minutes  Aisha Hove, MD  07/08/2023   This document may have been prepared by [facility] Voice Recognition software and as such may include unintentional dictation errors.

## 2023-07-19 ENCOUNTER — Ambulatory Visit: Admitting: Internal Medicine

## 2023-07-23 ENCOUNTER — Telehealth: Payer: Self-pay | Admitting: Internal Medicine

## 2023-07-23 DIAGNOSIS — E1159 Type 2 diabetes mellitus with other circulatory complications: Secondary | ICD-10-CM

## 2023-07-23 MED ORDER — AMLODIPINE BESYLATE 10 MG PO TABS
10.0000 mg | ORAL_TABLET | Freq: Every day | ORAL | 11 refills | Status: DC
Start: 2023-07-23 — End: 2023-07-26

## 2023-07-23 NOTE — Addendum Note (Signed)
 Addended by: Francie Irani on: 07/23/2023 02:40 PM   Modules accepted: Orders

## 2023-07-23 NOTE — Telephone Encounter (Signed)
 Pt would like her amlodipine  sent to [facility] in [facility]

## 2023-07-26 ENCOUNTER — Ambulatory Visit: Payer: Self-pay | Admitting: Internal Medicine

## 2023-07-26 ENCOUNTER — Other Ambulatory Visit: Payer: Self-pay | Admitting: Internal Medicine

## 2023-07-26 ENCOUNTER — Ambulatory Visit (INDEPENDENT_AMBULATORY_CARE_PROVIDER_SITE_OTHER): Admitting: Internal Medicine

## 2023-07-26 ENCOUNTER — Encounter: Payer: Self-pay | Admitting: Internal Medicine

## 2023-07-26 VITALS — BP 154/90 | HR 69 | Ht 61.0 in

## 2023-07-26 DIAGNOSIS — E782 Mixed hyperlipidemia: Secondary | ICD-10-CM

## 2023-07-26 DIAGNOSIS — I25718 Atherosclerosis of autologous vein coronary artery bypass graft(s) with other forms of angina pectoris: Secondary | ICD-10-CM | POA: Diagnosis not present

## 2023-07-26 DIAGNOSIS — E1165 Type 2 diabetes mellitus with hyperglycemia: Secondary | ICD-10-CM

## 2023-07-26 DIAGNOSIS — I152 Hypertension secondary to endocrine disorders: Secondary | ICD-10-CM | POA: Diagnosis not present

## 2023-07-26 DIAGNOSIS — M1711 Unilateral primary osteoarthritis, right knee: Secondary | ICD-10-CM | POA: Diagnosis not present

## 2023-07-26 DIAGNOSIS — Z794 Long term (current) use of insulin: Secondary | ICD-10-CM

## 2023-07-26 DIAGNOSIS — E1159 Type 2 diabetes mellitus with other circulatory complications: Secondary | ICD-10-CM

## 2023-07-26 DIAGNOSIS — R29898 Other symptoms and signs involving the musculoskeletal system: Secondary | ICD-10-CM

## 2023-07-26 LAB — POCT CBG (FASTING - GLUCOSE)-MANUAL ENTRY: Glucose Fasting, POC: 213 mg/dL — AB (ref 70–99)

## 2023-07-26 MED ORDER — GLUCOSE BLOOD VI STRP
ORAL_STRIP | 12 refills | Status: AC
Start: 2023-07-26 — End: ?

## 2023-07-26 MED ORDER — SEMAGLUTIDE (1 MG/DOSE) 4 MG/3ML ~~LOC~~ SOPN: 1.0000 mg | PEN_INJECTOR | SUBCUTANEOUS | 5 refills | Status: DC

## 2023-07-26 MED ORDER — EZETIMIBE 10 MG PO TABS
10.0000 mg | ORAL_TABLET | Freq: Every day | ORAL | 3 refills | Status: AC
Start: 2023-07-26 — End: 2024-07-25

## 2023-07-26 MED ORDER — TRUE METRIX AIR GLUCOSE METER W/DEVICE KIT
1.0000 | PACK | Freq: Three times a day (TID) | 0 refills | Status: AC
Start: 2023-07-26 — End: ?

## 2023-07-26 MED ORDER — FREESTYLE LIBRE 3 READER DEVI: 1.0000 | Freq: Every day | 4 refills | Status: DC

## 2023-07-26 MED ORDER — LANTUS SOLOSTAR 100 UNIT/ML ~~LOC~~ SOPN: 45.0000 [IU] | PEN_INJECTOR | Freq: Every day | SUBCUTANEOUS | 2 refills | Status: DC

## 2023-07-26 MED ORDER — AMLODIPINE BESYLATE 10 MG PO TABS: 10.0000 mg | ORAL_TABLET | Freq: Every day | ORAL | 3 refills | Status: AC

## 2023-07-26 MED ORDER — FARXIGA 10 MG PO TABS: 10.0000 mg | ORAL_TABLET | Freq: Every day | ORAL | 3 refills | Status: AC

## 2023-07-26 MED ORDER — ROSUVASTATIN CALCIUM 40 MG PO TABS: 40.0000 mg | ORAL_TABLET | Freq: Every day | ORAL | 2 refills | Status: AC

## 2023-07-26 NOTE — Progress Notes (Signed)
 Established Patient Office Visit  Subjective:  Patient ID: Danielle Paul, female    DOB: 1955-05-25  Age: 68 y.o. MRN: [id number]  Chief Complaint  Patient presents with   Follow-up    10 day follow up    Patient comes in for follow-up and to discuss her lab results.  She did not bring her medications again today as her bus transit was rushed.  Her most recent hemoglobin A1c has jumped up to 12.6 previous was 6.0.  Her LDL is also high at 188 although she is says that she has been taking her Crestor  40 mg/day regularly, will add Zetia 10 mg/day.  Will consider Repatha if not improved.   Patient admits to dietary noncompliance but she has also run out of several medications.  The refills had been sent but the medicines were not delivered yet.  She will go to the pharmacy and pick them up herself.  Her blood pressure is also elevated but she did not start the new dose amlodipine  as the medicine was not available.  Denies headache or dizziness, no chest pain and no shortness of breath    No other concerns at this time.   Past Medical History:  Diagnosis Date   Anxiety    Coronary artery disease    CVA (cerebral vascular accident) (HCC) 01/2020   Diabetes mellitus without complication (HCC)    Hypercholesteremia    Hypertension    Left-sided weakness    S/P stroke    Past Surgical History:  Procedure Laterality Date   AORTIC VALVE REPLACEMENT (AVR)/CORONARY ARTERY BYPASS GRAFTING (CABG)  2009   CAROTID ANGIOGRAPHY N/A 03/21/2020   Procedure: CAROTID ANGIOGRAPHY;  Surgeon: Celso College, MD;  Location: [facility] INVASIVE CV LAB;  Service: Cardiovascular;  Laterality: N/A;    Social History   Socioeconomic History   Marital status: Married    Spouse name: Not on file   Number of children: Not on file   Years of education: Not on file   Highest education level: Not on file  Occupational History   Not on file  Tobacco Use   Smoking status: Never   Smokeless tobacco: Never  Vaping  Use   Vaping status: Never Used  Substance and Sexual Activity   Alcohol use: Never   Drug use: Never   Sexual activity: Not on file  Other Topics Concern   Not on file  Social History Narrative   Not on file   Social Drivers of Health   Financial Resource Strain: Medium Risk (01/16/2019)   Received from [facility], [facility]   Overall Financial Resource Strain (CARDIA)    Difficulty of Paying Living Expenses: Somewhat hard  Food Insecurity: No Food Insecurity (10/17/2021)   Hunger Vital Sign    Worried About Running Out of Food in the Last Year: Never true    Ran Out of Food in the Last Year: Never true  Transportation Needs: No Transportation Needs (10/17/2021)   PRAPARE - Administrator, Civil Service (Medical): No    Lack of Transportation (Non-Medical): No  Recent Concern: Transportation Needs - Unmet Transportation Needs (10/17/2021)   PRAPARE - Transportation    Lack of Transportation (Medical): Yes    Lack of Transportation (Non-Medical): Yes  Physical Activity: Sufficiently Active (01/16/2019)   Received from [facility], [facility]   Exercise Vital Sign    Days of Exercise per Week: 5 days  Minutes of Exercise per Session: 30 min  Stress: No Stress Concern Present (01/16/2019)   Received from [facility], [facility]   [facility] of Occupational Health - Occupational Stress Questionnaire    Feeling of Stress : Only a little  Social Connections: Moderately Integrated (01/16/2019)   Received from [facility], [facility]   Social Connection and Isolation Panel [NHANES]    Frequency of Communication with Friends and Family: More than three times a week    Frequency of Social Gatherings with Friends and Family: Once a week    Attends Religious Services: More than 4 times per year    Active Member of [employer]  or Organizations: No    Attends Banker Meetings: Never    Marital Status: Married  Catering manager Violence: Not on file    Family History  Problem Relation Age of Onset   Other Mother    Hypertension Mother    Hypertension Father     Allergies  Allergen Reactions   Atorvastatin Other (See Comments)    Muscle Pains   Isosorbide Nitrate Other (See Comments)    Jittery and lightheadedness.   Pravastatin Other (See Comments)   Simvastatin Other (See Comments)    Muscle Pains    Outpatient Medications Prior to Visit  Medication Sig   ASPIRIN  LOW DOSE 81 MG tablet TAKE ONE TABLET BY MOUTH DAILY   carvedilol  (COREG ) 25 MG tablet Take 1 tablet (25 mg total) by mouth 2 (two) times daily.   Cholecalciferol  1.25 MG (50000 UT) capsule Take 1 capsule (50,000 Units total) by mouth once a week.   clopidogrel  (PLAVIX ) 75 MG tablet TAKE ONE TABLET BY MOUTH DAILY   FLUoxetine  (PROZAC ) 20 MG capsule Take 20 mg by mouth daily.   gabapentin  (NEURONTIN ) 100 MG capsule Take 1 capsule (100 mg total) by mouth at bedtime.   glucose blood (TRUE METRIX BLOOD GLUCOSE TEST) test strip 1 each by Other route 3 (three) times daily. Use as instructed   GNP Sterile Lancets 33G MISC USE AS DIRECTED TO CHECK GLUCOSE   HUMALOG  KWIKPEN 100 UNIT/ML KwikPen Inject 7 Units into the skin 3 (three) times daily.   Insulin  Pen Needle 32G X 6 MM MISC 1 each by Does not apply route once a week.   nitroGLYCERIN (NITROSTAT) 0.3 MG SL tablet DISSOLVE 1 TABLET UNDER TONGUE AS NEEDED FOR CHEST PAIN. MAY REPEAT IN 5 MINUTES AS DIRECTED   NOVOLOG  FLEXPEN 100 UNIT/ML FlexPen USE AS DIRECTED INJECT THREE TIMES DAILY UNDER THE SKIN (SUBCUTANEOUSLY) PER SLIDING SCALE MAX OF 30 UNITS DAILY   spironolactone  (ALDACTONE ) 25 MG tablet Take 1 tablet (25 mg total) by mouth daily.   [DISCONTINUED] amLODipine  (NORVASC ) 10 MG tablet Take 1 tablet (10 mg total) by mouth daily.   [DISCONTINUED] Blood Glucose Monitoring Suppl (TRUE  METRIX AIR GLUCOSE METER) w/Device KIT 1 each by Does not apply route 3 (three) times daily.   [DISCONTINUED] FARXIGA  10 MG TABS tablet Take 1 tablet (10 mg total) by mouth daily.   [DISCONTINUED] LANTUS SOLOSTAR 100 UNIT/ML Solostar Pen INJECT 25 UNITS UNDER THE SKIN (SUBCUTANEOUSLY) AT BEDTIME   [DISCONTINUED] rosuvastatin  (CRESTOR ) 40 MG tablet TAKE ONE TABLET BY MOUTH DAILY   [DISCONTINUED] Semaglutide , 1 MG/DOSE, 4 MG/3ML SOPN Inject 1 mg into the skin once a week.   No facility-administered medications prior to visit.    Review of Systems  Constitutional: Negative.  Negative for chills, fever,  malaise/fatigue and weight loss.  HENT: Negative.  Negative for sinus pain and sore throat.   Eyes: Negative.   Respiratory: Negative.  Negative for cough and shortness of breath.   Cardiovascular: Negative.  Negative for chest pain, palpitations and leg swelling.  Gastrointestinal: Negative.  Negative for abdominal pain, constipation, diarrhea, heartburn, nausea and vomiting.  Genitourinary: Negative.  Negative for dysuria and flank pain.  Musculoskeletal: Negative.  Negative for joint pain and myalgias.  Skin: Negative.   Neurological: Negative.  Negative for dizziness, tingling, tremors and headaches.  Endo/Heme/Allergies: Negative.   Psychiatric/Behavioral: Negative.  Negative for depression and suicidal ideas. The patient is not nervous/anxious.        Objective:   BP (!) 154/90   Pulse 69   Ht 5\' 1"  (1.549 m)   SpO2 99%   BMI 31.16 kg/m   Vitals:   07/26/23 1402  BP: (!) 154/90  Pulse: 69  Height: 5\' 1"  (1.549 m)  Weight: Comment: Pt unable to weigh  SpO2: 99%    Physical Exam Vitals and nursing note reviewed.  Constitutional:      Appearance: Normal appearance.  HENT:     Head: Normocephalic and atraumatic.     Nose: Nose normal.     Mouth/Throat:     Mouth: Mucous membranes are moist.     Pharynx: Oropharynx is clear.  Eyes:     Conjunctiva/sclera:  Conjunctivae normal.     Pupils: Pupils are equal, round, and reactive to light.  Cardiovascular:     Rate and Rhythm: Normal rate and regular rhythm.     Pulses: Normal pulses.     Heart sounds: Normal heart sounds. No murmur heard. Pulmonary:     Effort: Pulmonary effort is normal.     Breath sounds: Normal breath sounds. No wheezing.  Abdominal:     General: Bowel sounds are normal.     Palpations: Abdomen is soft.     Tenderness: There is no abdominal tenderness. There is no right CVA tenderness or left CVA tenderness.  Musculoskeletal:        General: Normal range of motion.     Cervical back: Normal range of motion.     Right lower leg: No edema.     Left lower leg: No edema.  Skin:    General: Skin is warm and dry.  Neurological:     General: No focal deficit present.     Mental Status: She is alert and oriented to person, place, and time.  Psychiatric:        Mood and Affect: Mood normal.        Behavior: Behavior normal.      Results for orders placed or performed in visit on 07/26/23  POCT CBG (Fasting - Glucose)  Result Value Ref Range   Glucose Fasting, POC 213 (A) 70 - 99 mg/dL    Recent Results (from the past 2160 hours)  POCT CBG (Fasting - Glucose)     Status: Abnormal   Collection Time: 06/24/23  1:18 PM  Result Value Ref Range   Glucose Fasting, POC 219 (A) 70 - 99 mg/dL  [id number]     Status: Abnormal   Collection Time: 06/24/23  1:45 PM  Result Value Ref Range   Glucose 202 (H) 70 - 99 mg/dL   BUN 17 8 - 27 mg/dL   Creatinine, Ser 5.28 (H) 0.57 - 1.00 mg/dL   eGFR 55 (L) >41 LK/GMW/1.02   BUN/Creatinine Ratio 15 12 - 28  Sodium 138 134 - 144 mmol/L   Potassium 4.1 3.5 - 5.2 mmol/L   Chloride 99 96 - 106 mmol/L   CO2 25 20 - 29 mmol/L   Calcium  9.7 8.7 - 10.3 mg/dL   Total Protein 6.2 6.0 - 8.5 g/dL   Albumin 3.8 (L) 3.9 - 4.9 g/dL   Globulin, Total 2.4 1.5 - 4.5 g/dL   Bilirubin Total 0.2 0.0 - 1.2 mg/dL   Alkaline Phosphatase 104 44 -  121 IU/L   AST 15 0 - 40 IU/L   ALT 11 0 - 32 IU/L  CBC with Diff     Status: None   Collection Time: 06/24/23  1:45 PM  Result Value Ref Range   WBC 5.9 3.4 - 10.8 x10E3/uL   RBC 4.90 3.77 - 5.28 x10E6/uL   Hemoglobin 13.3 11.1 - 15.9 g/dL   Hematocrit 16.1 09.6 - 46.6 %   MCV 84 79 - 97 fL   MCH 27.1 26.6 - 33.0 pg   MCHC 32.2 31.5 - 35.7 g/dL   RDW 04.5 40.9 - 81.1 %   Platelets 245 150 - 450 x10E3/uL   Neutrophils 55 Not Estab. %   Lymphs 33 Not Estab. %   Monocytes 8 Not Estab. %   Eos 3 Not Estab. %   Basos 1 Not Estab. %   Neutrophils Absolute 3.3 1.4 - 7.0 x10E3/uL   Lymphocytes Absolute 1.9 0.7 - 3.1 x10E3/uL   Monocytes Absolute 0.5 0.1 - 0.9 x10E3/uL   EOS (ABSOLUTE) 0.2 0.0 - 0.4 x10E3/uL   Basophils Absolute 0.0 0.0 - 0.2 x10E3/uL   Immature Granulocytes 0 Not Estab. %   Immature Grans (Abs) 0.0 0.0 - 0.1 x10E3/uL  Lipid Panel w/o Chol/HDL Ratio     Status: Abnormal   Collection Time: 06/24/23  1:45 PM  Result Value Ref Range   Cholesterol, Total 251 (H) 100 - 199 mg/dL   Triglycerides 94 0 - 149 mg/dL   HDL 47 >91 mg/dL   VLDL Cholesterol Cal 16 5 - 40 mg/dL   LDL Chol Calc (NIH) 478 (H) 0 - 99 mg/dL  Hemoglobin G9F     Status: Abnormal   Collection Time: 06/24/23  1:45 PM  Result Value Ref Range   Hgb A1c MFr Bld 12.6 (H) 4.8 - 5.6 %    Comment:          Prediabetes: 5.7 - 6.4          Diabetes: >6.4          Glycemic control for adults with diabetes: <7.0    Est. average glucose Bld gHb Est-mCnc 315 mg/dL  POCT CBG (Fasting - Glucose)     Status: Abnormal   Collection Time: 07/08/23  1:12 PM  Result Value Ref Range   Glucose Fasting, POC 202 (A) 70 - 99 mg/dL  POCT CBG (Fasting - Glucose)     Status: Abnormal   Collection Time: 07/26/23  2:30 PM  Result Value Ref Range   Glucose Fasting, POC 213 (A) 70 - 99 mg/dL      Assessment & Plan:  Patient's medications have been refilled again.  Advised to take them regularly.  She will check fingerstick  glucose and bring in record at next visit. Problem List Items Addressed This Visit     Hyperlipidemia   Relevant Medications   amLODipine  (NORVASC ) 10 MG tablet   rosuvastatin  (CRESTOR ) 40 MG tablet   ezetimibe (ZETIA) 10 MG tablet   Type 2  diabetes mellitus with hyperglycemia, with long-term current use of insulin  (HCC)   Relevant Medications   FARXIGA  10 MG TABS tablet   rosuvastatin  (CRESTOR ) 40 MG tablet   insulin  glargine (LANTUS SOLOSTAR) 100 UNIT/ML Solostar Pen   Semaglutide , 1 MG/DOSE, 4 MG/3ML SOPN   Continuous Glucose Receiver (FREESTYLE LIBRE 3 READER) DEVI   Blood Glucose Monitoring Suppl (TRUE METRIX AIR GLUCOSE METER) w/Device KIT   Other Relevant Orders   POCT CBG (Fasting - Glucose) (Completed)   Other Visit Diagnoses       Hypertension associated with diabetes (HCC)       Relevant Medications   amLODipine  (NORVASC ) 10 MG tablet   FARXIGA  10 MG TABS tablet   rosuvastatin  (CRESTOR ) 40 MG tablet   ezetimibe (ZETIA) 10 MG tablet   insulin  glargine (LANTUS SOLOSTAR) 100 UNIT/ML Solostar Pen   Semaglutide , 1 MG/DOSE, 4 MG/3ML SOPN       Return in about 3 weeks (around 08/16/2023).   Total time spent: 30 minutes  Aisha Hove, MD  07/26/2023   This document may have been prepared by [facility] Voice Recognition software and as such may include unintentional dictation errors.

## 2023-07-28 ENCOUNTER — Other Ambulatory Visit: Payer: Self-pay | Admitting: Internal Medicine

## 2023-07-28 MED ORDER — BLOOD GLUCOSE MONITOR KIT: PACK | 0 refills | Status: AC

## 2023-08-10 ENCOUNTER — Telehealth: Payer: Self-pay

## 2023-08-10 MED ORDER — ONETOUCH DELICA PLUS LANCET30G MISC: 3 refills | Status: AC

## 2023-08-10 MED ORDER — ONETOUCH VERIO VI STRP: ORAL_STRIP | 3 refills | Status: DC

## 2023-08-10 MED ORDER — ONETOUCH VERIO FLEX SYSTEM DEVI: 1.0000 | 0 refills | Status: AC

## 2023-08-10 NOTE — Progress Notes (Signed)
   08/10/2023  Patient ID: Danielle Paul, female   DOB: Sep 11, 1955, 68 y.o.   MRN: [id number]  Attempted to contact patient to follow up on diabetic meter and supplies. Left HIPAA compliant message for patient to return my call at their convenience.    Carnell Christian, PharmD Clinical Pharmacist [phone]

## 2023-08-16 ENCOUNTER — Ambulatory Visit: Payer: Self-pay | Admitting: Internal Medicine

## 2023-08-16 ENCOUNTER — Ambulatory Visit (INDEPENDENT_AMBULATORY_CARE_PROVIDER_SITE_OTHER): Admitting: Internal Medicine

## 2023-08-16 ENCOUNTER — Encounter: Payer: Self-pay | Admitting: Internal Medicine

## 2023-08-16 VITALS — BP 114/76 | HR 65 | Ht 61.0 in

## 2023-08-16 DIAGNOSIS — E1159 Type 2 diabetes mellitus with other circulatory complications: Secondary | ICD-10-CM

## 2023-08-16 DIAGNOSIS — G8194 Hemiplegia, unspecified affecting left nondominant side: Secondary | ICD-10-CM

## 2023-08-16 DIAGNOSIS — E782 Mixed hyperlipidemia: Secondary | ICD-10-CM | POA: Diagnosis not present

## 2023-08-16 DIAGNOSIS — I25718 Atherosclerosis of autologous vein coronary artery bypass graft(s) with other forms of angina pectoris: Secondary | ICD-10-CM

## 2023-08-16 DIAGNOSIS — E1169 Type 2 diabetes mellitus with other specified complication: Secondary | ICD-10-CM

## 2023-08-16 DIAGNOSIS — Z794 Long term (current) use of insulin: Secondary | ICD-10-CM

## 2023-08-16 DIAGNOSIS — E1165 Type 2 diabetes mellitus with hyperglycemia: Secondary | ICD-10-CM | POA: Diagnosis not present

## 2023-08-16 DIAGNOSIS — I152 Hypertension secondary to endocrine disorders: Secondary | ICD-10-CM

## 2023-08-16 LAB — POCT CBG (FASTING - GLUCOSE)-MANUAL ENTRY: Glucose Fasting, POC: 102 mg/dL — AB (ref 70–99)

## 2023-08-16 NOTE — Progress Notes (Signed)
 Established Patient Office Visit  Subjective:  Patient ID: Danielle Paul, female    DOB: 1956/02/17  Age: 68 y.o. MRN: [id number]  Chief Complaint  Patient presents with   Follow-up    3 week follow up    Patient comes in for a follow-up today.  Her blood pressure and blood sugars are both looking good.  She is not taking her medications regularly.  She has no new complaints today.  She had a few low blood  sugar readings so advised to cut back her Lantus  to 38 units at bedtime.  Will continue to use the Humalog  sliding scale as needed.    No other concerns at this time.   Past Medical History:  Diagnosis Date   Anxiety    Coronary artery disease    CVA (cerebral vascular accident) (HCC) 01/2020   Diabetes mellitus without complication (HCC)    Hypercholesteremia    Hypertension    Left-sided weakness    S/P stroke    Past Surgical History:  Procedure Laterality Date   AORTIC VALVE REPLACEMENT (AVR)/CORONARY ARTERY BYPASS GRAFTING (CABG)  2009   CAROTID ANGIOGRAPHY N/A 03/21/2020   Procedure: CAROTID ANGIOGRAPHY;  Surgeon: Marea Selinda RAMAN, MD;  Location: [facility] INVASIVE CV LAB;  Service: Cardiovascular;  Laterality: N/A;    Social History   Socioeconomic History   Marital status: Married    Spouse name: Not on file   Number of children: Not on file   Years of education: Not on file   Highest education level: Not on file  Occupational History   Not on file  Tobacco Use   Smoking status: Never   Smokeless tobacco: Never  Vaping Use   Vaping status: Never Used  Substance and Sexual Activity   Alcohol use: Never   Drug use: Never   Sexual activity: Not on file  Other Topics Concern   Not on file  Social History Narrative   Not on file   Social Drivers of Health   Financial Resource Strain: Medium Risk (01/16/2019)   Received from [facility]   Overall Financial Resource Strain (CARDIA)    Difficulty of Paying Living Expenses: Somewhat hard   Food Insecurity: No Food Insecurity (10/17/2021)   Hunger Vital Sign    Worried About Running Out of Food in the Last Year: Never true    Ran Out of Food in the Last Year: Never true  Transportation Needs: No Transportation Needs (10/17/2021)   PRAPARE - Administrator, Civil Service (Medical): No    Lack of Transportation (Non-Medical): No  Recent Concern: Transportation Needs - Unmet Transportation Needs (10/17/2021)   PRAPARE - Transportation    Lack of Transportation (Medical): Yes    Lack of Transportation (Non-Medical): Yes  Physical Activity: Sufficiently Active (01/16/2019)   Received from [facility]   Exercise Vital Sign    Days of Exercise per Week: 5 days    Minutes of Exercise per Session: 30 min  Stress: No Stress Concern Present (01/16/2019)   Received from [facility] - Occupational Stress Questionnaire    Feeling of Stress : Only a little  Social Connections: Moderately Integrated (01/16/2019)   Received from [facility]   Social Connection and Isolation Panel    Frequency of Communication with Friends and Family: More than three times a week    Frequency of Social Gatherings with Friends and Family: Once  a week    Attends Religious Services: More than 4 times per year    Active Member of Clubs or Organizations: No    Attends Banker Meetings: Never    Marital Status: Married  Catering manager Violence: Not on file    Family History  Problem Relation Age of Onset   Other Mother    Hypertension Mother    Hypertension Father     Allergies  Allergen Reactions   Atorvastatin Other (See Comments)    Muscle Pains   Isosorbide Nitrate Other (See Comments)    Jittery and lightheadedness.   Pravastatin Other (See Comments)   Simvastatin Other (See Comments)    Muscle Pains    Outpatient Medications Prior to Visit  Medication Sig    amLODipine  (NORVASC ) 10 MG tablet Take 1 tablet (10 mg total) by mouth daily.   ASPIRIN  LOW DOSE 81 MG tablet TAKE ONE TABLET BY MOUTH DAILY   blood glucose meter kit and supplies KIT Dispense based on patient and insurance preference. Use up to four times daily as directed.   Blood Glucose Monitoring Suppl (ONETOUCH VERIO FLEX SYSTEM) DEVI 1 each by Does not apply route as directed. Use to check blood sugar up to 4 times daily. Dx: E11.65 Please substitute to any brand preferred by insurance   Blood Glucose Monitoring Suppl (TRUE METRIX AIR GLUCOSE METER) w/Device KIT 1 each by Does not apply route 3 (three) times daily.   carvedilol  (COREG ) 25 MG tablet Take 1 tablet (25 mg total) by mouth 2 (two) times daily.   Cholecalciferol  1.25 MG (50000 UT) capsule Take 1 capsule (50,000 Units total) by mouth once a week.   clopidogrel  (PLAVIX ) 75 MG tablet TAKE ONE TABLET BY MOUTH DAILY   Continuous Glucose Receiver (FREESTYLE LIBRE 3 READER) DEVI 1 each by Does not apply route daily.   ezetimibe  (ZETIA ) 10 MG tablet Take 1 tablet (10 mg total) by mouth daily.   FARXIGA  10 MG TABS tablet Take 1 tablet (10 mg total) by mouth daily.   FLUoxetine  (PROZAC ) 20 MG capsule Take 20 mg by mouth daily.   gabapentin  (NEURONTIN ) 100 MG capsule Take 1 capsule (100 mg total) by mouth at bedtime.   glucose blood (ONETOUCH VERIO) test strip Use to check blood sugar up to four times daily as directed. Dx: E11.65 Please substitute to any brand preferred by insurance.   GNP Sterile Lancets 33G MISC USE AS DIRECTED TO CHECK GLUCOSE   HUMALOG  KWIKPEN 100 UNIT/ML KwikPen Inject 7 Units into the skin 3 (three) times daily.   insulin  glargine (LANTUS  SOLOSTAR) 100 UNIT/ML Solostar Pen Inject 45 Units into the skin at bedtime.   Insulin  Pen Needle 32G X 6 MM MISC 1 each by Does not apply route once a week.   Lancets (ONETOUCH DELICA PLUS LANCET30G) MISC Use to check blood sugar up to four times daily as directed. Dx: E11.65 Please  substitute to any brand preferred by insurance.   nitroGLYCERIN (NITROSTAT) 0.3 MG SL tablet DISSOLVE 1 TABLET UNDER TONGUE AS NEEDED FOR CHEST PAIN. MAY REPEAT IN 5 MINUTES AS DIRECTED   NOVOLOG  FLEXPEN 100 UNIT/ML FlexPen USE AS DIRECTED INJECT THREE TIMES DAILY UNDER THE SKIN (SUBCUTANEOUSLY) PER SLIDING SCALE MAX OF 30 UNITS DAILY   rosuvastatin  (CRESTOR ) 40 MG tablet Take 1 tablet (40 mg total) by mouth daily.   Semaglutide , 1 MG/DOSE, 4 MG/3ML SOPN Inject 1 mg into the skin once a week.   spironolactone  (ALDACTONE ) 25 MG  tablet Take 1 tablet (25 mg total) by mouth daily.   No facility-administered medications prior to visit.    Review of Systems  Constitutional: Negative.  Negative for chills, fever, malaise/fatigue and weight loss.  HENT: Negative.  Negative for sore throat.   Eyes: Negative.   Respiratory: Negative.  Negative for cough and shortness of breath.   Cardiovascular: Negative.  Negative for chest pain, palpitations and leg swelling.  Gastrointestinal: Negative.  Negative for abdominal pain, constipation, diarrhea, heartburn, nausea and vomiting.  Genitourinary: Negative.  Negative for dysuria and flank pain.  Musculoskeletal: Negative.  Negative for joint pain and myalgias.  Skin: Negative.   Neurological:  Positive for weakness. Negative for dizziness, tingling, tremors and headaches.  Endo/Heme/Allergies: Negative.   Psychiatric/Behavioral: Negative.  Negative for depression and suicidal ideas. The patient is not nervous/anxious.        Objective:   BP 114/76   Pulse 65   Ht 5' 1 (1.549 m)   SpO2 99%   BMI 31.16 kg/m   Vitals:   08/16/23 1327  BP: 114/76  Pulse: 65  Height: 5' 1 (1.549 m)  Weight: Comment: Patient unable to weigh  SpO2: 99%    Physical Exam Vitals and nursing note reviewed.  Constitutional:      Appearance: Normal appearance.  HENT:     Head: Normocephalic and atraumatic.     Nose: Nose normal.     Mouth/Throat:     Mouth:  Mucous membranes are moist.     Pharynx: Oropharynx is clear.   Eyes:     Conjunctiva/sclera: Conjunctivae normal.     Pupils: Pupils are equal, round, and reactive to light.    Cardiovascular:     Rate and Rhythm: Normal rate and regular rhythm.     Pulses: Normal pulses.     Heart sounds: Normal heart sounds. No murmur heard. Pulmonary:     Effort: Pulmonary effort is normal.     Breath sounds: Normal breath sounds. No wheezing.  Abdominal:     General: Bowel sounds are normal.     Palpations: Abdomen is soft.     Tenderness: There is no abdominal tenderness. There is no right CVA tenderness or left CVA tenderness.   Musculoskeletal:        General: Normal range of motion.     Cervical back: Normal range of motion.     Right lower leg: No edema.     Left lower leg: No edema.   Skin:    General: Skin is warm and dry.   Neurological:     Mental Status: She is alert and oriented to person, place, and time.   Psychiatric:        Mood and Affect: Mood normal.        Behavior: Behavior normal.      Results for orders placed or performed in visit on 08/16/23  POCT CBG (Fasting - Glucose)  Result Value Ref Range   Glucose Fasting, POC 102 (A) 70 - 99 mg/dL    Recent Results (from the past 2160 hours)  POCT CBG (Fasting - Glucose)     Status: Abnormal   Collection Time: 06/24/23  1:18 PM  Result Value Ref Range   Glucose Fasting, POC 219 (A) 70 - 99 mg/dL  [id number]     Status: Abnormal   Collection Time: 06/24/23  1:45 PM  Result Value Ref Range   Glucose 202 (H) 70 - 99 mg/dL   BUN 17 8 -  27 mg/dL   Creatinine, Ser 8.89 (H) 0.57 - 1.00 mg/dL   eGFR 55 (L) >40 fO/fpw/8.26   BUN/Creatinine Ratio 15 12 - 28   Sodium 138 134 - 144 mmol/L   Potassium 4.1 3.5 - 5.2 mmol/L   Chloride 99 96 - 106 mmol/L   CO2 25 20 - 29 mmol/L   Calcium  9.7 8.7 - 10.3 mg/dL   Total Protein 6.2 6.0 - 8.5 g/dL   Albumin 3.8 (L) 3.9 - 4.9 g/dL   Globulin, Total 2.4 1.5 - 4.5 g/dL    Bilirubin Total 0.2 0.0 - 1.2 mg/dL   Alkaline Phosphatase 104 44 - 121 IU/L   AST 15 0 - 40 IU/L   ALT 11 0 - 32 IU/L  CBC with Diff     Status: None   Collection Time: 06/24/23  1:45 PM  Result Value Ref Range   WBC 5.9 3.4 - 10.8 x10E3/uL   RBC 4.90 3.77 - 5.28 x10E6/uL   Hemoglobin 13.3 11.1 - 15.9 g/dL   Hematocrit 58.6 65.9 - 46.6 %   MCV 84 79 - 97 fL   MCH 27.1 26.6 - 33.0 pg   MCHC 32.2 31.5 - 35.7 g/dL   RDW 86.8 88.2 - 84.5 %   Platelets 245 150 - 450 x10E3/uL   Neutrophils 55 Not Estab. %   Lymphs 33 Not Estab. %   Monocytes 8 Not Estab. %   Eos 3 Not Estab. %   Basos 1 Not Estab. %   Neutrophils Absolute 3.3 1.4 - 7.0 x10E3/uL   Lymphocytes Absolute 1.9 0.7 - 3.1 x10E3/uL   Monocytes Absolute 0.5 0.1 - 0.9 x10E3/uL   EOS (ABSOLUTE) 0.2 0.0 - 0.4 x10E3/uL   Basophils Absolute 0.0 0.0 - 0.2 x10E3/uL   Immature Granulocytes 0 Not Estab. %   Immature Grans (Abs) 0.0 0.0 - 0.1 x10E3/uL  Lipid Panel w/o Chol/HDL Ratio     Status: Abnormal   Collection Time: 06/24/23  1:45 PM  Result Value Ref Range   Cholesterol, Total 251 (H) 100 - 199 mg/dL   Triglycerides 94 0 - 149 mg/dL   HDL 47 >60 mg/dL   VLDL Cholesterol Cal 16 5 - 40 mg/dL   LDL Chol Calc (NIH) 811 (H) 0 - 99 mg/dL  Hemoglobin J8r     Status: Abnormal   Collection Time: 06/24/23  1:45 PM  Result Value Ref Range   Hgb A1c MFr Bld 12.6 (H) 4.8 - 5.6 %    Comment:          Prediabetes: 5.7 - 6.4          Diabetes: >6.4          Glycemic control for adults with diabetes: <7.0    Est. average glucose Bld gHb Est-mCnc 315 mg/dL  POCT CBG (Fasting - Glucose)     Status: Abnormal   Collection Time: 07/08/23  1:12 PM  Result Value Ref Range   Glucose Fasting, POC 202 (A) 70 - 99 mg/dL  POCT CBG (Fasting - Glucose)     Status: Abnormal   Collection Time: 07/26/23  2:30 PM  Result Value Ref Range   Glucose Fasting, POC 213 (A) 70 - 99 mg/dL  POCT CBG (Fasting - Glucose)     Status: Abnormal   Collection  Time: 08/16/23  1:32 PM  Result Value Ref Range   Glucose Fasting, POC 102 (A) 70 - 99 mg/dL      Assessment & Plan:  Continue all medications at home.  Monitor blood pressure and blood sugars.  Will return in 6 weeks and we will check her blood work. Problem List Items Addressed This Visit     Coronary artery disease of autologous vein bypass graft with stable angina pectoris (HCC)   Type 2 diabetes mellitus with hyperglycemia, with long-term current use of insulin  (HCC)   Relevant Orders   POCT CBG (Fasting - Glucose) (Completed)   Left hemiparesis (HCC)   Other Visit Diagnoses       Hypertension associated with diabetes (HCC)    -  Primary     Combined hyperlipidemia associated with type 2 diabetes mellitus (HCC)           Return in about 6 weeks (around 09/27/2023).   Total time spent: 30 minutes  FERNAND FREDY RAMAN, MD  08/16/2023   This document may have been prepared by [facility] Voice Recognition software and as such may include unintentional dictation errors.

## 2023-08-24 ENCOUNTER — Telehealth: Payer: Self-pay | Admitting: Internal Medicine

## 2023-08-24 NOTE — Telephone Encounter (Signed)
 Left VM to inform us  that they have attempted to reach patient several times about her CGM and have been unsuccessful.

## 2023-09-27 ENCOUNTER — Ambulatory Visit (INDEPENDENT_AMBULATORY_CARE_PROVIDER_SITE_OTHER): Admitting: Cardiology

## 2023-09-27 ENCOUNTER — Encounter: Payer: Self-pay | Admitting: Cardiology

## 2023-09-27 VITALS — BP 118/88 | HR 59 | Ht 61.0 in

## 2023-09-27 DIAGNOSIS — N183 Chronic kidney disease, stage 3 unspecified: Secondary | ICD-10-CM | POA: Diagnosis not present

## 2023-09-27 DIAGNOSIS — D509 Iron deficiency anemia, unspecified: Secondary | ICD-10-CM

## 2023-09-27 DIAGNOSIS — E559 Vitamin D deficiency, unspecified: Secondary | ICD-10-CM

## 2023-09-27 DIAGNOSIS — I10 Essential (primary) hypertension: Secondary | ICD-10-CM | POA: Diagnosis not present

## 2023-09-27 DIAGNOSIS — E119 Type 2 diabetes mellitus without complications: Secondary | ICD-10-CM

## 2023-09-27 DIAGNOSIS — E782 Mixed hyperlipidemia: Secondary | ICD-10-CM

## 2023-09-27 DIAGNOSIS — Z1329 Encounter for screening for other suspected endocrine disorder: Secondary | ICD-10-CM | POA: Diagnosis not present

## 2023-09-27 DIAGNOSIS — Z7689 Persons encountering health services in other specified circumstances: Secondary | ICD-10-CM | POA: Insufficient documentation

## 2023-09-27 NOTE — Progress Notes (Signed)
 Established Patient Office Visit  Subjective:  Patient ID: Danielle Paul, female    DOB: 08/21/1955  Age: 68 y.o. MRN: [id number]  Chief Complaint  Patient presents with   Follow-up    Patient in office for follow up, no new complaints today. Did not have blood work done. Fasting, will get blood work today.  Patient needing a mobility assessment for a power wheel chair. Patient has a right sided deficit previous CVA.  Patient is here today for a face-to-face mobility assessment.   She needs a Museum/gallery exhibitions officer to improve her mobility and complete her ADL's in a timely fashion.  She has trouble with eating, bathing, toileting and needs the PWC to remain independent in her home.   She has tried an cane and a walker, but continues to fall as she's tripping over things due to her right sided deficits from a prior CVA. Additionally, she does not have the upper body strength to be able to propel a manual wheelchair.   Pt is both mentally and physically able to use power wheeler chair.    No other concerns at this time.   Past Medical History:  Diagnosis Date   Anxiety    Coronary artery disease    CVA (cerebral vascular accident) (HCC) 01/2020   Diabetes mellitus without complication (HCC)    Hypercholesteremia    Hypertension    Left-sided weakness    S/P stroke    Past Surgical History:  Procedure Laterality Date   AORTIC VALVE REPLACEMENT (AVR)/CORONARY ARTERY BYPASS GRAFTING (CABG)  2009   CAROTID ANGIOGRAPHY N/A 03/21/2020   Procedure: CAROTID ANGIOGRAPHY;  Surgeon: Marea Selinda RAMAN, MD;  Location: [facility] INVASIVE CV LAB;  Service: Cardiovascular;  Laterality: N/A;    Social History   Socioeconomic History   Marital status: Married    Spouse name: Not on file   Number of children: Not on file   Years of education: Not on file   Highest education level: Not on file  Occupational History   Not on file  Tobacco Use   Smoking status: Never   Smokeless tobacco: Never  Vaping  Use   Vaping status: Never Used  Substance and Sexual Activity   Alcohol use: Never   Drug use: Never   Sexual activity: Not on file  Other Topics Concern   Not on file  Social History Narrative   Not on file   Social Drivers of Health   Financial Resource Strain: Medium Risk (01/16/2019)   Received from [facility]   Overall Financial Resource Strain (CARDIA)    Difficulty of Paying Living Expenses: Somewhat hard  Food Insecurity: No Food Insecurity (10/17/2021)   Hunger Vital Sign    Worried About Running Out of Food in the Last Year: Never true    Ran Out of Food in the Last Year: Never true  Transportation Needs: No Transportation Needs (10/17/2021)   PRAPARE - Administrator, Civil Service (Medical): No    Lack of Transportation (Non-Medical): No  Recent Concern: Transportation Needs - Unmet Transportation Needs (10/17/2021)   PRAPARE - Transportation    Lack of Transportation (Medical): Yes    Lack of Transportation (Non-Medical): Yes  Physical Activity: Sufficiently Active (01/16/2019)   Received from [facility]   Exercise Vital Sign    Days of Exercise per Week: 5 days    Minutes of Exercise per Session: 30 min  Stress: No Stress Concern Present (01/16/2019)   Received  from [facility]   [employer] of Occupational Health - Occupational Stress Questionnaire    Feeling of Stress : Only a little  Social Connections: Moderately Integrated (01/16/2019)   Received from [facility]   Social Connection and Isolation Panel    Frequency of Communication with Friends and Family: More than three times a week    Frequency of Social Gatherings with Friends and Family: Once a week    Attends Religious Services: More than 4 times per year    Active Member of [employer] or Organizations: No    Attends Banker Meetings: Never    Marital Status: Married  Catering manager Violence: Not  on file    Family History  Problem Relation Age of Onset   Other Mother    Hypertension Mother    Hypertension Father     Allergies  Allergen Reactions   Atorvastatin Other (See Comments)    Muscle Pains   Isosorbide Nitrate Other (See Comments)    Jittery and lightheadedness.   Pravastatin Other (See Comments)   Simvastatin Other (See Comments)    Muscle Pains    Outpatient Medications Prior to Visit  Medication Sig   amLODipine  (NORVASC ) 10 MG tablet Take 1 tablet (10 mg total) by mouth daily.   ASPIRIN  LOW DOSE 81 MG tablet TAKE ONE TABLET BY MOUTH DAILY   blood glucose meter kit and supplies KIT Dispense based on patient and insurance preference. Use up to four times daily as directed.   Blood Glucose Monitoring Suppl (ONETOUCH VERIO FLEX SYSTEM) DEVI 1 each by Does not apply route as directed. Use to check blood sugar up to 4 times daily. Dx: E11.65 Please substitute to any brand preferred by insurance   Blood Glucose Monitoring Suppl (TRUE METRIX AIR GLUCOSE METER) w/Device KIT 1 each by Does not apply route 3 (three) times daily.   carvedilol  (COREG ) 25 MG tablet Take 1 tablet (25 mg total) by mouth 2 (two) times daily.   Cholecalciferol  1.25 MG (50000 UT) capsule Take 1 capsule (50,000 Units total) by mouth once a week.   clopidogrel  (PLAVIX ) 75 MG tablet TAKE ONE TABLET BY MOUTH DAILY   Continuous Glucose Receiver (FREESTYLE LIBRE 3 READER) DEVI 1 each by Does not apply route daily.   ezetimibe  (ZETIA ) 10 MG tablet Take 1 tablet (10 mg total) by mouth daily.   FARXIGA  10 MG TABS tablet Take 1 tablet (10 mg total) by mouth daily.   FLUoxetine  (PROZAC ) 20 MG capsule Take 20 mg by mouth daily.   gabapentin  (NEURONTIN ) 100 MG capsule Take 1 capsule (100 mg total) by mouth at bedtime.   glucose blood (ONETOUCH VERIO) test strip Use to check blood sugar up to four times daily as directed. Dx: E11.65 Please substitute to any brand preferred by insurance.   GNP Sterile Lancets  33G MISC USE AS DIRECTED TO CHECK GLUCOSE   HUMALOG  KWIKPEN 100 UNIT/ML KwikPen Inject 7 Units into the skin 3 (three) times daily.   insulin  glargine (LANTUS  SOLOSTAR) 100 UNIT/ML Solostar Pen Inject 45 Units into the skin at bedtime.   Insulin  Pen Needle 32G X 6 MM MISC 1 each by Does not apply route once a week.   Lancets (ONETOUCH DELICA PLUS LANCET30G) MISC Use to check blood sugar up to four times daily as directed. Dx: E11.65 Please substitute to any brand preferred by insurance.   nitroGLYCERIN (NITROSTAT) 0.3 MG SL tablet DISSOLVE 1 TABLET UNDER TONGUE AS NEEDED  FOR CHEST PAIN. MAY REPEAT IN 5 MINUTES AS DIRECTED   NOVOLOG  FLEXPEN 100 UNIT/ML FlexPen USE AS DIRECTED INJECT THREE TIMES DAILY UNDER THE SKIN (SUBCUTANEOUSLY) PER SLIDING SCALE MAX OF 30 UNITS DAILY   rosuvastatin  (CRESTOR ) 40 MG tablet Take 1 tablet (40 mg total) by mouth daily.   Semaglutide , 1 MG/DOSE, 4 MG/3ML SOPN Inject 1 mg into the skin once a week.   spironolactone  (ALDACTONE ) 25 MG tablet Take 1 tablet (25 mg total) by mouth daily.   No facility-administered medications prior to visit.    Review of Systems  Constitutional: Negative.   HENT: Negative.    Eyes: Negative.   Respiratory: Negative.  Negative for shortness of breath.   Cardiovascular: Negative.  Negative for chest pain.  Gastrointestinal: Negative.  Negative for abdominal pain, constipation and diarrhea.  Genitourinary: Negative.   Musculoskeletal:  Negative for joint pain and myalgias.  Skin: Negative.   Neurological: Negative.  Negative for dizziness and headaches.  Endo/Heme/Allergies: Negative.   All other systems reviewed and are negative.      Objective:   BP 118/88   Pulse (!) 59   Ht 5' 1 (1.549 m)   SpO2 99%   BMI 31.16 kg/m   Vitals:   09/27/23 1312  BP: 118/88  Pulse: (!) 59  Height: 5' 1 (1.549 m)  SpO2: 99%    Physical Exam Vitals and nursing note reviewed.  Constitutional:      Appearance: Normal appearance.  She is normal weight.  HENT:     Head: Normocephalic and atraumatic.     Nose: Nose normal.     Mouth/Throat:     Mouth: Mucous membranes are moist.  Eyes:     Extraocular Movements: Extraocular movements intact.     Conjunctiva/sclera: Conjunctivae normal.     Pupils: Pupils are equal, round, and reactive to light.  Cardiovascular:     Rate and Rhythm: Normal rate and regular rhythm.     Pulses: Normal pulses.     Heart sounds: Normal heart sounds.  Pulmonary:     Effort: Pulmonary effort is normal.     Breath sounds: Normal breath sounds.  Abdominal:     General: Abdomen is flat. Bowel sounds are normal.     Palpations: Abdomen is soft.  Musculoskeletal:        General: Normal range of motion.     Cervical back: Normal range of motion.  Skin:    General: Skin is warm and dry.  Neurological:     General: No focal deficit present.     Mental Status: She is alert and oriented to person, place, and time.     Motor: Weakness present.     Coordination: Coordination abnormal.     Gait: Gait abnormal and tandem walk abnormal.     Comments: LUE strength: 2/5 RUE strength: 0/5 LLE strength: 2/5 RLE strength: 0/5     Psychiatric:        Mood and Affect: Mood normal.        Behavior: Behavior normal.        Thought Content: Thought content normal.        Judgment: Judgment normal.      No results found for any visits on 09/27/23.  Recent Results (from the past 2160 hours)  POCT CBG (Fasting - Glucose)     Status: Abnormal   Collection Time: 07/08/23  1:12 PM  Result Value Ref Range   Glucose Fasting, POC 202 (A) 70 -  99 mg/dL  POCT CBG (Fasting - Glucose)     Status: Abnormal   Collection Time: 07/26/23  2:30 PM  Result Value Ref Range   Glucose Fasting, POC 213 (A) 70 - 99 mg/dL  POCT CBG (Fasting - Glucose)     Status: Abnormal   Collection Time: 08/16/23  1:32 PM  Result Value Ref Range   Glucose Fasting, POC 102 (A) 70 - 99 mg/dL      Assessment & Plan:   Fasting lab work today Mobility assessment complete  Problem List Items Addressed This Visit       Cardiovascular and Mediastinum   Essential hypertension, benign - Primary   Relevant Orders   CMP14+EGFR     Endocrine   Diabetes mellitus without complication (HCC)   Relevant Orders   CMP14+EGFR   Hemoglobin A1c     Genitourinary   CKD (chronic kidney disease) stage 3, GFR 30-59 ml/min (HCC)     Other   Hyperlipidemia   Relevant Orders   Lipid Profile   Iron deficiency anemia   Relevant Orders   CBC with Differential/Platelet   Vitamin D  deficiency   Relevant Orders   Vitamin D  (25 hydroxy)   Encounter for power mobility device assessment   Other Visit Diagnoses       Thyroid disorder screening       Relevant Orders   TSH       Return in about 2 weeks (around 10/11/2023) for with NK.   Total time spent: 35 minutes  Shermaine Brigham, NP  09/27/2023   This document may have been prepared by Dragon Voice Recognition software and as such may include unintentional dictation errors.

## 2023-09-28 ENCOUNTER — Ambulatory Visit: Payer: Self-pay | Admitting: Cardiology

## 2023-09-28 LAB — CBC WITH DIFFERENTIAL/PLATELET
Basophils Absolute: 0.1 x10E3/uL (ref 0.0–0.2)
Basos: 1 %
EOS (ABSOLUTE): 0.2 x10E3/uL (ref 0.0–0.4)
Eos: 4 %
Hematocrit: 41.2 % (ref 34.0–46.6)
Hemoglobin: 12.4 g/dL (ref 11.1–15.9)
Immature Grans (Abs): 0 x10E3/uL (ref 0.0–0.1)
Immature Granulocytes: 0 %
Lymphocytes Absolute: 1.7 x10E3/uL (ref 0.7–3.1)
Lymphs: 30 %
MCH: 26.5 pg — ABNORMAL LOW (ref 26.6–33.0)
MCHC: 30.1 g/dL — ABNORMAL LOW (ref 31.5–35.7)
MCV: 88 fL (ref 79–97)
Monocytes Absolute: 0.4 x10E3/uL (ref 0.1–0.9)
Monocytes: 8 %
Neutrophils Absolute: 3.1 x10E3/uL (ref 1.4–7.0)
Neutrophils: 57 %
Platelets: 259 x10E3/uL (ref 150–450)
RBC: 4.68 x10E6/uL (ref 3.77–5.28)
RDW: 14.6 % (ref 11.7–15.4)
WBC: 5.5 x10E3/uL (ref 3.4–10.8)

## 2023-09-28 LAB — CMP14+EGFR
ALT: 18 IU/L (ref 0–32)
AST: 21 IU/L (ref 0–40)
Albumin: 3.9 g/dL (ref 3.9–4.9)
Alkaline Phosphatase: 121 IU/L (ref 44–121)
BUN/Creatinine Ratio: 15 (ref 12–28)
BUN: 16 mg/dL (ref 8–27)
Bilirubin Total: 0.3 mg/dL (ref 0.0–1.2)
CO2: 21 mmol/L (ref 20–29)
Calcium: 9.2 mg/dL (ref 8.7–10.3)
Chloride: 103 mmol/L (ref 96–106)
Creatinine, Ser: 1.06 mg/dL — ABNORMAL HIGH (ref 0.57–1.00)
Globulin, Total: 2.4 g/dL (ref 1.5–4.5)
Glucose: 203 mg/dL — ABNORMAL HIGH (ref 70–99)
Potassium: 4.5 mmol/L (ref 3.5–5.2)
Sodium: 142 mmol/L (ref 134–144)
Total Protein: 6.3 g/dL (ref 6.0–8.5)
eGFR: 58 mL/min/1.73 — ABNORMAL LOW (ref >59)

## 2023-09-28 LAB — LIPID PANEL
Chol/HDL Ratio: 2.9 ratio (ref 0.0–4.4)
Cholesterol, Total: 155 mg/dL (ref 100–199)
HDL: 53 mg/dL (ref >39)
LDL Chol Calc (NIH): 91 mg/dL (ref 0–99)
Triglycerides: 51 mg/dL (ref 0–149)
VLDL Cholesterol Cal: 11 mg/dL (ref 5–40)

## 2023-09-28 LAB — VITAMIN D 25 HYDROXY (VIT D DEFICIENCY, FRACTURES): Vit D, 25-Hydroxy: 36.6 ng/mL (ref 30.0–100.0)

## 2023-09-28 LAB — TSH: TSH: 2.07 u[IU]/mL (ref 0.450–4.500)

## 2023-09-28 LAB — HEMOGLOBIN A1C
Est. average glucose Bld gHb Est-mCnc: 183 mg/dL
Hgb A1c MFr Bld: 8 % — ABNORMAL HIGH (ref 4.8–5.6)

## 2023-09-30 DIAGNOSIS — E1165 Type 2 diabetes mellitus with hyperglycemia: Secondary | ICD-10-CM | POA: Diagnosis not present

## 2023-10-12 NOTE — Progress Notes (Signed)
   10/12/2023  Patient ID: Danielle Paul, female   DOB: 03/27/1955, 68 y.o.   MRN: [id number]  Pharmacy Quality Measure Review  This patient is appearing on a report for being at risk of failing the adherence measure for cholesterol (statin) medications this calendar year.   Medication: Rosuvastatin  40mg  Last fill date: 07/26/23 for 90 day supply  Insurance report was not up to date. No action needed at this time.    Jon VEAR Lindau, PharmD Clinical Pharmacist [phone]

## 2023-10-15 ENCOUNTER — Encounter: Payer: Self-pay | Admitting: Internal Medicine

## 2023-10-15 ENCOUNTER — Ambulatory Visit: Payer: Self-pay | Admitting: Internal Medicine

## 2023-10-15 ENCOUNTER — Ambulatory Visit (INDEPENDENT_AMBULATORY_CARE_PROVIDER_SITE_OTHER): Admitting: Internal Medicine

## 2023-10-15 VITALS — BP 130/80 | HR 69 | Ht 61.0 in

## 2023-10-15 DIAGNOSIS — E1169 Type 2 diabetes mellitus with other specified complication: Secondary | ICD-10-CM | POA: Diagnosis not present

## 2023-10-15 DIAGNOSIS — Z794 Long term (current) use of insulin: Secondary | ICD-10-CM

## 2023-10-15 DIAGNOSIS — E1165 Type 2 diabetes mellitus with hyperglycemia: Secondary | ICD-10-CM | POA: Diagnosis not present

## 2023-10-15 DIAGNOSIS — G8194 Hemiplegia, unspecified affecting left nondominant side: Secondary | ICD-10-CM | POA: Diagnosis not present

## 2023-10-15 DIAGNOSIS — I25110 Atherosclerotic heart disease of native coronary artery with unstable angina pectoris: Secondary | ICD-10-CM

## 2023-10-15 DIAGNOSIS — I152 Hypertension secondary to endocrine disorders: Secondary | ICD-10-CM | POA: Diagnosis not present

## 2023-10-15 DIAGNOSIS — M1711 Unilateral primary osteoarthritis, right knee: Secondary | ICD-10-CM | POA: Diagnosis not present

## 2023-10-15 DIAGNOSIS — E782 Mixed hyperlipidemia: Secondary | ICD-10-CM | POA: Diagnosis not present

## 2023-10-15 DIAGNOSIS — E1159 Type 2 diabetes mellitus with other circulatory complications: Secondary | ICD-10-CM | POA: Diagnosis not present

## 2023-10-15 LAB — POCT CBG (FASTING - GLUCOSE)-MANUAL ENTRY: Glucose Fasting, POC: 141 mg/dL — AB (ref 70–99)

## 2023-10-15 LAB — POC CREATINE & ALBUMIN,URINE
Creatinine, POC: 100 mg/dL
Microalbumin Ur, POC: 150 mg/L

## 2023-10-15 NOTE — Progress Notes (Signed)
 Established Patient Office Visit  Subjective:  Patient ID: Danielle Paul, female    DOB: 19-Jan-1956  Age: 68 y.o. MRN: [id number]  Chief Complaint  Patient presents with   Follow-up    2 week follow up    Patient comes in for follow-up and to discuss her lab results.  She is generally feeling well today.  And her hemoglobin A1c shows an improvement , it is down to 8.0 from 12.6.  Patient is taking all her medications including Ozempic  1 mg/week.  Denies nausea or vomiting no chest pain, and no palpitations.  Patient advised to continue taking all her medications and such and monitor her blood sugars at home. She is still waiting for a motorized wheelchair for her impaired mobility.    No other concerns at this time.   Past Medical History:  Diagnosis Date   Anxiety    Coronary artery disease    CVA (cerebral vascular accident) (HCC) 01/2020   Diabetes mellitus without complication (HCC)    Hypercholesteremia    Hypertension    Left-sided weakness    S/P stroke    Past Surgical History:  Procedure Laterality Date   AORTIC VALVE REPLACEMENT (AVR)/CORONARY ARTERY BYPASS GRAFTING (CABG)  2009   CAROTID ANGIOGRAPHY N/A 03/21/2020   Procedure: CAROTID ANGIOGRAPHY;  Surgeon: Marea Selinda RAMAN, MD;  Location: [facility] INV[facility]IVE [facility];  Service: Cardiovascular;  Laterality: N/A;    Social History   Socioeconomic History   Marital status: Married    Spouse name: Not on file   Number of children: Not on file   Years of education: Not on file   Highest education level: Not on file  Occupational History   Not on file  Tobacco Use   Smoking status: Never   Smokeless tobacco: Never  Vaping Use   Vaping status: Never Used  Substance and Sexual Activity   Alcohol use: Never   Drug use: Never   Sexual activity: Not on file  Other Topics Concern   Not on file  Social History Narrative   Not on file   Social Drivers of Health   Financial Resource Strain: Medium Risk (01/16/2019)    Received from [facility]   Overall Financial Resource Strain (CARDIA)    Difficulty of Paying Living Expenses: Somewhat hard  Food Insecurity: No Food Insecurity (10/17/2021)   Hunger Vital Sign    Worried About Running Out of Food in the Last Year: Never true    Ran Out of Food in the Last Year: Never true  Transportation Needs: No Transportation Needs (10/17/2021)   PRAPARE - Administrator, Civil Service (Medical): No    Lack of Transportation (Non-Medical): No  Recent Concern: Transportation Needs - Unmet Transportation Needs (10/17/2021)   PRAPARE - Transportation    Lack of Transportation (Medical): Yes    Lack of Transportation (Non-Medical): Yes  Physical Activity: Sufficiently Active (01/16/2019)   Received from [facility]   Exercise Vital Sign    Days of Exercise per Week: 5 days    Minutes of Exercise per Session: 30 min  Stress: No Stress Concern Present (01/16/2019)   Received from [facility] - Occupational Stress Questionnaire    Feeling of Stress : Only a little  Social Connections: Moderately Integrated (01/16/2019)   Received from [facility]   Social Connection and Isolation Panel    Frequency of Communication with Friends  and Family: More than three times a week    Frequency of Social Gatherings with Friends and Family: Once a week    Attends Religious Services: More than 4 times per year    Active Member of [employer] or Organizations: No    Attends Banker Meetings: Never    Marital Status: Married  Catering manager Violence: Not on file    Family History  Problem Relation Age of Onset   Other Mother    Hypertension Mother    Hypertension Father     Allergies  Allergen Reactions   Atorvastatin Other (See Comments)    Muscle Pains   Isosorbide Nitrate Other (See Comments)    Jittery and lightheadedness.   Pravastatin  Other (See Comments)   Simvastatin Other (See Comments)    Muscle Pains    Outpatient Medications Prior to Visit  Medication Sig   amLODipine  (NORVASC ) 10 MG tablet Take 1 tablet (10 mg total) by mouth daily.   ASPIRIN  LOW DOSE 81 MG tablet TAKE ONE TABLET BY MOUTH DAILY   blood glucose meter kit and supplies KIT Dispense based on patient and insurance preference. Use up to four times daily as directed.   Blood Glucose Monitoring Suppl (ONETOUCH VERIO FLEX SYSTEM) DEVI 1 each by Does not apply route as directed. Use to check blood sugar up to 4 times daily. Dx: E11.65 Please substitute to any brand preferred by insurance   Blood Glucose Monitoring Suppl (TRUE METRIX AIR GLUCOSE METER) w/Device KIT 1 each by Does not apply route 3 (three) times daily.   carvedilol  (COREG ) 25 MG tablet Take 1 tablet (25 mg total) by mouth 2 (two) times daily.   Cholecalciferol  1.25 MG (50000 UT) capsule Take 1 capsule (50,000 Units total) by mouth once a week.   clopidogrel  (PLAVIX ) 75 MG tablet TAKE ONE TABLET BY MOUTH DAILY   Continuous Glucose Receiver (FREESTYLE LIBRE 3 READER) DEVI 1 each by Does not apply route daily.   ezetimibe  (ZETIA ) 10 MG tablet Take 1 tablet (10 mg total) by mouth daily.   FARXIGA  10 MG TABS tablet Take 1 tablet (10 mg total) by mouth daily.   FLUoxetine  (PROZAC ) 20 MG capsule Take 20 mg by mouth daily.   gabapentin  (NEURONTIN ) 100 MG capsule Take 1 capsule (100 mg total) by mouth at bedtime.   glucose blood (ONETOUCH VERIO) test strip Use to check blood sugar up to four times daily as directed. Dx: E11.65 Please substitute to any brand preferred by insurance.   GNP Sterile Lancets 33G MISC USE AS DIRECTED TO CHECK GLUCOSE   HUMALOG  KWIKPEN 100 UNIT/ML KwikPen Inject 7 Units into the skin 3 (three) times daily.   insulin  glargine (LANTUS  SOLOSTAR) 100 UNIT/ML Solostar Pen Inject 45 Units into the skin at bedtime.   Insulin  Pen Needle 32G X 6 MM MISC 1 each by Does not apply route  once a week.   Lancets (ONETOUCH DELICA PLUS LANCET30G) MISC Use to check blood sugar up to four times daily as directed. Dx: E11.65 Please substitute to any brand preferred by insurance.   nitroGLYCERIN (NITROSTAT) 0.3 MG SL tablet DISSOLVE 1 TABLET UNDER TONGUE AS NEEDED FOR CHEST PAIN. MAY REPEAT IN 5 MINUTES AS DIRECTED   NOVOLOG  FLEXPEN 100 UNIT/ML FlexPen USE AS DIRECTED INJECT THREE TIMES DAILY UNDER THE SKIN (SUBCUTANEOUSLY) PER SLIDING SCALE MAX OF 30 UNITS DAILY   rosuvastatin  (CRESTOR ) 40 MG tablet Take 1 tablet (40 mg total) by mouth daily.   Semaglutide ,  1 MG/DOSE, 4 MG/3ML SOPN Inject 1 mg into the skin once a week.   spironolactone  (ALDACTONE ) 25 MG tablet Take 1 tablet (25 mg total) by mouth daily.   No facility-administered medications prior to visit.    Review of Systems  Constitutional: Negative.  Negative for chills, fever, malaise/fatigue and weight loss.  HENT: Negative.  Negative for sinus pain.   Eyes: Negative.   Respiratory: Negative.  Negative for cough and shortness of breath.   Cardiovascular: Negative.  Negative for chest pain, palpitations and leg swelling.  Gastrointestinal: Negative.  Negative for abdominal pain, constipation, diarrhea, heartburn, nausea and vomiting.  Genitourinary: Negative.  Negative for dysuria and flank pain.  Musculoskeletal: Negative.  Negative for joint pain and myalgias.  Skin: Negative.   Neurological:  Positive for weakness. Negative for dizziness, tingling, tremors, sensory change and headaches.  Endo/Heme/Allergies: Negative.   Psychiatric/Behavioral: Negative.  Negative for depression and suicidal ideas. The patient is not nervous/anxious.        Objective:   BP 130/80   Pulse 69   Ht 5' 1 (1.549 m)   SpO2 99%   BMI 31.16 kg/m   Vitals:   10/15/23 1342  BP: 130/80  Pulse: 69  Height: 5' 1 (1.549 m)  Weight: Comment: pt unable to weigh  SpO2: 99%    Physical Exam Vitals and nursing note reviewed.   Constitutional:      Appearance: Normal appearance.  HENT:     Head: Normocephalic and atraumatic.     Nose: Nose normal.     Mouth/Throat:     Mouth: Mucous membranes are moist.     Pharynx: Oropharynx is clear.  Eyes:     Conjunctiva/sclera: Conjunctivae normal.     Pupils: Pupils are equal, round, and reactive to light.  Cardiovascular:     Rate and Rhythm: Normal rate and regular rhythm.     Pulses: Normal pulses.     Heart sounds: Normal heart sounds. No murmur heard. Pulmonary:     Effort: Pulmonary effort is normal.     Breath sounds: Normal breath sounds. No wheezing.  Abdominal:     General: Bowel sounds are normal.     Palpations: Abdomen is soft.     Tenderness: There is no abdominal tenderness. There is no right CVA tenderness or left CVA tenderness.  Musculoskeletal:        General: Normal range of motion.     Cervical back: Normal range of motion.     Right lower leg: No edema.     Left lower leg: No edema.  Skin:    General: Skin is warm and dry.  Neurological:     General: No focal deficit present.     Mental Status: She is alert and oriented to person, place, and time.  Psychiatric:        Mood and Affect: Mood normal.        Behavior: Behavior normal.      Results for orders placed or performed in visit on 10/15/23  POCT CBG (Fasting - Glucose)  Result Value Ref Range   Glucose Fasting, POC 141 (A) 70 - 99 mg/dL  POC CREATINE & ALBUMIN,URINE  Result Value Ref Range   Microalbumin Ur, POC 150 mg/L   Creatinine, POC 100 mg/dL   Albumin/Creatinine Ratio, Urine, POC 30-300     Recent Results (from the past 2160 hours)  POCT CBG (Fasting - Glucose)     Status: Abnormal   Collection Time: 07/26/23  2:30 PM  Result Value Ref Range   Glucose Fasting, POC 213 (A) 70 - 99 mg/dL  POCT CBG (Fasting - Glucose)     Status: Abnormal   Collection Time: 08/16/23  1:32 PM  Result Value Ref Range   Glucose Fasting, POC 102 (A) 70 - 99 mg/dL  [id number]      Status: Abnormal   Collection Time: 09/27/23  1:35 PM  Result Value Ref Range   Glucose 203 (H) 70 - 99 mg/dL   BUN 16 8 - 27 mg/dL   Creatinine, Ser 8.93 (H) 0.57 - 1.00 mg/dL   eGFR 58 (L) >40 fO/fpw/8.26   BUN/Creatinine Ratio 15 12 - 28   Sodium 142 134 - 144 mmol/L   Potassium 4.5 3.5 - 5.2 mmol/L   Chloride 103 96 - 106 mmol/L   CO2 21 20 - 29 mmol/L   Calcium  9.2 8.7 - 10.3 mg/dL   Total Protein 6.3 6.0 - 8.5 g/dL   Albumin 3.9 3.9 - 4.9 g/dL   Globulin, Total 2.4 1.5 - 4.5 g/dL   Bilirubin Total 0.3 0.0 - 1.2 mg/dL   Alkaline Phosphatase 121 44 - 121 IU/L   AST 21 0 - 40 IU/L   ALT 18 0 - 32 IU/L  Lipid Profile     Status: None   Collection Time: 09/27/23  1:35 PM  Result Value Ref Range   Cholesterol, Total 155 100 - 199 mg/dL   Triglycerides 51 0 - 149 mg/dL   HDL 53 >60 mg/dL   VLDL Cholesterol Cal 11 5 - 40 mg/dL   LDL Chol Calc (NIH) 91 0 - 99 mg/dL   Chol/HDL Ratio 2.9 0.0 - 4.4 ratio    Comment:                                   T. Chol/HDL Ratio                                             Men  Women                               1/2 Avg.Risk  3.4    3.3                                   Avg.Risk  5.0    4.4                                2X Avg.Risk  9.6    7.1                                3X Avg.Risk 23.4   11.0   Hemoglobin A1c     Status: Abnormal   Collection Time: 09/27/23  1:35 PM  Result Value Ref Range   Hgb A1c MFr Bld 8.0 (H) 4.8 - 5.6 %    Comment:          Prediabetes: 5.7 - 6.4          Diabetes: >6.4  Glycemic control for adults with diabetes: <7.0    Est. average glucose Bld gHb Est-mCnc 183 mg/dL  TSH     Status: None   Collection Time: 09/27/23  1:35 PM  Result Value Ref Range   TSH 2.070 0.450 - 4.500 uIU/mL  CBC with Differential/Platelet     Status: Abnormal   Collection Time: 09/27/23  1:35 PM  Result Value Ref Range   WBC 5.5 3.4 - 10.8 x10E3/uL   RBC 4.68 3.77 - 5.28 x10E6/uL   Hemoglobin 12.4 11.1 - 15.9 g/dL    Hematocrit 58.7 65.9 - 46.6 %   MCV 88 79 - 97 fL   MCH 26.5 (L) 26.6 - 33.0 pg   MCHC 30.1 (L) 31.5 - 35.7 g/dL   RDW 85.3 88.2 - 84.5 %   Platelets 259 150 - 450 x10E3/uL   Neutrophils 57 Not Estab. %   Lymphs 30 Not Estab. %   Monocytes 8 Not Estab. %   Eos 4 Not Estab. %   Basos 1 Not Estab. %   Neutrophils Absolute 3.1 1.4 - 7.0 x10E3/uL   Lymphocytes Absolute 1.7 0.7 - 3.1 x10E3/uL   Monocytes Absolute 0.4 0.1 - 0.9 x10E3/uL   EOS (ABSOLUTE) 0.2 0.0 - 0.4 x10E3/uL   Basophils Absolute 0.1 0.0 - 0.2 x10E3/uL   Immature Granulocytes 0 Not Estab. %   Immature Grans (Abs) 0.0 0.0 - 0.1 x10E3/uL  Vitamin D  (25 hydroxy)     Status: None   Collection Time: 09/27/23  1:35 PM  Result Value Ref Range   Vit D, 25-Hydroxy 36.6 30.0 - 100.0 ng/mL    Comment: Vitamin D  deficiency has been defined by the Institute of Medicine and an Endocrine Society practice guideline as a level of serum 25-OH vitamin D  less than 20 ng/mL (1,2). The Endocrine Society went on to further define vitamin D  insufficiency as a level between 21 and 29 ng/mL (2). 1. IOM (Institute of Medicine). 2010. Dietary reference    intakes for calcium  and D. [place]: The    [employer]. 2. Holick MF, Binkley Coral, Bischoff-Ferrari HA, et al.    Evaluation, treatment, and prevention of vitamin D     deficiency: an Endocrine Society clinical practice    guideline. JCEM. 2011 Jul; 96(7):1911-30.   POCT CBG (Fasting - Glucose)     Status: Abnormal   Collection Time: 10/15/23  1:47 PM  Result Value Ref Range   Glucose Fasting, POC 141 (A) 70 - 99 mg/dL  POC CREATINE & ALBUMIN,URINE     Status: Abnormal   Collection Time: 10/15/23  2:21 PM  Result Value Ref Range   Microalbumin Ur, POC 150 mg/L   Creatinine, POC 100 mg/dL   Albumin/Creatinine Ratio, Urine, POC 30-300       Assessment & Plan:  Continue all her medications.  Strict diet control emphasized.  Close monitoring of her medications.  Check  urine micro albumin today. Problem List Items Addressed This Visit     CAD (coronary artery disease), native coronary artery   Type 2 diabetes mellitus with hyperglycemia, with long-term current use of insulin  (HCC) - Primary   Relevant Orders   POCT CBG (Fasting - Glucose) (Completed)   POC CREATINE & ALBUMIN,URINE (Completed)   Left hemiparesis (HCC)   Primary osteoarthritis of right knee   Combined hyperlipidemia associated with type 2 diabetes mellitus (HCC)   Hypertension associated with diabetes (HCC)    Follow up 3 months.   Total time spent:  30 minutes  FERNAND FREDY RAMAN, MD  10/15/2023   This document may have been prepared by [facility] Voice Recognition software and as such may include unintentional dictation errors.

## 2023-10-31 DIAGNOSIS — E1165 Type 2 diabetes mellitus with hyperglycemia: Secondary | ICD-10-CM | POA: Diagnosis not present

## 2023-11-12 ENCOUNTER — Telehealth: Payer: Self-pay

## 2023-11-12 NOTE — Progress Notes (Signed)
   11/12/2023  Patient ID: Danielle Paul, female   DOB: [birth year], 68 y.o.   MRN: [id number]  Pharmacy Quality Measure Review  This patient is appearing on a report for being at risk of failing the adherence measure for cholesterol (statin) medications this calendar year.   Medication: Rosuvastatin  40mg  Last fill date: 07/26/23 for 90 day supply  Attempted to contact patient to discuss. Unable to leave voicemail.  Jon VEAR Lindau, PharmD Clinical Pharmacist [phone]

## 2023-11-29 ENCOUNTER — Telehealth: Payer: Self-pay | Admitting: Internal Medicine

## 2023-11-29 NOTE — Telephone Encounter (Signed)
 Danielle Paul, social with with [facility] called in to see if Dr. Fernand has any concerns about patient's appointment compliance, concerns of cognition, and medication compliance. Please advise if you have any of these concerns and I will reach back out to the social worker.

## 2023-11-30 ENCOUNTER — Telehealth: Payer: Self-pay

## 2023-11-30 DIAGNOSIS — E1165 Type 2 diabetes mellitus with hyperglycemia: Secondary | ICD-10-CM | POA: Diagnosis not present

## 2023-11-30 NOTE — Progress Notes (Signed)
   11/30/2023  Patient ID: Danielle Paul, female   DOB: Dec 18, 1955, 68 y.o.   MRN: [id number]  Pharmacy Quality Measure Review  This patient is appearing on a report for being at risk of failing the adherence measure for cholesterol (statin) medications this calendar year.   Medication: Rosuvastatin  Last fill date: 07/26/23 for 90 day supply  Attempted to contact patient to discuss. Unable to leave voicemail.  Jon VEAR Lindau, PharmD Clinical Pharmacist [phone]

## 2023-12-02 ENCOUNTER — Telehealth: Payer: Self-pay | Admitting: Internal Medicine

## 2023-12-02 NOTE — Telephone Encounter (Signed)
Patient left VM requesting a call back. Did not state what she needs.

## 2023-12-03 ENCOUNTER — Telehealth: Payer: Self-pay

## 2023-12-03 NOTE — Progress Notes (Signed)
   12/03/2023  Patient ID: Danielle Paul, female   DOB: May 20, 1955, 68 y.o.   MRN: [id number]  Pharmacy Quality Measure Review  This patient is appearing on a report for being at risk of failing the adherence measure for cholesterol (statin) medications this calendar year.   Medication: Rosuvastatin  Last fill date: 07/26/23 for 90 day supply  Attempted to contact patient to discuss. Unable to leave voicemail.  Jon VEAR Lindau, PharmD Clinical Pharmacist [phone]

## 2023-12-08 ENCOUNTER — Telehealth: Payer: Self-pay | Admitting: Internal Medicine

## 2023-12-08 NOTE — Telephone Encounter (Signed)
 [name], Nurse Care Manager with [employer], left VM asking for a call back to discuss concerns she has about this patient.  Callback # [phone]

## 2023-12-23 NOTE — Telephone Encounter (Signed)
 Spoke with [name] and they are concerned about patient because they cannot reach her via phone and the patient has told them before she needs more assistance in the home. They are trying to reach her to provide resources for her and help her get the assistance but they cannot reach her.  [name] [phone]

## 2023-12-24 ENCOUNTER — Encounter: Payer: Self-pay | Admitting: Internal Medicine

## 2023-12-24 ENCOUNTER — Ambulatory Visit: Admitting: Internal Medicine

## 2023-12-24 VITALS — BP 130/88 | HR 79 | Ht 61.0 in

## 2023-12-24 DIAGNOSIS — Z794 Long term (current) use of insulin: Secondary | ICD-10-CM

## 2023-12-24 DIAGNOSIS — E1169 Type 2 diabetes mellitus with other specified complication: Secondary | ICD-10-CM | POA: Diagnosis not present

## 2023-12-24 DIAGNOSIS — E1159 Type 2 diabetes mellitus with other circulatory complications: Secondary | ICD-10-CM | POA: Diagnosis not present

## 2023-12-24 DIAGNOSIS — I25110 Atherosclerotic heart disease of native coronary artery with unstable angina pectoris: Secondary | ICD-10-CM

## 2023-12-24 DIAGNOSIS — G8194 Hemiplegia, unspecified affecting left nondominant side: Secondary | ICD-10-CM

## 2023-12-24 DIAGNOSIS — E782 Mixed hyperlipidemia: Secondary | ICD-10-CM | POA: Diagnosis not present

## 2023-12-24 DIAGNOSIS — E1165 Type 2 diabetes mellitus with hyperglycemia: Secondary | ICD-10-CM | POA: Diagnosis not present

## 2023-12-24 DIAGNOSIS — N1831 Chronic kidney disease, stage 3a: Secondary | ICD-10-CM | POA: Diagnosis not present

## 2023-12-24 DIAGNOSIS — I152 Hypertension secondary to endocrine disorders: Secondary | ICD-10-CM | POA: Diagnosis not present

## 2023-12-24 DIAGNOSIS — E113211 Type 2 diabetes mellitus with mild nonproliferative diabetic retinopathy with macular edema, right eye: Secondary | ICD-10-CM

## 2023-12-24 DIAGNOSIS — I5022 Chronic systolic (congestive) heart failure: Secondary | ICD-10-CM

## 2023-12-24 LAB — POCT CBG (FASTING - GLUCOSE)-MANUAL ENTRY: Glucose Fasting, POC: 204 mg/dL — AB (ref 70–99)

## 2023-12-24 NOTE — Progress Notes (Signed)
 Established Patient Office Visit  Subjective:  Patient ID: Danielle Paul, female    DOB: [birth year]  Age: 68 y.o. MRN: [id number]  Chief Complaint  Patient presents with   Follow-up    FL2 form    Patient made this appointment to have FL2 form filled out. Currently she is stays at her home with her husband being her caregiver. She was getting some home health with Pt for Left Hemiparesis. However now her husband is having major health issues , and his guardian has started nursing home placements - for both of them. Patient is tearful because she wants to stay in her home , but cannot do so alone , and also her husband is the one responsible for finances. Her cousin's husband who was present, Valere, helped with some clarification of the situation. FL2 filled out. Patient will return for her original appointment as scheduled.    No other concerns at this time.   Past Medical History:  Diagnosis Date   Anxiety    Coronary artery disease    CVA (cerebral vascular accident) (HCC) 01/2020   Diabetes mellitus without complication (HCC)    Hypercholesteremia    Hypertension    Left-sided weakness    S/P stroke    Past Surgical History:  Procedure Laterality Date   AORTIC VALVE REPLACEMENT (AVR)/CORONARY ARTERY BYPASS GRAFTING (CABG)  2009   CAROTID ANGIOGRAPHY N/A 03/21/2020   Procedure: CAROTID ANGIOGRAPHY;  Surgeon: Marea Selinda RAMAN, MD;  Location: [facility] INVASIVE CV [facility];  Service: Cardiovascular;  Laterality: N/A;    Social History   Socioeconomic History   Marital status: Married    Spouse name: Not on file   Number of children: Not on file   Years of education: Not on file   Highest education level: Not on file  Occupational History   Not on file  Tobacco Use   Smoking status: Never   Smokeless tobacco: Never  Vaping Use   Vaping status: Never Used  Substance and Sexual Activity   Alcohol use: Never   Drug use: Never   Sexual activity: Not on file  Other Topics  Concern   Not on file  Social History Narrative   Not on file   Social Drivers of Health   Financial Resource Strain: Medium Risk (01/16/2019)   Received from [facility]   Overall Financial Resource Strain (CARDIA)    Difficulty of Paying Living Expenses: Somewhat hard  Food Insecurity: No Food Insecurity (10/17/2021)   Hunger Vital Sign    Worried About Running Out of Food in the Last Year: Never true    Ran Out of Food in the Last Year: Never true  Transportation Needs: No Transportation Needs (10/17/2021)   PRAPARE - Administrator, Civil Service (Medical): No    Lack of Transportation (Non-Medical): No  Recent Concern: Transportation Needs - Unmet Transportation Needs (10/17/2021)   PRAPARE - Transportation    Lack of Transportation (Medical): Yes    Lack of Transportation (Non-Medical): Yes  Physical Activity: Sufficiently Active (01/16/2019)   Received from [facility]   Exercise Vital Sign    Days of Exercise per Week: 5 days    Minutes of Exercise per Session: 30 min  Stress: No Stress Concern Present (01/16/2019)   Received from [facility] - Occupational Stress Questionnaire    Feeling of Stress : Only a little  Social Connections: Moderately Integrated (01/16/2019)  Received from [facility]   Social Connection and Isolation Panel    Frequency of Communication with Friends and Family: More than three times a week    Frequency of Social Gatherings with Friends and Family: Once a week    Attends Religious Services: More than 4 times per year    Active Member of [employer] or Organizations: No    Attends Banker Meetings: Never    Marital Status: Married  Catering Manager Violence: Not on file    Family History  Problem Relation Age of Onset   Other Mother    Hypertension Mother    Hypertension Father     Allergies  Allergen  Reactions   Atorvastatin Other (See Comments)    Muscle Pains   Isosorbide Nitrate Other (See Comments)    Jittery and lightheadedness.   Pravastatin Other (See Comments)   Simvastatin Other (See Comments)    Muscle Pains    Outpatient Medications Prior to Visit  Medication Sig   amLODipine  (NORVASC ) 10 MG tablet Take 1 tablet (10 mg total) by mouth daily.   ASPIRIN  LOW DOSE 81 MG tablet TAKE ONE TABLET BY MOUTH DAILY   blood glucose meter kit and supplies KIT Dispense based on patient and insurance preference. Use up to four times daily as directed.   Blood Glucose Monitoring Suppl (ONETOUCH VERIO FLEX SYSTEM) DEVI 1 each by Does not apply route as directed. Use to check blood sugar up to 4 times daily. Dx: E11.65 Please substitute to any brand preferred by insurance   Blood Glucose Monitoring Suppl (TRUE METRIX AIR GLUCOSE METER) w/Device KIT 1 each by Does not apply route 3 (three) times daily.   carvedilol  (COREG ) 25 MG tablet Take 1 tablet (25 mg total) by mouth 2 (two) times daily.   Cholecalciferol  1.25 MG (50000 UT) capsule Take 1 capsule (50,000 Units total) by mouth once a week.   clopidogrel  (PLAVIX ) 75 MG tablet TAKE ONE TABLET BY MOUTH DAILY   Continuous Glucose Receiver (FREESTYLE LIBRE 3 READER) DEVI 1 each by Does not apply route daily.   ezetimibe  (ZETIA ) 10 MG tablet Take 1 tablet (10 mg total) by mouth daily.   FARXIGA  10 MG TABS tablet Take 1 tablet (10 mg total) by mouth daily.   FLUoxetine  (PROZAC ) 20 MG capsule Take 20 mg by mouth daily.   gabapentin  (NEURONTIN ) 100 MG capsule Take 1 capsule (100 mg total) by mouth at bedtime.   glucose blood (ONETOUCH VERIO) test strip Use to check blood sugar up to four times daily as directed. Dx: E11.65 Please substitute to any brand preferred by insurance.   GNP Sterile Lancets 33G MISC USE AS DIRECTED TO CHECK GLUCOSE   HUMALOG  KWIKPEN 100 UNIT/ML KwikPen Inject 7 Units into the skin 3 (three) times daily.   insulin  glargine  (LANTUS  SOLOSTAR) 100 UNIT/ML Solostar Pen Inject 45 Units into the skin at bedtime.   Insulin  Pen Needle 32G X 6 MM MISC 1 each by Does not apply route once a week.   Lancets (ONETOUCH DELICA PLUS LANCET30G) MISC Use to check blood sugar up to four times daily as directed. Dx: E11.65 Please substitute to any brand preferred by insurance.   nitroGLYCERIN (NITROSTAT) 0.3 MG SL tablet DISSOLVE 1 TABLET UNDER TONGUE AS NEEDED FOR CHEST PAIN. MAY REPEAT IN 5 MINUTES AS DIRECTED   NOVOLOG  FLEXPEN 100 UNIT/ML FlexPen USE AS DIRECTED INJECT THREE TIMES DAILY UNDER THE SKIN (SUBCUTANEOUSLY) PER SLIDING SCALE MAX OF 30  UNITS DAILY   rosuvastatin  (CRESTOR ) 40 MG tablet Take 1 tablet (40 mg total) by mouth daily.   Semaglutide , 1 MG/DOSE, 4 MG/3ML SOPN Inject 1 mg into the skin once a week.   spironolactone  (ALDACTONE ) 25 MG tablet Take 1 tablet (25 mg total) by mouth daily.   No facility-administered medications prior to visit.    Review of Systems  Constitutional: Negative.  Negative for chills, fever and malaise/fatigue.  HENT: Negative.  Negative for congestion and sore throat.   Eyes: Negative.  Negative for blurred vision and pain.  Respiratory: Negative.  Negative for cough and shortness of breath.   Cardiovascular: Negative.  Negative for chest pain, palpitations and leg swelling.  Gastrointestinal: Negative.  Negative for abdominal pain, blood in stool, constipation, diarrhea, heartburn, melena, nausea and vomiting.  Genitourinary: Negative.  Negative for dysuria, flank pain, frequency and urgency.  Musculoskeletal: Negative.  Negative for joint pain and myalgias.  Skin: Negative.   Neurological: Negative.  Negative for dizziness, tingling, sensory change, weakness and headaches.  Endo/Heme/Allergies: Negative.   Psychiatric/Behavioral: Negative.  Negative for depression and suicidal ideas. The patient is not nervous/anxious.        Objective:   BP 130/88   Pulse 79   Ht 5' 1 (1.549  m)   SpO2 98%   BMI 31.16 kg/m   Vitals:   12/24/23 1315  BP: 130/88  Pulse: 79  Height: 5' 1 (1.549 m)  Weight: Comment: patient unable to weigh  SpO2: 98%    Physical Exam Vitals and nursing note reviewed.  Constitutional:      Appearance: Normal appearance.  HENT:     Head: Normocephalic and atraumatic.     Nose: Nose normal.     Mouth/Throat:     Mouth: Mucous membranes are moist.     Pharynx: Oropharynx is clear.  Eyes:     Conjunctiva/sclera: Conjunctivae normal.     Pupils: Pupils are equal, round, and reactive to light.  Cardiovascular:     Rate and Rhythm: Normal rate and regular rhythm.     Pulses: Normal pulses.     Heart sounds: Normal heart sounds. No murmur heard. Pulmonary:     Effort: Pulmonary effort is normal.     Breath sounds: Normal breath sounds. No wheezing.  Abdominal:     General: Bowel sounds are normal.     Palpations: Abdomen is soft.     Tenderness: There is no abdominal tenderness. There is no right CVA tenderness or left CVA tenderness.  Musculoskeletal:        General: Normal range of motion.     Cervical back: Normal range of motion.     Right lower leg: No edema.     Left lower leg: No edema.  Skin:    General: Skin is warm and dry.  Neurological:     General: No focal deficit present.     Mental Status: She is alert and oriented to person, place, and time.  Psychiatric:        Mood and Affect: Mood normal.        Behavior: Behavior normal.      Results for orders placed or performed in visit on 12/24/23  POCT CBG (Fasting - Glucose)  Result Value Ref Range   Glucose Fasting, POC 204 (A) 70 - 99 mg/dL    Recent Results (from the past 2160 hours)  CMP14+EGFR     Status: Abnormal   Collection Time: 09/27/23  1:35 PM  Result Value Ref Range   Glucose 203 (H) 70 - 99 mg/dL   BUN 16 8 - 27 mg/dL   Creatinine, Ser 8.93 (H) 0.57 - 1.00 mg/dL   eGFR 58 (L) >40 fO/fpw/8.26   BUN/Creatinine Ratio 15 12 - 28   Sodium 142  134 - 144 mmol/L   Potassium 4.5 3.5 - 5.2 mmol/L   Chloride 103 96 - 106 mmol/L   CO2 21 20 - 29 mmol/L   Calcium  9.2 8.7 - 10.3 mg/dL   Total Protein 6.3 6.0 - 8.5 g/dL   Albumin 3.9 3.9 - 4.9 g/dL   Globulin, Total 2.4 1.5 - 4.5 g/dL   Bilirubin Total 0.3 0.0 - 1.2 mg/dL   Alkaline Phosphatase 121 44 - 121 IU/L   AST 21 0 - 40 IU/L   ALT 18 0 - 32 IU/L  Lipid Profile     Status: None   Collection Time: 09/27/23  1:35 PM  Result Value Ref Range   Cholesterol, Total 155 100 - 199 mg/dL   Triglycerides 51 0 - 149 mg/dL   HDL 53 >60 mg/dL   VLDL Cholesterol Cal 11 5 - 40 mg/dL   LDL Chol Calc (NIH) 91 0 - 99 mg/dL   Chol/HDL Ratio 2.9 0.0 - 4.4 ratio    Comment:                                   T. Chol/HDL Ratio                                             Men  Women                               1/2 Avg.Risk  3.4    3.3                                   Avg.Risk  5.0    4.4                                2X Avg.Risk  9.6    7.1                                3X Avg.Risk 23.4   11.0   Hemoglobin A1c     Status: Abnormal   Collection Time: 09/27/23  1:35 PM  Result Value Ref Range   Hgb A1c MFr Bld 8.0 (H) 4.8 - 5.6 %    Comment:          Prediabetes: 5.7 - 6.4          Diabetes: >6.4          Glycemic control for adults with diabetes: <7.0    Est. average glucose Bld gHb Est-mCnc 183 mg/dL  TSH     Status: None   Collection Time: 09/27/23  1:35 PM  Result Value Ref Range   TSH 2.070 0.450 - 4.500 uIU/mL  CBC with Differential/Platelet     Status: Abnormal   Collection Time: 09/27/23  1:35 PM  Result Value Ref Range   WBC 5.5 3.4 - 10.8 x10E3/uL   RBC 4.68 3.77 - 5.28 x10E6/uL   Hemoglobin 12.4 11.1 - 15.9 g/dL   Hematocrit 58.7 65.9 - 46.6 %   MCV 88 79 - 97 fL   MCH 26.5 (L) 26.6 - 33.0 pg   MCHC 30.1 (L) 31.5 - 35.7 g/dL   RDW 85.3 88.2 - 84.5 %   Platelets 259 150 - 450 x10E3/uL   Neutrophils 57 Not Estab. %   Lymphs 30 Not Estab. %   Monocytes 8 Not Estab. %    Eos 4 Not Estab. %   Basos 1 Not Estab. %   Neutrophils Absolute 3.1 1.4 - 7.0 x10E3/uL   Lymphocytes Absolute 1.7 0.7 - 3.1 x10E3/uL   Monocytes Absolute 0.4 0.1 - 0.9 x10E3/uL   EOS (ABSOLUTE) 0.2 0.0 - 0.4 x10E3/uL   Basophils Absolute 0.1 0.0 - 0.2 x10E3/uL   Immature Granulocytes 0 Not Estab. %   Immature Grans (Abs) 0.0 0.0 - 0.1 x10E3/uL  Vitamin D  (25 hydroxy)     Status: None   Collection Time: 09/27/23  1:35 PM  Result Value Ref Range   Vit D, 25-Hydroxy 36.6 30.0 - 100.0 ng/mL    Comment: Vitamin D  deficiency has been defined by the Institute of Medicine and an Endocrine Society practice guideline as a level of serum 25-OH vitamin D  less than 20 ng/mL (1,2). The Endocrine Society went on to further define vitamin D  insufficiency as a level between 21 and 29 ng/mL (2). 1. IOM (Institute of Medicine). 2010. Dietary reference    intakes for calcium  and D. [place]: The    [employer]. 2. Holick MF, Binkley [place], Bischoff-Ferrari HA, et al.    Evaluation, treatment, and prevention of vitamin D     deficiency: an Endocrine Society clinical practice    guideline. JCEM. 2011 Jul; 96(7):1911-30.   POCT CBG (Fasting - Glucose)     Status: Abnormal   Collection Time: 10/15/23  1:47 PM  Result Value Ref Range   Glucose Fasting, POC 141 (A) 70 - 99 mg/dL  POC CREATINE & ALBUMIN,URINE     Status: Abnormal   Collection Time: 10/15/23  2:21 PM  Result Value Ref Range   Microalbumin Ur, POC 150 mg/L   Creatinine, POC 100 mg/dL   Albumin/Creatinine Ratio, Urine, POC 30-300   POCT CBG (Fasting - Glucose)     Status: Abnormal   Collection Time: 12/24/23  1:21 PM  Result Value Ref Range   Glucose Fasting, POC 204 (A) 70 - 99 mg/dL      Assessment & Plan:  Continue current medication. FL2 filled. 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  Total time spent: 30 minutes. This  time includes review of previous notes and results and patient face to face interaction during today's visit.    FERNAND FREDY RAMAN, MD  12/24/2023   This document may have been prepared by [facility] Voice Recognition software and as such may include unintentional dictation errors.

## 2023-12-31 DIAGNOSIS — E1165 Type 2 diabetes mellitus with hyperglycemia: Secondary | ICD-10-CM | POA: Diagnosis not present

## 2024-01-17 ENCOUNTER — Ambulatory Visit (INDEPENDENT_AMBULATORY_CARE_PROVIDER_SITE_OTHER): Admitting: Internal Medicine

## 2024-01-17 ENCOUNTER — Encounter: Payer: Self-pay | Admitting: Internal Medicine

## 2024-01-17 VITALS — BP 132/74 | HR 73 | Ht 61.0 in

## 2024-01-17 DIAGNOSIS — Z794 Long term (current) use of insulin: Secondary | ICD-10-CM

## 2024-01-17 DIAGNOSIS — E1159 Type 2 diabetes mellitus with other circulatory complications: Secondary | ICD-10-CM | POA: Diagnosis not present

## 2024-01-17 DIAGNOSIS — I152 Hypertension secondary to endocrine disorders: Secondary | ICD-10-CM | POA: Diagnosis not present

## 2024-01-17 DIAGNOSIS — D509 Iron deficiency anemia, unspecified: Secondary | ICD-10-CM

## 2024-01-17 DIAGNOSIS — E559 Vitamin D deficiency, unspecified: Secondary | ICD-10-CM

## 2024-01-17 DIAGNOSIS — E119 Type 2 diabetes mellitus without complications: Secondary | ICD-10-CM | POA: Diagnosis not present

## 2024-01-17 DIAGNOSIS — E1165 Type 2 diabetes mellitus with hyperglycemia: Secondary | ICD-10-CM

## 2024-01-17 DIAGNOSIS — E1169 Type 2 diabetes mellitus with other specified complication: Secondary | ICD-10-CM

## 2024-01-17 DIAGNOSIS — Z1329 Encounter for screening for other suspected endocrine disorder: Secondary | ICD-10-CM | POA: Diagnosis not present

## 2024-01-17 DIAGNOSIS — Z1231 Encounter for screening mammogram for malignant neoplasm of breast: Secondary | ICD-10-CM | POA: Insufficient documentation

## 2024-01-17 DIAGNOSIS — Z13820 Encounter for screening for osteoporosis: Secondary | ICD-10-CM | POA: Insufficient documentation

## 2024-01-17 DIAGNOSIS — E782 Mixed hyperlipidemia: Secondary | ICD-10-CM

## 2024-01-17 LAB — POCT CBG (FASTING - GLUCOSE)-MANUAL ENTRY: Glucose Fasting, POC: 357 mg/dL — AB (ref 70–99)

## 2024-01-17 MED ORDER — CARVEDILOL 25 MG PO TABS
25.0000 mg | ORAL_TABLET | Freq: Two times a day (BID) | ORAL | 3 refills | Status: AC
Start: 2024-01-17 — End: ?

## 2024-01-17 NOTE — Progress Notes (Signed)
 Established Patient Office Visit  Subjective:  Patient ID: Danielle Paul, female    DOB: 12-28-1955  Age: 68 y.o. MRN: [id number]  Chief Complaint  Patient presents with   Follow-up    3 month follow up    Patient is here for follow up. She reports feeling poorly today.  She is due for routine blood work so will collect labs today and discuss when patient returns for AWV. Random blood sugar today is 357. She reports drinking juice before her office visit for a blood sugar of 96. She has not eaten today. Patient reports her fasting blood sugars range 120s and increase to 300's after meals. Patient reports taking 35 units of long acting insulin  daily and 10 units of short acting insulin  with meals. Patient reports that her Ozempic  was stopped being filled by her pharmacy since her 09/2023 appointment.   She reports having diarrhea for the last 7 days. She reports going 3-4 times a day. She denies stool softener or laxatives. Denies any abdominal pain or tenderness. Will start Imodium as needed for Diarrhea. Reinforced healthy diet and staying hydrated by drinking plenty of water. Recommend patient pick up sugar free Gatorade.   Eye exam has not been completed for this year. Patient will get that completed. She reports its been over a year since her lat eye exam.  Patient reports insomnia and increased stress. Patient is due for mammogram and DEXA scan. Last mammogram was in 2023. Dexa scan has not been completed. Will order.  Patient would like a flu shot today but will defer at this time due to feeling unwell.    No other concerns at this time.   Past Medical History:  Diagnosis Date   Anxiety    Coronary artery disease    CVA (cerebral vascular accident) (HCC) 01/2020   Diabetes mellitus without complication (HCC)    Hypercholesteremia    Hypertension    Left-sided weakness    S/P stroke    Past Surgical History:  Procedure Laterality Date   AORTIC VALVE REPLACEMENT  (AVR)/CORONARY ARTERY BYPASS GRAFTING (CABG)  2009   CAROTID ANGIOGRAPHY N/A 03/21/2020   Procedure: CAROTID ANGIOGRAPHY;  Surgeon: Marea Selinda RAMAN, MD;  Location: [facility];  Service: Cardiovascular;  Laterality: N/A;    Social History   Socioeconomic History   Marital status: Married    Spouse name: Not on file   Number of children: Not on file   Years of education: Not on file   Highest education level: Not on file  Occupational History   Not on file  Tobacco Use   Smoking status: Never   Smokeless tobacco: Never  Vaping Use   Vaping status: Never Used  Substance and Sexual Activity   Alcohol use: Never   Drug use: Never   Sexual activity: Not on file  Other Topics Concern   Not on file  Social History Narrative   Not on file   Social Drivers of Health   Financial Resource Strain: Medium Risk (01/16/2019)   Received from [facility]   Overall Financial Resource Strain (CARDIA)    Difficulty of Paying Living Expenses: Somewhat hard  Food Insecurity: No Food Insecurity (10/17/2021)   Hunger Vital Sign    Worried About Running Out of Food in the Last Year: Never true    Ran Out of Food in the Last Year: Never true  Transportation Needs: No Transportation Needs (10/17/2021)   PRAPARE - Transportation  Lack of Transportation (Medical): No    Lack of Transportation (Non-Medical): No  Recent Concern: Transportation Needs - Unmet Transportation Needs (10/17/2021)   PRAPARE - Transportation    Lack of Transportation (Medical): Yes    Lack of Transportation (Non-Medical): Yes  Physical Activity: Sufficiently Active (01/16/2019)   Received from [facility]   Exercise Vital Sign    Days of Exercise per Week: 5 days    Minutes of Exercise per Session: 30 min  Stress: No Stress Concern Present (01/16/2019)   Received from [facility] - Occupational Stress Questionnaire     Feeling of Stress : Only a little  Social Connections: Moderately Integrated (01/16/2019)   Received from [facility]   Social Connection and Isolation Panel    Frequency of Communication with Friends and Family: More than three times a week    Frequency of Social Gatherings with Friends and Family: Once a week    Attends Religious Services: More than 4 times per year    Active Member of [employer] or Organizations: No    Attends Banker Meetings: Never    Marital Status: Married  Catering Manager Violence: Not on file    Family History  Problem Relation Age of Onset   Other Mother    Hypertension Mother    Hypertension Father     Allergies  Allergen Reactions   Atorvastatin Other (See Comments)    Muscle Pains   Isosorbide Nitrate Other (See Comments)    Jittery and lightheadedness.   Pravastatin Other (See Comments)   Simvastatin Other (See Comments)    Muscle Pains    Outpatient Medications Prior to Visit  Medication Sig   amLODipine  (NORVASC ) 10 MG tablet Take 1 tablet (10 mg total) by mouth daily.   ASPIRIN  LOW DOSE 81 MG tablet TAKE ONE TABLET BY MOUTH DAILY   blood glucose meter kit and supplies KIT Dispense based on patient and insurance preference. Use up to four times daily as directed.   Blood Glucose Monitoring Suppl (ONETOUCH VERIO FLEX SYSTEM) DEVI 1 each by Does not apply route as directed. Use to check blood sugar up to 4 times daily. Dx: E11.65 Please substitute to any brand preferred by insurance   Blood Glucose Monitoring Suppl (TRUE METRIX AIR GLUCOSE METER) w/Device KIT 1 each by Does not apply route 3 (three) times daily.   Cholecalciferol  1.25 MG (50000 UT) capsule Take 1 capsule (50,000 Units total) by mouth once a week.   clopidogrel  (PLAVIX ) 75 MG tablet TAKE ONE TABLET BY MOUTH DAILY   Continuous Glucose Receiver (FREESTYLE LIBRE 3 READER) DEVI 1 each by Does not apply route daily.   ezetimibe  (ZETIA ) 10 MG tablet Take 1  tablet (10 mg total) by mouth daily.   FARXIGA  10 MG TABS tablet Take 1 tablet (10 mg total) by mouth daily.   FLUoxetine  (PROZAC ) 20 MG capsule Take 20 mg by mouth daily.   gabapentin  (NEURONTIN ) 100 MG capsule Take 1 capsule (100 mg total) by mouth at bedtime.   glucose blood (ONETOUCH VERIO) test strip Use to check blood sugar up to four times daily as directed. Dx: E11.65 Please substitute to any brand preferred by insurance.   GNP Sterile Lancets 33G MISC USE AS DIRECTED TO CHECK GLUCOSE   HUMALOG  KWIKPEN 100 UNIT/ML KwikPen Inject 7 Units into the skin 3 (three) times daily.   insulin  glargine (LANTUS  SOLOSTAR) 100 UNIT/ML Solostar  Pen Inject 45 Units into the skin at bedtime.   Insulin  Pen Needle 32G X 6 MM MISC 1 each by Does not apply route once a week.   Lancets (ONETOUCH DELICA PLUS LANCET30G) MISC Use to check blood sugar up to four times daily as directed. Dx: E11.65 Please substitute to any brand preferred by insurance.   nitroGLYCERIN (NITROSTAT) 0.3 MG SL tablet DISSOLVE 1 TABLET UNDER TONGUE AS NEEDED FOR CHEST PAIN. MAY REPEAT IN 5 MINUTES AS DIRECTED   NOVOLOG  FLEXPEN 100 UNIT/ML FlexPen USE AS DIRECTED INJECT THREE TIMES DAILY UNDER THE SKIN (SUBCUTANEOUSLY) PER SLIDING SCALE MAX OF 30 UNITS DAILY   rosuvastatin  (CRESTOR ) 40 MG tablet Take 1 tablet (40 mg total) by mouth daily.   Semaglutide , 1 MG/DOSE, 4 MG/3ML SOPN Inject 1 mg into the skin once a week.   spironolactone  (ALDACTONE ) 25 MG tablet Take 1 tablet (25 mg total) by mouth daily.   [DISCONTINUED] carvedilol  (COREG ) 25 MG tablet Take 1 tablet (25 mg total) by mouth 2 (two) times daily.   No facility-administered medications prior to visit.    Review of Systems  Constitutional:  Positive for malaise/fatigue. Negative for chills and fever.  HENT: Negative.  Negative for congestion and sore throat.   Eyes: Negative.  Negative for blurred vision and pain.  Respiratory: Negative.  Negative for cough and shortness of  breath.   Cardiovascular: Negative.  Negative for chest pain, palpitations and leg swelling.  Gastrointestinal:  Positive for diarrhea. Negative for abdominal pain, blood in stool, constipation, heartburn, melena, nausea and vomiting.  Genitourinary: Negative.  Negative for dysuria, flank pain, frequency and urgency.  Musculoskeletal: Negative.  Negative for joint pain and myalgias.  Skin: Negative.   Neurological: Negative.  Negative for dizziness, tingling, sensory change, weakness and headaches.  Endo/Heme/Allergies: Negative.   Psychiatric/Behavioral:  Negative for depression and suicidal ideas. The patient has insomnia. The patient is not nervous/anxious.        Objective:   BP 132/74   Pulse 73   Ht 5' 1 (1.549 m)   SpO2 98%   BMI 31.16 kg/m   Vitals:   01/17/24 1306  BP: 132/74  Pulse: 73  Height: 5' 1 (1.549 m)  Weight: Comment: unable to weigh  SpO2: 98%    Physical Exam Vitals and nursing note reviewed.  Constitutional:      Appearance: Normal appearance.  HENT:     Head: Normocephalic and atraumatic.     Nose: Nose normal.     Mouth/Throat:     Mouth: Mucous membranes are moist.     Pharynx: Oropharynx is clear.  Eyes:     Conjunctiva/sclera: Conjunctivae normal.     Pupils: Pupils are equal, round, and reactive to light.  Cardiovascular:     Rate and Rhythm: Normal rate and regular rhythm.     Pulses: Normal pulses.     Heart sounds: Normal heart sounds. No murmur heard. Pulmonary:     Effort: Pulmonary effort is normal.     Breath sounds: Normal breath sounds. No wheezing.  Abdominal:     General: Bowel sounds are normal.     Palpations: Abdomen is soft.     Tenderness: There is no abdominal tenderness. There is no right CVA tenderness or left CVA tenderness.  Musculoskeletal:        General: Normal range of motion.     Cervical back: Normal range of motion.     Right lower leg: No edema.  Left lower leg: No edema.  Skin:    General: Skin  is warm and dry.  Neurological:     General: No focal deficit present.     Mental Status: She is alert and oriented to person, place, and time.  Psychiatric:        Mood and Affect: Mood normal.        Behavior: Behavior normal.      Results for orders placed or performed in visit on 01/17/24  POCT CBG (Fasting - Glucose)  Result Value Ref Range   Glucose Fasting, POC 357 (A) 70 - 99 mg/dL    Recent Results (from the past 2160 hours)  POCT CBG (Fasting - Glucose)     Status: Abnormal   Collection Time: 12/24/23  1:21 PM  Result Value Ref Range   Glucose Fasting, POC 204 (A) 70 - 99 mg/dL  POCT CBG (Fasting - Glucose)     Status: Abnormal   Collection Time: 01/17/24  1:11 PM  Result Value Ref Range   Glucose Fasting, POC 357 (A) 70 - 99 mg/dL      Assessment & Plan:  Refill sent. Check routine blood work today and FU with patient on results. Continue taking medications as prescribed. Will restart Ozempic  based off HbgA1c result. Patient to take imodium for loose stools. Dexa scan ordered. Mammogram ordered. Problem List Items Addressed This Visit     Essential hypertension, benign - Primary   Relevant Medications   carvedilol  (COREG ) 25 MG tablet   Hyperlipidemia   Relevant Medications   carvedilol  (COREG ) 25 MG tablet   Iron deficiency anemia   Relevant Orders   CBC with Differential/Platelet   Vitamin D  deficiency   Relevant Orders   Vitamin D  (25 hydroxy)   Type 2 diabetes mellitus with hyperglycemia, with long-term current use of insulin  (HCC)   Relevant Orders   POCT CBG (Fasting - Glucose) (Completed)   Hemoglobin A1c   Breast cancer screening by mammogram   Relevant Orders   MM 3D SCREENING MAMMOGRAM BILATERAL BREAST   Screening for osteoporosis   Relevant Orders   HM DEXA SCAN (Completed)   Thyroid  disorder screening   Relevant Orders   TSH    Return in about 1 week (around 01/24/2024) for AWV.   Total time spent: 25 minutes. This time includes  review of previous notes and results and patient face to face interaction during today's visit.    FERNAND FREDY RAMAN, MD  01/17/2024   This document may have been prepared by [facility] Voice Recognition software and as such may include unintentional dictation errors.

## 2024-01-18 ENCOUNTER — Ambulatory Visit: Payer: Self-pay | Admitting: Internal Medicine

## 2024-01-18 LAB — CBC WITH DIFFERENTIAL/PLATELET
Basophils Absolute: 0 x10E3/uL (ref 0.0–0.2)
Basos: 1 %
EOS (ABSOLUTE): 0.1 x10E3/uL (ref 0.0–0.4)
Eos: 2 %
Hematocrit: 42.6 % (ref 34.0–46.6)
Hemoglobin: 13 g/dL (ref 11.1–15.9)
Immature Grans (Abs): 0 x10E3/uL (ref 0.0–0.1)
Immature Granulocytes: 0 %
Lymphocytes Absolute: 1.4 x10E3/uL (ref 0.7–3.1)
Lymphs: 25 %
MCH: 26.6 pg (ref 26.6–33.0)
MCHC: 30.5 g/dL — ABNORMAL LOW (ref 31.5–35.7)
MCV: 87 fL (ref 79–97)
Monocytes Absolute: 0.4 x10E3/uL (ref 0.1–0.9)
Monocytes: 8 %
Neutrophils Absolute: 3.7 x10E3/uL (ref 1.4–7.0)
Neutrophils: 64 %
Platelets: 260 x10E3/uL (ref 150–450)
RBC: 4.88 x10E6/uL (ref 3.77–5.28)
RDW: 12.5 % (ref 11.7–15.4)
WBC: 5.7 x10E3/uL (ref 3.4–10.8)

## 2024-01-18 LAB — CMP14+EGFR
ALT: 9 IU/L (ref 0–32)
AST: 13 IU/L (ref 0–40)
Albumin: 3.9 g/dL (ref 3.9–4.9)
Alkaline Phosphatase: 94 IU/L (ref 49–135)
BUN/Creatinine Ratio: 18 (ref 12–28)
BUN: 21 mg/dL (ref 8–27)
Bilirubin Total: 0.3 mg/dL (ref 0.0–1.2)
CO2: 23 mmol/L (ref 20–29)
Calcium: 9.4 mg/dL (ref 8.7–10.3)
Chloride: 99 mmol/L (ref 96–106)
Creatinine, Ser: 1.15 mg/dL — ABNORMAL HIGH (ref 0.57–1.00)
Globulin, Total: 2.6 g/dL (ref 1.5–4.5)
Glucose: 317 mg/dL — ABNORMAL HIGH (ref 70–99)
Potassium: 4 mmol/L (ref 3.5–5.2)
Sodium: 139 mmol/L (ref 134–144)
Total Protein: 6.5 g/dL (ref 6.0–8.5)
eGFR: 52 mL/min/1.73 — ABNORMAL LOW (ref >59)

## 2024-01-18 LAB — LIPID PANEL
Chol/HDL Ratio: 5.1 ratio — ABNORMAL HIGH (ref 0.0–4.4)
Cholesterol, Total: 213 mg/dL — ABNORMAL HIGH (ref 100–199)
HDL: 42 mg/dL (ref >39)
LDL Chol Calc (NIH): 151 mg/dL — ABNORMAL HIGH (ref 0–99)
Triglycerides: 108 mg/dL (ref 0–149)
VLDL Cholesterol Cal: 20 mg/dL (ref 5–40)

## 2024-01-18 LAB — HEMOGLOBIN A1C
Est. average glucose Bld gHb Est-mCnc: 229 mg/dL
Hgb A1c MFr Bld: 9.6 % — ABNORMAL HIGH (ref 4.8–5.6)

## 2024-01-18 LAB — VITAMIN D 25 HYDROXY (VIT D DEFICIENCY, FRACTURES): Vit D, 25-Hydroxy: 30.1 ng/mL (ref 30.0–100.0)

## 2024-01-18 LAB — TSH: TSH: 2.03 u[IU]/mL (ref 0.450–4.500)

## 2024-01-18 NOTE — Progress Notes (Signed)
 Patient notified

## 2024-01-31 ENCOUNTER — Ambulatory Visit: Admitting: Internal Medicine

## 2024-01-31 ENCOUNTER — Encounter: Payer: Self-pay | Admitting: Internal Medicine

## 2024-01-31 VITALS — BP 112/84 | HR 66 | Ht 61.0 in

## 2024-01-31 DIAGNOSIS — I83029 Varicose veins of left lower extremity with ulcer of unspecified site: Secondary | ICD-10-CM | POA: Diagnosis not present

## 2024-01-31 DIAGNOSIS — E1169 Type 2 diabetes mellitus with other specified complication: Secondary | ICD-10-CM

## 2024-01-31 DIAGNOSIS — I152 Hypertension secondary to endocrine disorders: Secondary | ICD-10-CM | POA: Diagnosis not present

## 2024-01-31 DIAGNOSIS — Z0000 Encounter for general adult medical examination without abnormal findings: Secondary | ICD-10-CM | POA: Insufficient documentation

## 2024-01-31 DIAGNOSIS — F411 Generalized anxiety disorder: Secondary | ICD-10-CM | POA: Insufficient documentation

## 2024-01-31 DIAGNOSIS — R600 Localized edema: Secondary | ICD-10-CM | POA: Insufficient documentation

## 2024-01-31 DIAGNOSIS — E1159 Type 2 diabetes mellitus with other circulatory complications: Secondary | ICD-10-CM | POA: Diagnosis not present

## 2024-01-31 DIAGNOSIS — Z13820 Encounter for screening for osteoporosis: Secondary | ICD-10-CM

## 2024-01-31 DIAGNOSIS — E1165 Type 2 diabetes mellitus with hyperglycemia: Secondary | ICD-10-CM | POA: Diagnosis not present

## 2024-01-31 DIAGNOSIS — Z794 Long term (current) use of insulin: Secondary | ICD-10-CM | POA: Diagnosis not present

## 2024-01-31 DIAGNOSIS — F321 Major depressive disorder, single episode, moderate: Secondary | ICD-10-CM

## 2024-01-31 DIAGNOSIS — Z0001 Encounter for general adult medical examination with abnormal findings: Secondary | ICD-10-CM | POA: Diagnosis not present

## 2024-01-31 DIAGNOSIS — Z1211 Encounter for screening for malignant neoplasm of colon: Secondary | ICD-10-CM | POA: Insufficient documentation

## 2024-01-31 DIAGNOSIS — G8194 Hemiplegia, unspecified affecting left nondominant side: Secondary | ICD-10-CM

## 2024-01-31 DIAGNOSIS — E782 Mixed hyperlipidemia: Secondary | ICD-10-CM | POA: Diagnosis not present

## 2024-01-31 LAB — POCT CBG (FASTING - GLUCOSE)-MANUAL ENTRY: Glucose Fasting, POC: 176 mg/dL — AB (ref 70–99)

## 2024-01-31 MED ORDER — OZEMPIC (0.25 OR 0.5 MG/DOSE) 2 MG/1.5ML ~~LOC~~ SOPN: 0.5000 mg | PEN_INJECTOR | SUBCUTANEOUS | 1 refills | Status: AC

## 2024-01-31 MED ORDER — FLUOXETINE HCL 40 MG PO CAPS: 40.0000 mg | ORAL_CAPSULE | Freq: Every day | ORAL | 1 refills | Status: AC

## 2024-01-31 MED ORDER — REPATHA SURECLICK 140 MG/ML ~~LOC~~ SOAJ: 140.0000 mg | SUBCUTANEOUS | 2 refills | Status: AC

## 2024-01-31 NOTE — Progress Notes (Signed)
 Established Patient Office Visit  Subjective:  Patient ID: Danielle Paul, female    DOB: 05/13/1955  Age: 68 y.o. MRN: [id number]  Chief Complaint  Patient presents with   Annual Exam    AWV    Patient is here today for Medicare AWV. She recently had blood work completed. Patient HbgA1c and LDL has not improved. Lantus  insulin  was increased to 40 units daily and advised to continue sliding scale insulin . Last HbgA1c was 9.6% Will restart Ozempic . Will provide patient with sample box of Ozempic  to start back at 0.25 mg weekly injection. Patient reports she has been using her freestyle libre to continuously monitor her blood sugar readings to help in getting her sugars within her goal range of 100-120.  She reports taking her medications as prescribed. Discussed Repatha  at her previous visit if cholesterol levels did not improve. Will send order to begin Repatha .  Patient is past due for Mammogram, DEXA scan, and colon cancer screening. Mammogram has previously been ordered. Patient to get appointment scheduled. Will order DEXA and send GI referral.   Patient states her left leg is sore as it constant bumps into her foot rest on her wheelchair. Upon exam there is broken down skin and bilateral lower extremity edema 2+ in addition to lymphedema. Will cover wound with 4x4 dressing and change daily and as needed. Send home health referral for wound care and PT consult.  Patient reports husband got put into long term care facility and at this time she is still living at home now alone. She reports she is moving next month to be with family. PHQ-9 score 6; GAD-7 score 8. Patient would like to increase Prozac  to 40 mg daily as she reports feeling more irritable and having difficulty sleeping at night.    No other concerns at this time.   Past Medical History:  Diagnosis Date   Anxiety    Coronary artery disease    CVA (cerebral vascular accident) (HCC) 01/2020   Diabetes mellitus without  complication (HCC)    Hypercholesteremia    Hypertension    Left-sided weakness    S/P stroke    Past Surgical History:  Procedure Laterality Date   AORTIC VALVE REPLACEMENT (AVR)/CORONARY ARTERY BYPASS GRAFTING (CABG)  2009   CAROTID ANGIOGRAPHY N/A 03/21/2020   Procedure: CAROTID ANGIOGRAPHY;  Surgeon: Marea Selinda RAMAN, MD;  Location: [facility] INVASIVE CV LAB;  Service: Cardiovascular;  Laterality: N/A;    Social History   Socioeconomic History   Marital status: Married    Spouse name: Not on file   Number of children: Not on file   Years of education: Not on file   Highest education level: Not on file  Occupational History   Not on file  Tobacco Use   Smoking status: Never   Smokeless tobacco: Never  Vaping Use   Vaping status: Never Used  Substance and Sexual Activity   Alcohol use: Never   Drug use: Never   Sexual activity: Not on file  Other Topics Concern   Not on file  Social History Narrative   Not on file   Social Drivers of Health   Financial Resource Strain: Medium Risk (01/16/2019)   Received from [facility]   Overall Financial Resource Strain (CARDIA)    Difficulty of Paying Living Expenses: Somewhat hard  Food Insecurity: No Food Insecurity (10/17/2021)   Hunger Vital Sign    Worried About Running Out of Food in the Last Year: Never true  Ran Out of Food in the Last Year: Never true  Transportation Needs: No Transportation Needs (10/17/2021)   PRAPARE - Administrator, Civil Service (Medical): No    Lack of Transportation (Non-Medical): No  Recent Concern: Transportation Needs - Unmet Transportation Needs (10/17/2021)   PRAPARE - Transportation    Lack of Transportation (Medical): Yes    Lack of Transportation (Non-Medical): Yes  Physical Activity: Sufficiently Active (01/16/2019)   Received from [facility]   Exercise Vital Sign    Days of Exercise per Week: 5 days    Minutes of Exercise per Session: 30  min  Stress: No Stress Concern Present (01/16/2019)   Received from [facility] - Occupational Stress Questionnaire    Feeling of Stress : Only a little  Social Connections: Moderately Integrated (01/16/2019)   Received from [facility]   Social Connection and Isolation Panel    Frequency of Communication with Friends and Family: More than three times a week    Frequency of Social Gatherings with Friends and Family: Once a week    Attends Religious Services: More than 4 times per year    Active Member of [employer] or Organizations: No    Attends Banker Meetings: Never    Marital Status: Married  Catering Manager Violence: Not on file    Family History  Problem Relation Age of Onset   Other Mother    Hypertension Mother    Hypertension Father     Allergies  Allergen Reactions   Atorvastatin Other (See Comments)    Muscle Pains   Isosorbide Nitrate Other (See Comments)    Jittery and lightheadedness.   Pravastatin Other (See Comments)   Simvastatin Other (See Comments)    Muscle Pains    Outpatient Medications Prior to Visit  Medication Sig   amLODipine  (NORVASC ) 10 MG tablet Take 1 tablet (10 mg total) by mouth daily.   ASPIRIN  LOW DOSE 81 MG tablet TAKE ONE TABLET BY MOUTH DAILY   blood glucose meter kit and supplies KIT Dispense based on patient and insurance preference. Use up to four times daily as directed.   Blood Glucose Monitoring Suppl (ONETOUCH VERIO FLEX SYSTEM) DEVI 1 each by Does not apply route as directed. Use to check blood sugar up to 4 times daily. Dx: E11.65 Please substitute to any brand preferred by insurance   Blood Glucose Monitoring Suppl (TRUE METRIX AIR GLUCOSE METER) w/Device KIT 1 each by Does not apply route 3 (three) times daily.   carvedilol  (COREG ) 25 MG tablet Take 1 tablet (25 mg total) by mouth 2 (two) times daily.   Cholecalciferol  1.25 MG (50000 UT)  capsule Take 1 capsule (50,000 Units total) by mouth once a week.   clopidogrel  (PLAVIX ) 75 MG tablet TAKE ONE TABLET BY MOUTH DAILY   Continuous Glucose Receiver (FREESTYLE LIBRE 3 READER) DEVI 1 each by Does not apply route daily.   ezetimibe  (ZETIA ) 10 MG tablet Take 1 tablet (10 mg total) by mouth daily.   FARXIGA  10 MG TABS tablet Take 1 tablet (10 mg total) by mouth daily.   gabapentin  (NEURONTIN ) 100 MG capsule Take 1 capsule (100 mg total) by mouth at bedtime.   glucose blood (ONETOUCH VERIO) test strip Use to check blood sugar up to four times daily as directed. Dx: E11.65 Please substitute to any brand preferred by insurance.   GNP Sterile Lancets 33G MISC USE  AS DIRECTED TO CHECK GLUCOSE   HUMALOG  KWIKPEN 100 UNIT/ML KwikPen Inject 7 Units into the skin 3 (three) times daily.   insulin  glargine (LANTUS  SOLOSTAR) 100 UNIT/ML Solostar Pen Inject 45 Units into the skin at bedtime.   Insulin  Pen Needle 32G X 6 MM MISC 1 each by Does not apply route once a week.   Lancets (ONETOUCH DELICA PLUS LANCET30G) MISC Use to check blood sugar up to four times daily as directed. Dx: E11.65 Please substitute to any brand preferred by insurance.   nitroGLYCERIN (NITROSTAT) 0.3 MG SL tablet DISSOLVE 1 TABLET UNDER TONGUE AS NEEDED FOR CHEST PAIN. MAY REPEAT IN 5 MINUTES AS DIRECTED   NOVOLOG  FLEXPEN 100 UNIT/ML FlexPen USE AS DIRECTED INJECT THREE TIMES DAILY UNDER THE SKIN (SUBCUTANEOUSLY) PER SLIDING SCALE MAX OF 30 UNITS DAILY   rosuvastatin  (CRESTOR ) 40 MG tablet Take 1 tablet (40 mg total) by mouth daily.   spironolactone  (ALDACTONE ) 25 MG tablet Take 1 tablet (25 mg total) by mouth daily.   [DISCONTINUED] FLUoxetine  (PROZAC ) 20 MG capsule Take 20 mg by mouth daily.   [DISCONTINUED] Semaglutide , 1 MG/DOSE, 4 MG/3ML SOPN Inject 1 mg into the skin once a week.   No facility-administered medications prior to visit.    Review of Systems  Constitutional: Negative.  Negative for chills, fever and  malaise/fatigue.  HENT: Negative.  Negative for congestion and sore throat.   Eyes: Negative.  Negative for blurred vision and pain.  Respiratory: Negative.  Negative for cough and shortness of breath.   Cardiovascular: Negative.  Negative for chest pain, palpitations and leg swelling.  Gastrointestinal: Negative.  Negative for abdominal pain, blood in stool, constipation, diarrhea, heartburn, melena, nausea and vomiting.  Genitourinary: Negative.  Negative for dysuria, flank pain, frequency and urgency.  Musculoskeletal: Negative.  Negative for joint pain and myalgias.  Skin: Negative.   Neurological: Negative.  Negative for dizziness, tingling, sensory change, weakness and headaches.  Endo/Heme/Allergies: Negative.   Psychiatric/Behavioral: Negative.  Negative for depression and suicidal ideas. The patient is not nervous/anxious.        Objective:   BP 112/84   Pulse 66   Ht 5' 1 (1.549 m)   SpO2 99%   BMI 31.16 kg/m   Vitals:   01/31/24 1412  BP: 112/84  Pulse: 66  Height: 5' 1 (1.549 m)  Weight: Comment: unable to weigh  SpO2: 99%    Physical Exam Vitals and nursing note reviewed.  Constitutional:      Appearance: Normal appearance.  HENT:     Head: Normocephalic and atraumatic.     Nose: Nose normal.     Mouth/Throat:     Mouth: Mucous membranes are moist.     Pharynx: Oropharynx is clear.  Eyes:     Conjunctiva/sclera: Conjunctivae normal.     Pupils: Pupils are equal, round, and reactive to light.  Cardiovascular:     Rate and Rhythm: Normal rate and regular rhythm.     Pulses: Normal pulses.     Heart sounds: Normal heart sounds. No murmur heard. Pulmonary:     Effort: Pulmonary effort is normal.     Breath sounds: Normal breath sounds. No wheezing.  Abdominal:     General: Bowel sounds are normal.     Palpations: Abdomen is soft.     Tenderness: There is no abdominal tenderness. There is no right CVA tenderness or left CVA tenderness.   Musculoskeletal:        General: Normal range of motion.  Cervical back: Normal range of motion.     Right lower leg: No edema.     Left lower leg: No edema.  Skin:    General: Skin is warm and dry.  Neurological:     General: No focal deficit present.     Mental Status: She is alert and oriented to person, place, and time.  Psychiatric:        Mood and Affect: Mood normal.        Behavior: Behavior normal.      Results for orders placed or performed in visit on 01/31/24  POCT CBG (Fasting - Glucose)  Result Value Ref Range   Glucose Fasting, POC 176 (A) 70 - 99 mg/dL    Recent Results (from the past 2160 hours)  POCT CBG (Fasting - Glucose)     Status: Abnormal   Collection Time: 12/24/23  1:21 PM  Result Value Ref Range   Glucose Fasting, POC 204 (A) 70 - 99 mg/dL  POCT CBG (Fasting - Glucose)     Status: Abnormal   Collection Time: 01/17/24  1:11 PM  Result Value Ref Range   Glucose Fasting, POC 357 (A) 70 - 99 mg/dL  [id number]     Status: Abnormal   Collection Time: 01/17/24  2:08 PM  Result Value Ref Range   Glucose 317 (H) 70 - 99 mg/dL   BUN 21 8 - 27 mg/dL   Creatinine, Ser 8.84 (H) 0.57 - 1.00 mg/dL   eGFR 52 (L) >40 fO/fpw/8.26   BUN/Creatinine Ratio 18 12 - 28   Sodium 139 134 - 144 mmol/L   Potassium 4.0 3.5 - 5.2 mmol/L   Chloride 99 96 - 106 mmol/L   CO2 23 20 - 29 mmol/L   Calcium  9.4 8.7 - 10.3 mg/dL   Total Protein 6.5 6.0 - 8.5 g/dL   Albumin 3.9 3.9 - 4.9 g/dL   Globulin, Total 2.6 1.5 - 4.5 g/dL   Bilirubin Total 0.3 0.0 - 1.2 mg/dL   Alkaline Phosphatase 94 49 - 135 IU/L   AST 13 0 - 40 IU/L   ALT 9 0 - 32 IU/L  Lipid Profile     Status: Abnormal   Collection Time: 01/17/24  2:08 PM  Result Value Ref Range   Cholesterol, Total 213 (H) 100 - 199 mg/dL   Triglycerides 891 0 - 149 mg/dL   HDL 42 >60 mg/dL   VLDL Cholesterol Cal 20 5 - 40 mg/dL   LDL Chol Calc (NIH) 848 (H) 0 - 99 mg/dL   Chol/HDL Ratio 5.1 (H) 0.0 - 4.4 ratio     Comment:                                   T. Chol/HDL Ratio                                             Men  Women                               1/2 Avg.Risk  3.4    3.3  Avg.Risk  5.0    4.4                                2X Avg.Risk  9.6    7.1                                3X Avg.Risk 23.4   11.0   Vitamin D  (25 hydroxy)     Status: None   Collection Time: 01/17/24  2:08 PM  Result Value Ref Range   Vit D, 25-Hydroxy 30.1 30.0 - 100.0 ng/mL    Comment: Vitamin D  deficiency has been defined by the Institute of Medicine and an Endocrine Society practice guideline as a level of serum 25-OH vitamin D  less than 20 ng/mL (1,2). The Endocrine Society went on to further define vitamin D  insufficiency as a level between 21 and 29 ng/mL (2). 1. IOM (Institute of Medicine). 2010. Dietary reference    intakes for calcium  and D. [place]: The    [employer]. 2. Holick MF, Binkley Sheridan, Bischoff-Ferrari HA, et al.    Evaluation, treatment, and prevention of vitamin D     deficiency: an Endocrine Society clinical practice    guideline. JCEM. 2011 Jul; 96(7):1911-30.   CBC with Differential/Platelet     Status: Abnormal   Collection Time: 01/17/24  2:08 PM  Result Value Ref Range   WBC 5.7 3.4 - 10.8 x10E3/uL   RBC 4.88 3.77 - 5.28 x10E6/uL   Hemoglobin 13.0 11.1 - 15.9 g/dL   Hematocrit 57.3 65.9 - 46.6 %   MCV 87 79 - 97 fL   MCH 26.6 26.6 - 33.0 pg   MCHC 30.5 (L) 31.5 - 35.7 g/dL   RDW 87.4 88.2 - 84.5 %   Platelets 260 150 - 450 x10E3/uL   Neutrophils 64 Not Estab. %   Lymphs 25 Not Estab. %   Monocytes 8 Not Estab. %   Eos 2 Not Estab. %   Basos 1 Not Estab. %   Neutrophils Absolute 3.7 1.4 - 7.0 x10E3/uL   Lymphocytes Absolute 1.4 0.7 - 3.1 x10E3/uL   Monocytes Absolute 0.4 0.1 - 0.9 x10E3/uL   EOS (ABSOLUTE) 0.1 0.0 - 0.4 x10E3/uL   Basophils Absolute 0.0 0.0 - 0.2 x10E3/uL   Immature Granulocytes 0 Not Estab. %   Immature  Grans (Abs) 0.0 0.0 - 0.1 x10E3/uL  Hemoglobin A1c     Status: Abnormal   Collection Time: 01/17/24  2:08 PM  Result Value Ref Range   Hgb A1c MFr Bld 9.6 (H) 4.8 - 5.6 %    Comment:          Prediabetes: 5.7 - 6.4          Diabetes: >6.4          Glycemic control for adults with diabetes: <7.0    Est. average glucose Bld gHb Est-mCnc 229 mg/dL  TSH     Status: None   Collection Time: 01/17/24  2:08 PM  Result Value Ref Range   TSH 2.030 0.450 - 4.500 uIU/mL  POCT CBG (Fasting - Glucose)     Status: Abnormal   Collection Time: 01/31/24  2:17 PM  Result Value Ref Range   Glucose Fasting, POC 176 (A) 70 - 99 mg/dL      Assessment & Plan:  Start Ozempic  0.25 mg weekly injection. Switch cholesterol  medication to Repatha . Continue other medications as prescribed. Increase Prozac  to 40 mg daily. Home healthy referral sent. GI referral sent. Cover wound with sterile 4x4 dressing. Problem List Items Addressed This Visit     Depression   Relevant Medications   FLUoxetine  (PROZAC ) 40 MG capsule   Type 2 diabetes mellitus with hyperglycemia, with long-term current use of insulin  (HCC)   Relevant Medications   Semaglutide ,0.25 or 0.5MG /DOS, (OZEMPIC , 0.25 OR 0.5 MG/DOSE,) 2 MG/1.5ML SOPN (Start on 02/28/2024)   Other Relevant Orders   POCT CBG (Fasting - Glucose) (Completed)   Left hemiparesis (HCC)   Relevant Orders   Ambulatory referral to Home Health   Combined hyperlipidemia associated with type 2 diabetes mellitus (HCC)   Relevant Medications   Evolocumab  (REPATHA  SURECLICK) 140 MG/ML SOAJ   Semaglutide ,0.25 or 0.5MG /DOS, (OZEMPIC , 0.25 OR 0.5 MG/DOSE,) 2 MG/1.5ML SOPN (Start on 02/28/2024)   Hypertension associated with diabetes (HCC)   Relevant Medications   Evolocumab  (REPATHA  SURECLICK) 140 MG/ML SOAJ   Semaglutide ,0.25 or 0.5MG /DOS, (OZEMPIC , 0.25 OR 0.5 MG/DOSE,) 2 MG/1.5ML SOPN (Start on 02/28/2024)   Screening for osteoporosis   Relevant Orders   DG Bone Density   Colon  cancer screening   Relevant Orders   Ambulatory referral to Gastroenterology   Venous stasis ulcer of left lower leg with edema of left lower leg (HCC)   Relevant Orders   Ambulatory referral to Home Health   Compression stockings   GAD (generalized anxiety disorder)   Relevant Medications   FLUoxetine  (PROZAC ) 40 MG capsule   Medicare annual wellness visit, subsequent - Primary    Return in about 10 days (around 02/10/2024).   Total time spent: 30 minutes. This time includes review of previous notes and results and patient face to face interaction during today's visit.    FERNAND FREDY RAMAN, MD  01/31/2024   This document may have been prepared by [facility] Voice Recognition software and as such may include unintentional dictation errors.

## 2024-02-11 ENCOUNTER — Ambulatory Visit: Admitting: Internal Medicine

## 2024-02-21 ENCOUNTER — Other Ambulatory Visit: Payer: Self-pay | Admitting: Internal Medicine

## 2024-02-21 MED ORDER — ONETOUCH VERIO VI STRP: ORAL_STRIP | 3 refills | Status: AC

## 2024-03-02 ENCOUNTER — Other Ambulatory Visit: Payer: Self-pay

## 2024-03-02 DIAGNOSIS — I209 Angina pectoris, unspecified: Secondary | ICD-10-CM

## 2024-03-02 DIAGNOSIS — Z794 Long term (current) use of insulin: Secondary | ICD-10-CM

## 2024-03-02 MED ORDER — LANTUS SOLOSTAR 100 UNIT/ML ~~LOC~~ SOPN: 45.0000 [IU] | PEN_INJECTOR | Freq: Every day | SUBCUTANEOUS | 2 refills | Status: AC

## 2024-03-02 MED ORDER — NITROGLYCERIN 0.3 MG SL SUBL: 0.3000 mg | SUBLINGUAL_TABLET | SUBLINGUAL | 3 refills | Status: AC | PRN

## 2024-03-02 MED ORDER — FREESTYLE LIBRE 3 READER DEVI: 1.0000 | Freq: Every day | 4 refills | Status: AC

## 2024-03-03 ENCOUNTER — Telehealth: Payer: Self-pay

## 2024-03-03 NOTE — Telephone Encounter (Signed)
 9:14a [name] with [facility] called asking for call back about concerns with a wound infection and medication adherence  [phone]

## 2024-03-07 ENCOUNTER — Other Ambulatory Visit: Payer: Self-pay | Admitting: Internal Medicine

## 2024-03-16 ENCOUNTER — Other Ambulatory Visit: Payer: Self-pay | Admitting: Internal Medicine

## 2024-03-16 MED ORDER — ACCU-CHEK GUIDE TEST VI STRP: 1.0000 | ORAL_STRIP | Freq: Three times a day (TID) | 12 refills | Status: AC

## 2024-03-16 MED ORDER — ACCU-CHEK GUIDE W/DEVICE KIT: 1.0000 | PACK | Freq: Every day | 0 refills | Status: AC

## 2024-03-16 MED ORDER — ACCU-CHEK SOFTCLIX LANCETS MISC: 1.0000 | Freq: Three times a day (TID) | 12 refills | Status: AC

## 2024-03-21 ENCOUNTER — Ambulatory Visit: Admitting: Internal Medicine

## 2024-03-29 ENCOUNTER — Other Ambulatory Visit: Payer: Self-pay

## 2024-03-29 ENCOUNTER — Other Ambulatory Visit: Payer: Self-pay | Admitting: Internal Medicine

## 2024-03-29 DIAGNOSIS — Z794 Long term (current) use of insulin: Secondary | ICD-10-CM

## 2024-03-29 MED ORDER — INSULIN PEN NEEDLE 32G X 6 MM MISC: 1.0000 | Freq: Every evening | 3 refills | Status: DC

## 2024-03-29 MED ORDER — FREESTYLE LIBRE 3 PLUS SENSOR MISC: 3 refills | Status: AC

## 2024-03-29 MED ORDER — INSULIN PEN NEEDLE 32G X 6 MM MISC: 1.0000 | Freq: Every evening | 3 refills | Status: AC

## 2024-03-29 MED ORDER — FREESTYLE LIBRE 3 PLUS SENSOR MISC: 3 refills | Status: DC
